# Patient Record
Sex: Female | Born: 1935
Health system: Southern US, Community
[De-identification: ages and names within clinical notes are randomized; demographics above are authoritative.]

## PROBLEM LIST (undated history)

## (undated) DIAGNOSIS — G9341 Metabolic encephalopathy: Secondary | ICD-10-CM

## (undated) DIAGNOSIS — G4700 Insomnia, unspecified: Secondary | ICD-10-CM

## (undated) DIAGNOSIS — E785 Hyperlipidemia, unspecified: Secondary | ICD-10-CM

## (undated) DIAGNOSIS — G309 Alzheimer's disease, unspecified: Secondary | ICD-10-CM

## (undated) DIAGNOSIS — N938 Other specified abnormal uterine and vaginal bleeding: Secondary | ICD-10-CM

## (undated) DIAGNOSIS — R531 Weakness: Secondary | ICD-10-CM

## (undated) DIAGNOSIS — N179 Acute kidney failure, unspecified: Secondary | ICD-10-CM

## (undated) DIAGNOSIS — N200 Calculus of kidney: Secondary | ICD-10-CM

## (undated) DIAGNOSIS — I10 Essential (primary) hypertension: Secondary | ICD-10-CM

## (undated) DIAGNOSIS — F0390 Unspecified dementia without behavioral disturbance: Secondary | ICD-10-CM

## (undated) DIAGNOSIS — D126 Benign neoplasm of colon, unspecified: Secondary | ICD-10-CM

## (undated) DIAGNOSIS — F0281 Dementia in other diseases classified elsewhere with behavioral disturbance: Secondary | ICD-10-CM

## (undated) DIAGNOSIS — E559 Vitamin D deficiency, unspecified: Secondary | ICD-10-CM

## (undated) DIAGNOSIS — D509 Iron deficiency anemia, unspecified: Secondary | ICD-10-CM

## (undated) DIAGNOSIS — F329 Major depressive disorder, single episode, unspecified: Secondary | ICD-10-CM

## (undated) DIAGNOSIS — E7800 Pure hypercholesterolemia, unspecified: Secondary | ICD-10-CM

## (undated) DIAGNOSIS — E079 Disorder of thyroid, unspecified: Secondary | ICD-10-CM

## (undated) DIAGNOSIS — G4730 Sleep apnea, unspecified: Secondary | ICD-10-CM

## (undated) DIAGNOSIS — F418 Other specified anxiety disorders: Secondary | ICD-10-CM

## (undated) DIAGNOSIS — G9340 Encephalopathy, unspecified: Secondary | ICD-10-CM

## (undated) HISTORY — DX: Major depressive disorder, single episode, unspecified: F32.9

## (undated) HISTORY — DX: Essential (primary) hypertension: I10

## (undated) HISTORY — DX: Benign neoplasm of colon, unspecified: D12.6

## (undated) HISTORY — DX: Unspecified dementia, unspecified severity, without behavioral disturbance, psychotic disturbance, mood disturbance, and anxiety: F03.90

## (undated) HISTORY — PX: APPENDECTOMY: SHX54

## (undated) HISTORY — DX: Pure hypercholesterolemia, unspecified: E78.00

## (undated) HISTORY — DX: Metabolic encephalopathy: G93.41

## (undated) HISTORY — DX: Vitamin D deficiency, unspecified: E55.9

## (undated) HISTORY — PX: CHOLECYSTECTOMY: SHX55

## (undated) HISTORY — PX: ABDOMINAL HYSTERECTOMY: SHX81

## (undated) HISTORY — DX: Encephalopathy, unspecified: G93.40

## (undated) HISTORY — DX: Other specified abnormal uterine and vaginal bleeding: N93.8

## (undated) HISTORY — DX: Dementia in other diseases classified elsewhere with behavioral disturbance: F02.81

## (undated) HISTORY — DX: Sleep apnea, unspecified: G47.30

## (undated) HISTORY — DX: Weakness: R53.1

## (undated) HISTORY — DX: Insomnia, unspecified: G47.00

## (undated) HISTORY — DX: Other specified anxiety disorders: F41.8

## (undated) HISTORY — DX: Disorder of thyroid, unspecified: E07.9

## (undated) HISTORY — DX: Hyperlipidemia, unspecified: E78.5

## (undated) HISTORY — PX: OTHER SURGICAL HISTORY: SHX169

## (undated) HISTORY — DX: Iron deficiency anemia, unspecified: D50.9

## (undated) HISTORY — DX: Acute kidney failure, unspecified: N17.9

## (undated) HISTORY — DX: Alzheimer's disease, unspecified: G30.9

## (undated) HISTORY — DX: Calculus of kidney: N20.0

---

## 1997-08-19 ENCOUNTER — Other Ambulatory Visit: Admission: RE | Admit: 1997-08-19 | Discharge: 1997-08-19 | Payer: Self-pay | Admitting: Obstetrics and Gynecology

## 1997-11-11 ENCOUNTER — Other Ambulatory Visit: Admission: RE | Admit: 1997-11-11 | Discharge: 1997-11-11 | Payer: Self-pay | Admitting: Obstetrics and Gynecology

## 1998-11-15 ENCOUNTER — Other Ambulatory Visit: Admission: RE | Admit: 1998-11-15 | Discharge: 1998-11-15 | Payer: Self-pay | Admitting: Obstetrics and Gynecology

## 1999-07-25 ENCOUNTER — Ambulatory Visit (HOSPITAL_COMMUNITY): Admission: RE | Admit: 1999-07-25 | Discharge: 1999-07-25 | Payer: Self-pay | Admitting: Gastroenterology

## 1999-10-23 ENCOUNTER — Encounter: Payer: Self-pay | Admitting: Obstetrics and Gynecology

## 1999-10-23 ENCOUNTER — Encounter: Admission: RE | Admit: 1999-10-23 | Discharge: 1999-10-23 | Payer: Self-pay | Admitting: Obstetrics and Gynecology

## 1999-11-15 ENCOUNTER — Other Ambulatory Visit: Admission: RE | Admit: 1999-11-15 | Discharge: 1999-11-15 | Payer: Self-pay | Admitting: Obstetrics and Gynecology

## 2000-10-06 ENCOUNTER — Emergency Department (HOSPITAL_COMMUNITY): Admission: EM | Admit: 2000-10-06 | Discharge: 2000-10-07 | Payer: Self-pay | Admitting: Emergency Medicine

## 2000-10-07 ENCOUNTER — Emergency Department (HOSPITAL_COMMUNITY): Admission: EM | Admit: 2000-10-07 | Discharge: 2000-10-07 | Payer: Self-pay | Admitting: Emergency Medicine

## 2000-10-07 ENCOUNTER — Encounter: Payer: Self-pay | Admitting: Emergency Medicine

## 2000-10-07 ENCOUNTER — Encounter: Payer: Self-pay | Admitting: Internal Medicine

## 2000-10-24 ENCOUNTER — Encounter: Admission: RE | Admit: 2000-10-24 | Discharge: 2000-10-24 | Payer: Self-pay | Admitting: Obstetrics and Gynecology

## 2000-10-24 ENCOUNTER — Encounter: Payer: Self-pay | Admitting: Obstetrics and Gynecology

## 2000-11-18 ENCOUNTER — Other Ambulatory Visit: Admission: RE | Admit: 2000-11-18 | Discharge: 2000-11-18 | Payer: Self-pay | Admitting: Obstetrics and Gynecology

## 2001-10-06 ENCOUNTER — Encounter: Payer: Self-pay | Admitting: Obstetrics and Gynecology

## 2001-10-06 ENCOUNTER — Encounter: Admission: RE | Admit: 2001-10-06 | Discharge: 2001-10-06 | Payer: Self-pay | Admitting: Obstetrics and Gynecology

## 2001-11-18 ENCOUNTER — Other Ambulatory Visit: Admission: RE | Admit: 2001-11-18 | Discharge: 2001-11-18 | Payer: Self-pay | Admitting: Obstetrics and Gynecology

## 2002-06-28 ENCOUNTER — Encounter: Payer: Self-pay | Admitting: Emergency Medicine

## 2002-06-28 ENCOUNTER — Emergency Department (HOSPITAL_COMMUNITY): Admission: EM | Admit: 2002-06-28 | Discharge: 2002-06-28 | Payer: Self-pay | Admitting: Emergency Medicine

## 2002-10-11 ENCOUNTER — Encounter: Admission: RE | Admit: 2002-10-11 | Discharge: 2002-10-11 | Payer: Self-pay | Admitting: Obstetrics and Gynecology

## 2002-10-11 ENCOUNTER — Encounter: Payer: Self-pay | Admitting: Obstetrics and Gynecology

## 2003-01-24 ENCOUNTER — Inpatient Hospital Stay (HOSPITAL_COMMUNITY): Admission: EM | Admit: 2003-01-24 | Discharge: 2003-01-26 | Payer: Self-pay | Admitting: Emergency Medicine

## 2003-04-22 ENCOUNTER — Emergency Department (HOSPITAL_COMMUNITY): Admission: EM | Admit: 2003-04-22 | Discharge: 2003-04-22 | Payer: Self-pay | Admitting: Emergency Medicine

## 2003-08-31 ENCOUNTER — Ambulatory Visit (HOSPITAL_COMMUNITY): Admission: RE | Admit: 2003-08-31 | Discharge: 2003-08-31 | Payer: Self-pay | Admitting: Gastroenterology

## 2003-10-13 ENCOUNTER — Ambulatory Visit (HOSPITAL_COMMUNITY): Admission: RE | Admit: 2003-10-13 | Discharge: 2003-10-13 | Payer: Self-pay | Admitting: Obstetrics and Gynecology

## 2003-11-23 ENCOUNTER — Other Ambulatory Visit: Admission: RE | Admit: 2003-11-23 | Discharge: 2003-11-23 | Payer: Self-pay | Admitting: Obstetrics and Gynecology

## 2004-06-28 ENCOUNTER — Encounter: Admission: RE | Admit: 2004-06-28 | Discharge: 2004-06-28 | Payer: Self-pay | Admitting: Gastroenterology

## 2004-07-04 ENCOUNTER — Encounter: Admission: RE | Admit: 2004-07-04 | Discharge: 2004-07-04 | Payer: Self-pay | Admitting: Gastroenterology

## 2004-07-13 ENCOUNTER — Encounter (INDEPENDENT_AMBULATORY_CARE_PROVIDER_SITE_OTHER): Payer: Self-pay | Admitting: Specialist

## 2004-07-13 ENCOUNTER — Ambulatory Visit (HOSPITAL_COMMUNITY): Admission: RE | Admit: 2004-07-13 | Discharge: 2004-07-13 | Payer: Self-pay | Admitting: Gastroenterology

## 2004-07-16 ENCOUNTER — Ambulatory Visit (HOSPITAL_COMMUNITY): Admission: RE | Admit: 2004-07-16 | Discharge: 2004-07-16 | Payer: Self-pay | Admitting: Gastroenterology

## 2004-07-31 ENCOUNTER — Inpatient Hospital Stay (HOSPITAL_COMMUNITY): Admission: EM | Admit: 2004-07-31 | Discharge: 2004-08-02 | Payer: Self-pay | Admitting: Emergency Medicine

## 2004-10-30 ENCOUNTER — Encounter: Admission: RE | Admit: 2004-10-30 | Discharge: 2004-10-30 | Payer: Self-pay | Admitting: Obstetrics and Gynecology

## 2004-12-27 ENCOUNTER — Inpatient Hospital Stay (HOSPITAL_COMMUNITY): Admission: AD | Admit: 2004-12-27 | Discharge: 2004-12-29 | Payer: Self-pay | Admitting: Gastroenterology

## 2005-01-23 ENCOUNTER — Inpatient Hospital Stay (HOSPITAL_COMMUNITY): Admission: EM | Admit: 2005-01-23 | Discharge: 2005-01-25 | Payer: Self-pay | Admitting: Emergency Medicine

## 2005-06-07 ENCOUNTER — Inpatient Hospital Stay (HOSPITAL_COMMUNITY): Admission: AD | Admit: 2005-06-07 | Discharge: 2005-06-16 | Payer: Self-pay | Admitting: Endocrinology

## 2005-06-16 ENCOUNTER — Ambulatory Visit: Payer: Self-pay | Admitting: Infectious Diseases

## 2005-11-01 ENCOUNTER — Encounter: Admission: RE | Admit: 2005-11-01 | Discharge: 2005-11-01 | Payer: Self-pay | Admitting: Obstetrics and Gynecology

## 2005-11-27 ENCOUNTER — Other Ambulatory Visit: Admission: RE | Admit: 2005-11-27 | Discharge: 2005-11-27 | Payer: Self-pay | Admitting: Obstetrics and Gynecology

## 2006-04-03 ENCOUNTER — Ambulatory Visit: Payer: Self-pay | Admitting: Internal Medicine

## 2006-10-10 ENCOUNTER — Ambulatory Visit (HOSPITAL_COMMUNITY): Admission: RE | Admit: 2006-10-10 | Discharge: 2006-10-10 | Payer: Self-pay | Admitting: Gastroenterology

## 2006-11-04 ENCOUNTER — Encounter: Admission: RE | Admit: 2006-11-04 | Discharge: 2006-11-04 | Payer: Self-pay | Admitting: Endocrinology

## 2006-11-07 ENCOUNTER — Encounter: Admission: RE | Admit: 2006-11-07 | Discharge: 2006-11-07 | Payer: Self-pay | Admitting: Endocrinology

## 2007-12-01 ENCOUNTER — Ambulatory Visit: Payer: Self-pay | Admitting: Obstetrics and Gynecology

## 2008-05-04 ENCOUNTER — Encounter: Payer: Self-pay | Admitting: Obstetrics and Gynecology

## 2008-05-04 ENCOUNTER — Other Ambulatory Visit: Admission: RE | Admit: 2008-05-04 | Discharge: 2008-05-04 | Payer: Self-pay | Admitting: Obstetrics and Gynecology

## 2008-05-04 ENCOUNTER — Ambulatory Visit: Payer: Self-pay | Admitting: Obstetrics and Gynecology

## 2008-11-23 ENCOUNTER — Ambulatory Visit: Payer: Self-pay | Admitting: Obstetrics and Gynecology

## 2009-05-08 ENCOUNTER — Ambulatory Visit: Payer: Self-pay | Admitting: Obstetrics and Gynecology

## 2010-02-18 ENCOUNTER — Encounter: Payer: Self-pay | Admitting: Obstetrics and Gynecology

## 2010-05-10 ENCOUNTER — Other Ambulatory Visit: Payer: Self-pay | Admitting: Obstetrics and Gynecology

## 2010-05-10 ENCOUNTER — Other Ambulatory Visit (HOSPITAL_COMMUNITY)
Admission: RE | Admit: 2010-05-10 | Discharge: 2010-05-10 | Disposition: A | Discharge status: Home / Self Care | Payer: Medicare Other | Source: Ambulatory Visit | Attending: Obstetrics and Gynecology | Admitting: Obstetrics and Gynecology

## 2010-05-10 ENCOUNTER — Encounter (INDEPENDENT_AMBULATORY_CARE_PROVIDER_SITE_OTHER): Payer: Medicare Other | Admitting: Obstetrics and Gynecology

## 2010-05-10 ENCOUNTER — Encounter: Payer: Self-pay | Admitting: Obstetrics and Gynecology

## 2010-05-10 DIAGNOSIS — Z124 Encounter for screening for malignant neoplasm of cervix: Secondary | ICD-10-CM

## 2010-05-10 DIAGNOSIS — R82998 Other abnormal findings in urine: Secondary | ICD-10-CM

## 2010-05-10 DIAGNOSIS — N952 Postmenopausal atrophic vaginitis: Secondary | ICD-10-CM

## 2010-05-10 DIAGNOSIS — N951 Menopausal and female climacteric states: Secondary | ICD-10-CM

## 2010-05-10 DIAGNOSIS — N6009 Solitary cyst of unspecified breast: Secondary | ICD-10-CM

## 2010-05-11 ENCOUNTER — Other Ambulatory Visit: Payer: Self-pay | Admitting: Obstetrics and Gynecology

## 2010-05-11 DIAGNOSIS — Z1231 Encounter for screening mammogram for malignant neoplasm of breast: Secondary | ICD-10-CM

## 2010-05-16 ENCOUNTER — Ambulatory Visit
Admission: RE | Admit: 2010-05-16 | Discharge: 2010-05-16 | Disposition: A | Discharge status: Home / Self Care | Payer: Medicare Other | Source: Ambulatory Visit | Attending: Obstetrics and Gynecology | Admitting: Obstetrics and Gynecology

## 2010-05-16 DIAGNOSIS — Z1231 Encounter for screening mammogram for malignant neoplasm of breast: Secondary | ICD-10-CM

## 2010-05-17 ENCOUNTER — Other Ambulatory Visit: Payer: Self-pay | Admitting: Obstetrics and Gynecology

## 2010-05-17 DIAGNOSIS — R928 Other abnormal and inconclusive findings on diagnostic imaging of breast: Secondary | ICD-10-CM

## 2010-05-22 ENCOUNTER — Ambulatory Visit
Admission: RE | Admit: 2010-05-22 | Discharge: 2010-05-22 | Disposition: A | Discharge status: Home / Self Care | Payer: Medicare Other | Source: Ambulatory Visit | Attending: Obstetrics and Gynecology | Admitting: Obstetrics and Gynecology

## 2010-05-22 DIAGNOSIS — R928 Other abnormal and inconclusive findings on diagnostic imaging of breast: Secondary | ICD-10-CM

## 2010-06-12 NOTE — Op Note (Signed)
[name], Brianna [name]               ACCOUNT NO.:  [id number]   MEDICAL RECORD NO.:  [id number]          PATIENT TYPE:  AMB   LOCATION:  ENDO                         FACILITY:  [facility]   PHYSICIAN:  Anselmo Rod, M.D.  DATE OF BIRTH:  Aug 15, 1932   DATE OF PROCEDURE:  10/10/2006  DATE OF DISCHARGE:                               OPERATIVE REPORT   PROCEDURE PERFORMED:  Screening colonoscopy.   ENDOSCOPIST:  Anselmo Rod, M.D.   INSTRUMENT USED:  Pentax video colonoscope.   INDICATIONS FOR PROCEDURE:  77 year old white female with a history of  adenomatous polyps removed in the past undergoing repeat colonoscopy to  rule out recurrent polyps.   PREPROCEDURE PREPARATION:  Informed consent was procured from the  patient.  The patient fasted for 8 hours prior to the procedure and  prepped with a bottle of magnesium citrate and a gallon of NuLytely the  night prior to the procedure.  The risks and benefits of the procedure  including a 10% miss rate of cancer and polyp were discussed with the  patient's family, as well.   PREPROCEDURE PHYSICAL:  The patient had stable vital signs.  Neck  supple.  Chest clear to auscultation.  S1 and S2 regular.  Abdomen soft  with normal bowel sounds.   DESCRIPTION OF PROCEDURE:  The patient was placed in the left lateral  decubitus position and sedated with 75 mcg of Fentanyl and 6 mg of  Versed given intravenously in slow incremental doses.  Once the patient  was adequately sedated and maintained on low flow oxygen and continuous  cardiac monitoring, the Pentax video colonoscope was advanced from the  rectum to the anastomosis where the patient had a right hemicolectomy in  the past. Sigmoid diverticulosis was noted.  There was some residual  stool in the colon.  Multiple washes were done. Internal hemorrhoids  were seen on retroflexion. The distal small bowel appeared normal.  No  masses or polyps were identified.  The patient tolerated the  procedure  well without complication.   IMPRESSION:  1. Small internal hemorrhoids.  2. Sigmoid diverticulosis.  3. Patient status post right hemicolectomy.  4. Significant amount of residual stool in the colon.  Small lesions      could have been missed.   RECOMMENDATIONS.:  1. Continue liberal fluid intake with meals.  2. Repeat colonoscopy is recommend in the next five years.  Further      recommendation to be made in follow-up.      Anselmo Rod, M.D.  Electronically Signed     JNM/MEDQ  D:  10/10/2006  T:  10/11/2006  Job:  [id number]   cc:   Jeannett Senior A. Evlyn Kanner, M.D.  Fax: [phone]

## 2010-06-15 NOTE — H&P (Signed)
NAME:  Brianna [name], Brianna [name]                   ACCOUNT NO.:  mk   MEDICAL RECORD NO.:  [id number]          PATIENT TYPE:  EMS   LOCATION:  ED                           FACILITY:  [facility]   PHYSICIAN:  Barry Dienes. Eloise Harman, M.D.DATE OF BIRTH:  1932-06-30   DATE OF ADMISSION:  07/31/2004  DATE OF DISCHARGE:                                HISTORY & PHYSICAL   CHIEF COMPLAINT:  Severe diarrhea.   HISTORY OF PRESENT ILLNESS:  The patient is a 77 year old white female with  a long history of GI problems.  In June 2006, she had a work-up for nausea  which showed markedly delayed gastric emptying and Helicobacter pylori  serology positivity.  She started antibiotic treatment last week for the  Helicobacter pylori.  Approximately five days ago she awoke with some  confusion.  She has also complained of bilateral ankle edema without  shortness of breath.  After recently being started on Sular on July 3, she  was started on a diuretic.  Later on July 3, she began to have multiple  episodes of vomiting and diarrhea without fever.  Today she has had profuse  watery diarrhea every few minutes.  She has been treated with IV fluids and  Zofran 4 mg IV by emergency room staff.  Currently she complains of a dry  cough and no longer has any nausea.  She continues to have loose bowel  movements every few minutes.   PAST MEDICAL HISTORY:  1.  In 2006, upper GI studies showed esophageal dysmotility.  2.  She also had a July 13, 2004 EGD that showed antral gastritis.  3.  A June 19 gastric emptying study showed markedly delayed gastric      emptying with 85% of activity present at two hours.  4.  An August 2005 colonoscopy showed minimal diverticulosis with internal      hemorrhoids.  5.  She is status post approximately 1998 right hemicolectomy for a large      villous adenoma.  6.  In December 2004, she had a partial small bowel obstruction and      apparently has had multiple minor bouts of partial small  bowel      obstruction.  7.  She also has hypothyroidism, hyperlipidemia and hypertension.   MEDICATIONS PRIOR TO ADMISSION:  1.  Effexor 75 mg p.o. daily.  2.  Synthroid 175 mcg p.o. daily.  3.  Diovan 320 mg p.o. daily.  4.  Nexium 40 mg p.o. daily.  5.  Hydrochlorothiazide 12.5 mg p.o. q.o.d.  6.  Phenergan 25 mg p.o. daily p.r.n. nausea.  7.  Sular 10 mg p.o. daily.  8.  Vytorin 10/80 one tablet p.o. b.i.d.  9.  Reglan p.r.n. nausea.   PAST SURGICAL HISTORY:  1.  Remote laparoscopic cholecystectomy.  2.  Total abdominal hysterectomy and bilateral salpingo-oophorectomy.  3.  Right renal calculus, open removal.  4.  In 1998, right hemicolectomy.   ALLERGIES:  1.  PENICILLIN has been associated with edema.  2.  ACE INHIBITORS have been associated with dry cough.  FAMILY HISTORY:  Father died at age 11 of complications of COPD.  Mother  died at age 58 of a stroke.  She also had diabetes mellitus, type 2.  She  had six brothers and sisters who have passed away.  Two had lung cancer and  one had diabetes mellitus.  There is no family history of close relatives  with colon cancer or breast cancer.  There is a family history of premature  cardiovascular disease.   SOCIAL HISTORY:  She is married.  She has three daughters and one adopted  grandson.  Her daughter lives next door.  She has no history of tobacco or  alcohol abuse.   REVIEW OF SYSTEMS:  She has a dry cough today.  She has had some nausea and  vomiting and severe diarrhea.  She denies recent fever or chills, dysuria,  frequency.  She has osteoarthritis that affects her hands, knees and ankles.  Currently she is without symptoms of depression or anxiety.   PHYSICAL EXAMINATION:   REVIEW OF SYSTEMS:  She is chronically bed bound and has a significant fear  of falling although she has had no recent falls with injury.  She has  bilateral knee osteoarthritis pain.  She has chronic intermittent oozing  from her left  thigh ulcerated varicose veins.  She is an oxygen requiring  person with COPD.  She has no recent substernal chest pain, nausea,  vomiting, constipation, or fever.  She is currently without significant  depression or anxiety.   PHYSICAL EXAMINATION:  VITAL SIGNS:  Blood pressure 132/63, pulse 116,  respirations 20, temperature 99.5, pulse oximetry 95% on room air.  GENERAL:  She is an overweight white female who had a dry cough while lying  at 10-degree elevation of the head of the bed.  HEENT:  Significant for bilateral cerumen impaction.  NECK:  Supple without JVD or carotid bruits.  CHEST:  Clear to auscultation.  HEART:  Regular rate and rhythm without murmur or gallop.  ABDOMEN:  Normal bowel sounds with no hepatosplenomegaly or tenderness.  There are well-healed surgical scars in the right flank and along the entire  midline.  EXTREMITIES:  Without cyanosis, clubbing, or edema.  NEUROLOGIC:  Nonfocal.   LABORATORY DATA:  CT scan of the abdomen and pelvis with oral and IV  contrast showed the following:  1.  Fatty liver.  2.  Unchanged small cyst of the right kidney.  3.  Minimally prominent loops of jejunum in the left upper quadrant with      normal dimension distal small bowel loops.  4.  Contrast does pass into the distal ileum.  There is no contrast seen in      the colon.   White blood cell count was 17, hemoglobin 12, hematocrit 37.5, 13.6  neutrophils, platelets 340.  Serum sodium 134, potassium 4.1, chloride 97,  CO2 23, BUN 12, creatinine 1.1, glucose 133.  Stool hemoccult test was  negative.   IMPRESSION AND PLAN:  1.  Diarrhea:  This is likely secondary to Clostridium difficile colitis      given her recent antibiotic treatment and elevated white blood cell      count and low grade fever.  A viral syndrome is somewhat less likely.  I     plan to continue IV fluids at a moderate rate and empiric treatment with      Flagyl 250 mg four times daily for 14 days.   Clostridium difficile stool  test has already been sent from the emergency room.  2.  Hypertension:  Well controlled despite no medications for the past two      days due to her nausea and diarrhea.  We will restart her Diovan and      hold her Sular for now due to her relatively normal blood pressure.  3.  Gastroparesis:  Moderate by recent gastric emptying study.  I plan to      have Reglan restarted when her diarrhea subsides.       DGP/MEDQ  D:  07/31/2004  T:  07/31/2004  Job:  [id number]   cc:   Jeannett Senior A. Evlyn Kanner, M.D.  [address]  [place]  [place] [address]  Fax: [phone]   Anselmo Rod, M.D.  [address]  [address]  [place]  [place] [address]  Fax: [phone]   Rande Brunt. Eda Paschal, M.D.  [address], [address]  [place]  [place] [address]  Fax: [phone]

## 2010-06-15 NOTE — Discharge Summary (Signed)
Brianna [name], Brianna [name]               ACCOUNT NO.:  [id number]   MEDICAL RECORD NO.:  [id number]          PATIENT TYPE:  INP   LOCATION:  1507                         FACILITY:  [facility]   PHYSICIAN:  Tera Mater. Evlyn Kanner, M.D. DATE OF BIRTH:  April 12, 1932   DATE OF ADMISSION:  07/31/2004  DATE OF DISCHARGE:  08/02/2004                                 DISCHARGE SUMMARY   DISCHARGE DIAGNOSES:  1.  Profuse diarrhea with volume depletion, clinically improved with      negative Clostridium difficile.  2.  Recent finding of gastritis still with some symptoms.  3.  Gastric emptying delay.  4.  History of diverticular disease.  5.  Prior small bowel obstruction.  6.  Hypothyroidism.  7.  Hypertension.  8.  Hyperlipidemia.  9.  Depression.   CONSULTATIONS:  Dr. Anselmo Rod for GI.   PROCEDURES:  None.   HISTORY OF PRESENT ILLNESS:  Brianna [name] is a 77 year old white female with  a long history of multiple small bowel obstruction and difficulties with her  bowels, who presented initially with nausea and vomiting and then profuse  diarrhea.  She had recently been treated for H. pylori and there was a worry  that we might have had an upset in the bowel with colonic bacteria that  caused Clostridium difficile to predominant.  Fortunately this has not been  show to be true.  Initially she did have some fever and leukocytosis,  therefore requiring admission.  The patient has actually done quite well  under observation.  With hydration and limitation of diet plus Citrucel for  bulking agent, she has done quite well.  She has had her Reglan held, which  has also helped, and was given some Simethicone.  At the present time her  bowels have become more formed.  Her last diarrheal stool was yesterday  morning and she has had no fever overnight.  She is now discharged home in  improved condition.   DIET:  As before with no significant limitations.   MEDICATIONS AT DISCHARGE:  Will include:  1.   Over-the-counter Simethicone on a p.r.n. basis.  2.  Citrucel at least one packet a day.  She is to resume her other medications which include:  1.  Effexor XR 75 daily.  2.  Synthroid 175 mcg daily.  3.  Diovan 320 daily.  4.  Nexium 40 daily.  5.  HCTZ 12.5 every other day.  6.  Phenergan p.r.n.  7.  Sular 10 daily.  8.  Vytorin 10/80 one daily.   FOLLOW UP:  With me in two-three weeks.  She is to see Dr. Loreta Ave as discussed  with her.  She is to call if she gets other diarrheal problems.   LABORATORY REVIEW:  This morning white count is 8200, hemoglobin 10.3,  platelets 270,000, MCV 87.6.  Chemistries today sodium 138, potassium 4.2,  chloride 106, CO2 of 25, BUN 4, creatinine 0.9, glucose 110.  Total  bilirubin is 0.5, alkaline phosphatase is 48, SGOT 40, SGPT 31, total  protein 6.0, albumin 2.8, calcium 8.8.  Culture of the  blood yesterday was  negative.  At presentation sodium 134, potassium 4.1, chloride 97, CO2 of  23, BUN 12, creatinine 1.1, glucose 133.  Initial white count was 17,300,  hemoglobin 12.7, platelets 346,000.  A C. difficile on July 31, 2004 was  negative.  Stool culture is pending.  Radiology testing on August 01, 2004 a  chest x-ray showed some hyper aeration of the lung bases and an acute  abdomen with no evidence of obstruction or perforation.  A CT of the abdomen  done in the ER on July 31, 2004 showed dilated small bowel loops in the left  upper quadrant, which could represent a focal ileus or partial small bowel  obstruction, though contrast does pass beyond the normal caliber of the  distal small loops.  Mild fatty infiltration of the liver.  A CT of the  pelvis with no acute pelvic abnormalities were noted.   SUMMARY:  We have a 77 year old white female with a complicated GI history  presenting with initially nausea and vomiting and then onto a diarrheal  illness.  She had profuse watery diarrhea that has now resolved.  This is  probably viral in nature and  not related to bacterial overgrowth or other  difficulties.  Treatment is noted as above.       SAS/MEDQ  D:  08/02/2004  T:  08/02/2004  Job:  [id number]

## 2010-06-15 NOTE — Discharge Summary (Signed)
Brianna [name], Brianna [name]               ACCOUNT NO.:  [id number]   MEDICAL RECORD NO.:  [id number]          PATIENT TYPE:  INP   LOCATION:  5740                         FACILITY:  [facility]   PHYSICIAN:  Tera Mater. Evlyn Kanner, M.D. DATE OF BIRTH:  04/25/1932   DATE OF ADMISSION:  12/27/2004  DATE OF DISCHARGE:  12/29/2004                                 DISCHARGE SUMMARY   DISCHARGE DIAGNOSES:  1.  Nausea, vomiting, gastroparesis and dehydration with hypokalemia,      hyponatremia.  2.  Hypertension.  3.  Hyperlipidemia.  4.  Hypothyroidism.   CONSULTATIONS:  None.   PROCEDURES:  There were no procedures.   [name] is a 77 year old white female, a patient of my practice for many  years.  She has had recurrent problems with GI issues, primarily  gastroparesis.  She had recently undergone evaluation by Dr. Loreta Ave.  She  presented to see Dr. Loreta Ave as an outpatient and had electrolyte disturbances  as well as continued poor p.o. intake and was brought to the hospital for  stabilization.  She just had a CT of the abdomen.  This was not repeated.  She has had an upper GI, and this was not repeated.  The patient was  initially given gentle hydration.  Electrolytes were rechecked and the  patient came back into balance relatively easily.  Diet was slowly advanced  and Reglan was given on a regular basis.  The patient has had trouble  tolerating this on an outpatient basis.  Proton pump inhibitors also were  continued.  The patient did improve with the treatment of gastroparesis.  It  was emphasized that this was a cornerstone of her therapy and we recommended  continuing this therapy on a regular basis.  The patient exhibited good  understanding and was back to a relatively stable baseline and was  discharged by my partner, Dr. Clelia Croft.   LABORATORY DATA:  White count initially was 13,000, hemoglobin 12.2, MCV  85.6, platelets 334,000.  White count on the 1st was 9.3, hemoglobin 11.8,  platelets  232,000.  Sodium was initially 124, potassium 3, chloride 83, CO2  29, BUN 6, creatinine 0.8, glucose 118, calcium 9.5.  A repeat on the 2nd,  sodium 131, potassium 3.2, chloride 94, CO2 29, BUN 5, creatinine 0.8,  glucose 89.  Liver function tests were normal with an AST of 23, ALT of 19,  alkaline phosphatase 78, total bilirubin 0.7.  Radiology reports do not  appear to have caught up with the chart yet.   In summary, we have a 77 year old white female with known gastroparesis in  the setting of exacerbation with electrolyte disturbances.  Fortunately, she  got better with fluids and conservative therapy.   DISCHARGE MEDICATIONS:  1.  Reglan 10 mg before each meal and at bedtime.  2.  Metamucil daily.  3.  Synthroid 175 mcg daily.  4.  Sular 10 mg daily.  5.  Nexium 40 mg daily.  6.  Diovan/HCT 320/12.5 mg.  7.  Vytorin now has been stopped.  8.  Effexor XR 75 mg daily.  9.  Phenergan as needed.  10. Potassium 20 mEq twice a day.   She was to see me in a week to 10 days.  She had an appointment with Dr.  Loreta Ave on Tuesday, December 5.  She is to call if she has recurrent trouble.           ______________________________  Tera Mater. Evlyn Kanner, M.D.     SAS/MEDQ  D:  02/12/2005  T:  02/12/2005  Job:  [id number]

## 2010-06-15 NOTE — Discharge Summary (Signed)
NAME:  Brianna [name], Brianna [name]                         ACCOUNT NO.:  [id number]   MEDICAL RECORD NO.:  [id number]                   PATIENT TYPE:  INP   LOCATION:  5734                                 FACILITY:  [facility]   PHYSICIAN:  Tera Mater. Evlyn Kanner, M.D.              DATE OF BIRTH:  1932-04-18   DATE OF ADMISSION:  01/24/2003  DATE OF DISCHARGE:  01/26/2003                                 DISCHARGE SUMMARY   DISCHARGE DIAGNOSES:  1. Partial small-bowel obstruction, without mechanical obstruction,     clinically improved.  2. Primary hypothyroidism.  3. Hyperlipidemia.  4. Hypertension.  5. Prior multiple abdominal surgeries.   PROCEDURE:  CT of the abdomen and pelvis and an upper GI series with small-  bowel follow through.   HISTORY:  Ms. Shibuya is a 77 year old white female with prior partial colon  resection for benign reasons, prior hysterectomy, cholecystectomy, and  surgical repair of renal calculi. She presented with nausea, vomiting, and a  clinical picture consistent with small-bowel obstruction. Fortunately, this  has resolved with fairly noninterventional methods with bowel rest, slow  progression back with diet, and she is now clinically much improved. She has  not had any evidence of infectious cause of this. There is no evidence of  perforation. The small-bowel follow through does not show a transition area  suggesting an area that needs anything surgical. The patient has settled  back to her clinical baseline. She is tolerating her diet. Bowels have  worked. She has had no fever. She did initially have some mild leukocytosis  that has completely normalized. Electrolytes have stayed in good control.  She has had no decompensation of pulmonary, cardiac, or other systems.  Overall, she is markedly improved and ready for discharge home.   LABORATORY DATA:  White count started at 21,700 with a hemoglobin of 15.1,  MCV of 87.0, platelet count 360,000; white count normalized to  7,800 with  hemoglobin of 11.3, MCV of 88.2, platelet count 259,000. Initial chemistries  showed sodium 133, potassium 4.7, chloride 100, BUN 20, creatinine 1.3,  glucose 166. This normalized to sodium 137, potassium 3.5, chloride 108, CO2  26, BUN 12, creatinine 0.9, glucose 94. Liver function testing was normal  with a total bilirubin of 0.7, direct bilirubin of 0.2, indirect bilirubin  of 0.5, alkaline phosphatase 65, SGOT 32, SGPT 26, total protein 8.4,  albumin 4.2, calcium 8.0. Urinalysis shows specific gravity of 1.034 with a  pH of 6, otherwise negative. Radiology testing included an upper GI with  small-bowel follow through with the double contrasting image of the stomach  was unremarkable; duodenal bulb was normal position without stricture,  ulceration, or full thickening; C loop of the duodenum had normal  configuration, nonspecific esophageal motility disorder was noted; otherwise  unremarkable upper GI. The distal proximal small bowel loops and the jejunum  with decompressed distal loops were seen. There was dilated proximal small  loops in the jejunum with decompressed distal loops seen. There was evidence  of adhesions in the left upper quadrant although the discrete transition  zone cannot be identified. The imaging features are compatible with partial  small-bowel obstruction, and the contrast passed readily to the colon in a  patient status post right hemicolectomy. CT of the abdomen and the pelvis on  December 27 showed mild partial small-bowel obstruction; cause is not seen.  There was a 9-mm simple right renal cyst, and she was status post  cholecystectomy. The pelvis showed sigmoid diverticulosis without  diverticulitis.   SUMMARY:  In summary, we have a 77 year old white female with multiple prior  GI surgeries presenting now with a partial small-bowel obstruction. She has  resolved nicely with basic bowel rest and is ready for discharge home. She  does have a risk  of recurrence due to the presence of adhesions, but she is  presently doing well. Medications will be to resume her Zocor plus Zetia-40  and 10 mg respectively, her Levothroid at 175 mcg daily, Diovan 320 daily,  and her hormone shots from Dr. Eda Paschal. She is to follow up with [place] in two  to three weeks. She is to call if she has recurrent nausea, vomiting, or  fever. She will not be on antibiotics. No dietary restrictions are noted. No  pain control is required.                                                Tera Mater. Evlyn Kanner, M.D.    SAS/MEDQ  D:  01/26/2003  T:  01/26/2003  Job:  [id number]

## 2010-06-15 NOTE — Assessment & Plan Note (Signed)
Brianna [name]                         GASTROENTEROLOGY OFFICE NOTE   Brianna [name], Brianna [name]                      MRN:          [id number]  DATE:04/03/2006                            DOB:          05-Aug-1932    REASON FOR CONSULTATION:  Nausea.   This is a pleasant 77 year old white female with a history of  hypertension, hyperlipidemia, hypothyroidism, depression,  osteoarthritis, sleep apnea, kidney stones, obesity, and multiple prior  surgeries, who presents today regarding chronic nausea.  The patient has  had this problem for years, and has had extensive GI evaluation with Dr.  Charna Elizabeth.  Dr. Loreta Ave has performed colonoscopy in 2001, and again in  2005.  Upper endoscopy was performed in 2006, and found to be  unrevealing.  She has an abnormal gastric emptying scan consistent with  gastroparesis.  She failed standard therapies, and thus, was referred to  Dr. Alycia Rossetti regarding possible other treatments including gastric pacing  and / or the use of commercially unavailable promotility agents.  Patient was seen last year, though due to some personal issues, found  herself lost the followup.  There are really no new features to her  problems.  Her symptoms persist at this time despite b.i.d. Nexium and  t.i.d. Phenergan.   PAST MEDICAL HISTORY:  As above.   PAST SURGICAL HISTORY:  1. Cholecystectomy.  2. Hysterectomy.  3. Appendectomy.   CURRENT MEDICATIONS:  Diovan, Sular, Levothroid, Nexium, venlafaxine,  Vytorin, Phenergan, hydrocodone/APAP, and Enjuvia.   FAMILY HISTORY:  Siblings and mother with diabetes.   SOCIAL HISTORY:  Patient is married with 4 children.  She is accompanied  by 1 daughter.  She lives with her husband.  She graduated from the 8th  grade.  She does not smoke or use alcohol.   REVIEW OF SYSTEMS:  Per diagnostic evaluation form.   PHYSICAL EXAMINATION:  Well-appearing female, in no acute distress.  Blood pressure is  124/78.  Heart rate is 88.  Weight is 196.2 pounds.  She is 5 feet 1 inch in height.  HEENT:  Sclerae anicteric.  Conjunctivae are pink.  Oral mucosa is  intact.  There is no thrush.  LUNGS:  Clear.  HEART:  Regular.  ABDOMEN:  Obese and soft without tenderness, mass, or hernia.  No  obvious succussion splash.  EXTREMITIES:  Without edema.   IMPRESSION:  This is a 77 year old female with multiple medical problems  who has chronic nausea secondary to gastroparesis.  She has had previous  extensive GI evaluations in [place] and at the expert center at  [facility].  At this point, I have encouraged her and her daughter to  return to Dr. Alycia Rossetti at [facility] to complete her evaluation.  Furthermore, they have medical and nonmedical treatment options not  currently available in [place].  They understand, and appreciate this  recommendation.     Wilhemina Bonito. Marina Goodell, MD  Electronically Signed    JNP/MedQ  DD: 04/17/2006  DT: 04/17/2006  Job #: [id number]   cc:   Jeannett Senior A. Evlyn Kanner, M.D.  Beverly Gust, MD

## 2010-06-15 NOTE — H&P (Signed)
[name], Brianna [name]               ACCOUNT NO.:  [id number]   MEDICAL RECORD NO.:  [id number]          PATIENT TYPE:  INP   LOCATION:  5740                         FACILITY:  [facility]   PHYSICIAN:  Brianna Mater. Saint [name], M.D. DATE OF BIRTH:  01-01-1933   DATE OF ADMISSION:  12/27/2004  DATE OF DISCHARGE:                                HISTORY & PHYSICAL   HISTORY OF PRESENT ILLNESS:  Brianna Brianna [name] is a 77 year old white female, long-  standing patient of my practice, with a known history of hypertension,  hyperlipidemia, hypothyroidism, and significant bowel complaints with known  reflux and significant gastroparesis.  She began feeling bad a bit before  Thanksgiving and really has not had much solids to eat in the last six days.  Even liquids have been vomited back up.  She has had no abdominal pain.  She  has had some gastrointestinal burning at times.  Her bowels have been  working normally with no diarrhea.  She has had no fevers, chills, or  sweats.  She has had some anorexia and bloating.  She has had recent testing  showing 100% retention at two hours on a gastric emptying study, and has had  prior surgery with prior partial small bowel obstruction and colon resection  revealing benign pathology.  The patient has not been taking her Reglan on a  regular basis as recommended by the gastroenterologist.  She was seen as an  outpatient today for evaluation, and due to electrolyte disorders and  general failure to thrive, is brought into the hospital today for further  treatment.   REVIEW OF SYSTEMS:  She denies any breathing trouble, no chest pains, no  fevers, chills, or sweats.  No arthritis complaints of note.  No neurologic  complaints.   PAST MEDICAL HISTORY:  1.  Hypertension.  2.  Hyperlipidemia.  3.  Hypothyroidism.  4.  Nephrolithiasis.  5.  Partial small bowel obstruction.  Colonoscopy was normal in 2005.  6.  Sleep apnea diagnosed in 2005.  7.  Benign colon resection in the  past.  8.  Total abdominal hysterectomy.  9.  Cholecystectomy.  10. Kidney stone extraction.   MEDICATIONS:  1.  Levothroid 175 mcg daily.  2.  Diovan 320 mg daily.  3.  HCTZ 12.5 mg daily.  4.  Vytorin 10/40 mg daily.  5.  Vitamins daily.  6.  Effexor XR 75 mg daily.  7.  Promethazine p.r.n.  8.  Nexium 40 mg b.i.d.  9.  Sular 10 mg daily.   ALLERGIES:  1.  PENICILLIN.  2.  ACE INHIBITORS.   SOCIAL HISTORY:  She is married.  She has several children and  grandchildren.   PHYSICAL EXAMINATION:  VITAL SIGNS:  Blood pressure 135/66, pulse 83,  respirations 18, temperature 98.2, O2 saturation is 95% on room air.  GENERAL:  We have a fairly healthy-appearing white female.  It looks like  she feels moderately unwell.  HEENT:  Sclerae anicteric.  Face is symmetrical.  Mucous membranes are  moist.  No oral lesions are present.  No evidence of swelling or obstruction  is noted.  NECK:  Supple with no JVD, bruits, or thyromegaly.  LUNGS:  Clear without wheezes, rhonchi, or rales.  She is moving air well  with no accessory muscles use.  HEART:  Regular and somewhat distant with no murmurs appreciated.  ABDOMEN:  Mildly distended, soft, with no discrete tenderness.  It is  somewhat tympanitic with hypoactive bowel sounds.  No rushes or tinkles, or  other evidence of obstruction are noted.  EXTREMITIES:  Strong distal pulses and no peripheral edema.  ___________perfusion is good.  NEUROLOGIC:  The patient is awake, alert, her mentation is good.  Speech is  clear.  No resting tremors present.  No evidence of hallucinations or  delusions are present.   LABORATORY DATA:  Sodium is 124, potassium is 3, chloride 83, CO2 of 29, BUN  6, creatinine 0.8, calcium 9.5, glucose 118.  Additional studies are  pending.  A CT scan of the abdomen and pelvis was done on July 4, showing  diffuse fatty infiltration of the liver, dilated small bowel loops in the  left upper quadrant, thought to be a  possible focal ileus, the colon was not  opacified, no acute pelvic abnormalities were noted.  Gastric emptying study  on July 19, was noted to show delayed gastric emptying as noted above.  An  upper GI on July 7, showed esophageal dysmotility which was an old finding.   In summary, we have a 77 year old white female with known gastroparesis and  gastrointestinal surgeries presenting now with nausea, vomiting, and  electrolyte disorders.  Fortunately, she has not had any neurologic  compromise due to this.  She is being hydrated and electrolytes are being  replaced.  We are going to try some small dose intravenous Reglan and  continue proton pump inhibitor therapy and recheck her electrolytes at a  later point.  There is no evidence for hormonal cause for this.  A recent  TSH in my office was around 3, so this is not a hypothyroid problem either.  There is no evidence of any infectious source.  Labs and x-rays are pending  otherwise.           ______________________________  Brianna Mater. Brianna Brianna [name], M.D.     SAS/MEDQ  D:  12/27/2004  T:  12/27/2004  Job:  [id number]

## 2010-06-15 NOTE — H&P (Signed)
NAMELATOY, LABRIOLA               ACCOUNT NO.:  [id number]   MEDICAL RECORD NO.:  [id number]          PATIENT TYPE:  INP   LOCATION:  1612                         FACILITY:  [facility]   PHYSICIAN:  Tera Mater. Evlyn Kanner, M.D. DATE OF BIRTH:  [birth year]   DATE OF ADMISSION:  06/07/2005  DATE OF DISCHARGE:                                HISTORY & PHYSICAL   Ms. Zachow is a 77 year old white female well-known to me from multiple  visits in the office with a history of hypothyroidism, chronic GI  complaints, hyperlipidemia.  She has been in a failure to thrive mode with  basically progressively worse, staying in bed, continues disoriented and  perhaps excessively using Zofran and Tylenol PM.  She has lost 11 pounds in  just a couple of weeks and basically has not gotten dressed or gotten out of  bed hardly at all in two weeks.  She has near constant nausea.  Complained  of headaches and some loose stools.  She has had multiple evaluations both  GI here, [facility] for GI and by neurology with a feeling that  she might have had some depression.  Clearly, she is worse and I suspect  electrolyte problems and other issues predominating.  She may have a  progressive form of dementia or just a vegetated depression.   PAST MEDICAL HISTORY:  1.  GI work-up with normal colonoscopy in 2005, possible small bowel      obstruction, abnormal gastric emptying with so-called tachygastria.  2.  History of sleep apnea.  3.  Nephrolithiasis.  4.  Hypothyroidism.  5.  Hyperlipidemia.  6.  Hypertension.  7.  Non-alcoholic fatty liver disease.  8.  H. pylori previously treated.  9.  She has had a laparoscopic cholecystectomy, total abdominal      hysterectomy, partial resection of her colon, and kidney stone      extraction.   FAMILY HISTORY:  Her father died at 52 of heart failure and asthma.  Mother  died at 46 of diabetes and heart failure.  Brother died of COPD.  She is  married.  Three  children and three grandchildren.   ALLERGIES:  PENICILLIN and ACE INHIBITORS which cause a cough.   MEDICATIONS:  1.  Levothroid 175.  2.  Diovan 320.  3.  HCTZ 25.  4.  Nexium 40 twice daily.  5.  Sular 10.  6.  Effexor XR 150 up from 75 recently.  7.  Zofran 4 mg p.r.n.   PHYSICAL EXAMINATION:  VITAL SIGNS:  Weight 167 reflecting a documented 11  pounds weight loss, blood pressure 122/68, pulse 76.  GENERAL:  Frail, disheveled white female making minimal eye contact sitting  in a wheelchair.  HEENT:  Sclerae appear anicteric.  There is no lid lag or exophthalmus.  There is no nystagmus present.  Oral mucous membranes still appear to be  moist.  No oral lesions are present.  NECK:  Supple.  No JVD is present.  No bruits are present.  No thyroid  enlargement is present.  LUNGS:  Clear without wheezes, rales, or rhonchi.  No excessive muscle use.  HEART:  Regular.  No murmurs appreciated.  ABDOMEN:  Soft, nontender with no masses or hepatosplenomegaly.  Bowel  sounds normoactive.  EXTREMITIES:  Strong distal pulses are present with no peripheral edema.  Nail bed perfusion is good.  NEUROLOGIC:  She has psychomotor retardation, poor eye contact, mildly  confused.  She does have a tremor of the outstretched hands.  No evidence of  acute hallucinations are present.  She is unable to tell me the situation of  what is going on.  She interacts little and her family answers for her  primarily.  SKIN:  Warm and dry with no rashes.  Some senile keratoses are present.   SUMMARY:  We have a 77 year old white female clearly in decline with failure  to thrive, confusion, GI complaints.  I suspect she may have worsening of  her pre-existing hyponatremia perhaps with some other electrolyte issues.  This may just be major depression, perhaps just some psychosis or so-called  agitated depression.  Clearly, there is some element of medication over use.  The rapid weight loss if worrisome for  possible malignant causes.  There is  no evidence of infectious cause of this at the present time.  We are going  to admit her to the hospital, laboratories, IV fluids, bed rest, psychiatry  consult and see where all of this takes [place] on a clinical course.           ______________________________  Tera Mater. Evlyn Kanner, M.D.     SAS/MEDQ  D:  06/14/2005  T:  06/14/2005  Job:  [id number]

## 2010-06-15 NOTE — Discharge Summary (Signed)
Brianna [name], Brianna [name] NO.:  [id number]   MEDICAL RECORD NO.:  [id number]          PATIENT TYPE:  INP   LOCATION:  6712                         FACILITY:  [facility]   PHYSICIAN:  Tera Mater. Evlyn Kanner, M.D. DATE OF BIRTH:  29-Mar-1932   DATE OF ADMISSION:  01/23/2005  DATE OF DISCHARGE:  01/25/2005                                 DISCHARGE SUMMARY   DISCHARGE DIAGNOSES:  1.  Intractable nausea and vomiting secondary to gastroparesis, clinically      much improved.  2.  Volume depletion, improved after hydration.  3.  Mild hyponatremia, improved.  4.  Mild leukocytosis, resolved.  5.  Diarrhea.  No recurrence.  6.  Hypertension, stable.  7.  Hypothyroidism, clinically euthyroid.  8.  Mild hyperglycemia, with probable impaired glucose tolerance by      laboratories.  9.  Obstructive sleep apnea.  10. History of kidney stones.  11. Hyperlipidemia.   CONSULTATIONS:  Jordan Hawks. Elnoria Howard, M.D. of gastroenterology.   [name] is a 77 year old white female with known history of  gastroparesis, recurrent nausea and vomiting, who returned with a repeat  bout.  Obstruction was ruled out, and the patient was treated with IV Reglan  and hydration.  She was then transitioned over to oral medications, and this  morning her diet was advanced, and she did well.  She has no abdominal pain.  X-rays have resolved.  She did have mild hyperglycemia at presentation, but  has a perfectly normal A1c.  Her mild hyponatremia has also improved and  nearly resolved without clinical sequelae at the present time.  She feels  well and is ready to go home.  She did have some excess bowel movements on  presentation, and those have resolved.   LABORATORY DATA:  Today, white count is 8,400, hemoglobin 11.3, MCV 87.2,  platelets 297,000.  Chemistries:  Sodium 133, potassium 3.8, chloride 103,  CO2 25, BUN 3, creatinine 0.8, glucose 85, calcium 8.6.  Hemoglobin A1c on  January 23, 2005 was 5.4%.  TSH  was 1.486.  Urinalysis was yellow, hazy,  with 3-6 WBCs and 30 mg of protein.  No sugar was seen.  No cultures were  done.   RADIOLOGY TESTING:  An abdominal x-ray yesterday was no acute disease.  On  January 23, 2005, chest x-ray:  No acute cardiopulmonary disease, and  negative abdominal x-rays.   TODAY'S VITALS:  Blood pressure 122/68; pulse 84; respirations 20;  temperature 98.6; room air saturation is 93%.   MEDICATIONS AT DISCHARGE:  1.  Reglan 10 mg 3 times a day with meals.  2.  She will get Protonix 40 mg or Nexium 40 mg once daily.  3.  She will get Diovan 320 mg once daily.  4.  Effexor XR 75 mg twice daily.  5.  Synthroid 175 mg daily.  6.  Potassium 20 mEq once daily.  7.  Sular 10 mg once daily.  8.  HCTZ will be discontinued.  9.  She will have p.r.n. promethazine.   She will have followup with Dr. Elnoria Howard in 2 weeks.  She  will see me in 2-3  weeks.  She will call if there are further problems.   In summary, we have a 77 year old white female with known history of  gastroparesis, presenting with flare of gastroparesis problems.  She has  done well with hydration and bowel rest, but now has advanced diet with good  tolerability.           ______________________________  Tera Mater. Evlyn Kanner, M.D.     SAS/MEDQ  D:  01/25/2005  T:  01/26/2005  Job:  [id number]   cc:   Jordan Hawks. Elnoria Howard, MD  Fax: [phone]

## 2010-06-15 NOTE — Discharge Summary (Signed)
Brianna [name], Brianna [name] NO.:  [id number]   MEDICAL RECORD NO.:  [id number]          PATIENT TYPE:  INP   LOCATION:  1612                         FACILITY:  [facility]   PHYSICIAN:  Brianna [name]       DATE OF BIRTH:  05-22-1932   DATE OF ADMISSION:  06/07/2005  DATE OF DISCHARGE:  06/16/2005                                 DISCHARGE SUMMARY   PRIMARY CARE Brianna [name]:  Brianna [name], M.D.   DISCHARGE DIAGNOSES:  1.  Confusion and delirium, improved.  2.  Positive RPR, positive TPPA, consistent with congenital syphilis, is      status post prior treatment.  3.  Hypothyroidism.  4.  Hyponatremia.  5.  Hypertension.  6.  Urinary tract infection.  7.  Obstructive sleep apnea.  8.  History of nephrolithiasis.  9.  Hyperlipidemia.  10. Nonalcoholic fatty liver disease.  11. Previously-treated Helicobacter pylori.  12. History of laparoscopic cholecystectomy.  13. History of gastroparesis.  14. History of partial right hemicolectomy due to large villous adenoma.   DISCHARGE MEDICATIONS:  1.  Synthroid 150 mcg p.o. daily.  2.  Sular 10 mg daily.  3.  Nexium 40 mg p.o. b.i.d.  4.  Diovan 320 mg daily.  5.  Effexor XR 150 mg daily.  6.  Zyprexa 2.5 mg p.o. q.h.s., #14.  7.  Wear CPAP mask as directed.  8.  Discontinue hydrochlorothiazide.   DISCHARGE PROCEDURES:  1.  MRI of the brain done on May 18 showing atrophy with chronic      microvascular ischemic changes, no definitive acute stroke, and soft      tissue density surrounding the dens, most consistent with pannus      formation.  It is possible that this may be a manifestation of rheumatic      or psoriatic arthritis.  2.  CT scan of the chest, abdomen and pelvis on May 16, revealing mild      atelectasis, bilateral lobes, otherwise negative CT of the chest.      Negative abdominal scan.  Pelvis shows status post hysterectomy and      sigmoid diverticulosis without focal inflammatory changes to suggest    diverticulitis.  3.  Chest x-ray on May 13 shows no acute cardiopulmonary disease.  4.  Cranial CT on May 11 shows atrophy, no acute or focal abnormality.   CONSULTATIONS:  1.  Brianna [name], M.D., from psychiatry.  2.  Brianna [name]. Brianna [name], M.D., of infectious disease.   HISTORY OF PRESENT ILLNESS:  Briefly, Brianna [name] is a 77 year old white  female with known hypothyroidism, chronic GI complaints and hyperlipidemia.  She has had failure to thrive with progressive worsening of symptoms, which  brought her to medical care.  Symptoms included remaining in bed, becoming  disoriented, using Zofran and Tylenol P.M. excessively, an 11-pound weight  loss, constant nausea, headache, and loose stools.  She has had multiple  recent evaluations from her local gastroenterologist, [facility] gastroenterologist and neurology.  The overall impression was that  she is suffering from an underlying  depression.   HOSPITAL COURSE:  Brianna [name] was a 77 year old white female admitted on Jun 07, 2005, with failure to thrive and GI complaints worrisome for depression  or psychosis.  This was felt to be potentially exacerbated by medication  overuse and the potential issues of hyponatremia.  Initial plan was to admit  her to the hospital, get labs, start her on IV fluids, continue at bed rest,  get a psychiatric consult, and follow the clinical course.  The following  laboratory data came in, her sodium was 123.  Her TSH was 0.1.  White count  was 10.8 thousand.  The rest of her labs were fine.  Cranial CT was  unremarkable.  Her altered mental status was felt to be multifactorial due  to electrolyte disturbances, polypharmacy and underlying depression.  Dr.  Jeanie [name] from psychiatry was initially consulted.  He felt that some of her  shakiness may be secondary to Reglan as far as extrapyramidal symptoms.  He  felt that increasing in T4 and T3 can contribute some to the patient's   symptoms of depression and pseudodementia and discussed with the patient and  wanted to add Remeron and work with some of her other medications.  He felt  that she would be a great patient to follow up with Brianna [name] as an  outpatient.  She was followed over the weekend and had the workup as  described above.  Over the next couple of days her sodium level improved to  129 and then 133.  Her potassium was low at 2.8 and required replacement.  Brianna [name] came back and adjusted her medications even further.  Neurology was then consulted on Jun 11, 2005, and felt that she had subacute  delirium, multifactorial in nature, secondary to dehydration, hyponatremia,  gastroparesis, hyperthyroidism, and recommended to check a CT of the chest,  abdomen and pelvis to rule out a GI malignancy and recommended getting an  EEG.  This was all discussed with the family.  She remained clinically  confused on the 16th and 17th.  On the 18th her laboratory data was back to  normal.  She was diagnosed with a urinary tract infection and was placed on  Cipro.  I started seeing her on May 19.  She had been recently started on  Zyprexa and the patient was not sleeping well but was actually feeling and  doing some better as she was alert and oriented x3, she was eating okay, and  she was walking.  All of her vital signs were stable except she did have  slightly high blood pressure at 151/87.  The rest of her exam was  unremarkable.  The rest of her labs were unremarkable except for a positive  RPR at a 1:2 ratio.  The family thought she was getting closer to her  baseline mentally but I felt that we needed to watch her for at least 24  hours considering how much confusion issue she did have throughout the  hospital stay and some hallucinations.  I felt that the RPR finding was  presumed a false positive versus tertiary syphilis.  The FTA antibodies were ordered and further history-taking was then pursued.  She  reports that both  her parents were treated for syphilis many years ago and she said she took  shots when she was a little girl.  Later on that day the TPPA was  reactive.  The patient reports that she has not had any recent sexual  activity.  She has been in a monogamous relationship times several years  with her husband, and she reports being faithful.  She also again went over  the history that her parents were treated for syphilis when she was a child  and she was treated with about 2 weeks of shots at that time.  I went ahead  and consulted with infectious disease in anticipation that this might be a  possible tertiary syphilis.  Infectious disease felt that her serology was  consistent with her history of treated congenital syphilis.  In the setting  of her now-resolved symptoms, neurosyphilis seems very unlikely.  She was  given options of potentially doing a lumbar puncture to be definitive and  just treat this with penicillin over the course of a few weeks, and she  declined to want to pursue that and was not felt that it was needed.  Therefore, at this time no lumbar puncture or prolonged empiric treatment  was added.  It was felt that Dr. Evlyn [name] could monitor the patient as an  outpatient, and May 19 she denied continued hallucination, confusion,  paranoia or any psychosis.  She was watched until May 20 and she had no  complaints, and she was felt to be back at her baseline.  All her vital  signs were stable at that time.  All of her labs were appropriate.  Her  sodium was 139, her calcium was 9.5.  She was alert and oriented to person,  place, age and situation.  The rest of her exam was felt benign.  Therefore,  as far as her confusion and delirium, it was considered to be better.  Her  urinary tract infection was treated.  Her sodium was back to normal.  It was  felt that she was going to take Zyprexa for about 2 weeks and then come off  the medication.  At this point she will  be discharged home and will just be  followed up as an outpatient to see if any recurrence of symptoms.  She will  follow up with Dr. Evlyn [name] in about 2 weeks or less than 14 days, and she will  call with any questions or concerns.  She has no restrictions in her  activity.  She is to eat a heart-healthy diet and she is to be supervised by  her family.      Brianna [name]  Electronically Signed     JMR/MEDQ  D:  08/03/2005  T:  08/03/2005  Job:  [id number]

## 2010-06-15 NOTE — Consult Note (Signed)
Brianna [name], LINDO NO.:  [id number]   MEDICAL RECORD NO.:  [id number]          PATIENT TYPE:  INP   LOCATION:  6712                         FACILITY:  [facility]   PHYSICIAN:  Jordan Hawks. Elnoria Howard, MD    DATE OF BIRTH:  1932/07/18   DATE OF CONSULTATION:  01/24/2005  DATE OF DISCHARGE:                                   CONSULTATION   REASON FOR CONSULTATION:  Nausea and vomiting secondary to gastroparesis.   PRIMARY CARE Marten Iles:  Tera Mater. Evlyn Kanner, M.D.   HISTORY OF THE PRESENT ILLNESS:  This is a 77 year old white female with a  past medical history of gastroparesis with multiple exacerbations, partial  small bowel obstruction, hypertension, hyperlipidemia, and hypothyroidism.  The patient presented with acute nausea and vomiting.  The patient was in  her usual state of health and suddenly developed nausea and vomiting that  was intractable.  She subsequently became dehydrated and presented to the  emergency room for further evaluation.  Subsequently the patient was  admitted for continued management of her current situation.  She was  recently hospitalized approximately two weeks with a similar exacerbation  and with supportive care she improved.  The patient states that she has been  taking her medications as previously instructed by Dr. Loreta Ave and denies any  __________ .  The patient denies having any hematemesis, fevers, chills or  abdominal pain.   Over the course of her hospitalization the patient states that her symptoms  have improved and the nausea and vomiting has resolved at this time, and she  is able to tolerate clear liquids.   PAST MEDICAL HISTORY:  The past medical history is as stated above.   PAST SURGICAL HISTORY:  The past surgical history is as stated with the  addition of partial right colectomy for a large villous adenoma.   SOCIAL HISTORY:  The social history is negative for tobacco, alcohol or  illicit drug use.   REVIEW OF SYSTEMS:   The review of systems is negative for any dizziness,  fevers, chills, chest pain, shortness of breath, arthritis, arthralgias, and  myalgias.  It is positive for nausea and vomiting, and some mild abdominal  tenderness secondary to the retching.   FAMILY HISTORY:  The family history is noncontributory.   ALLERGIES:  Allergies to PENICILLIN and ACE INHIBITORS.   MEDICATIONS:  Diovan, Effexor, K-Dur, Lovenox, Protonix, Reglan, Synthroid,  Phenergan, and Tylenol.   PHYSICAL EXAMINATION:  VITAL SIGNS:  Blood pressure is 120/54, heart rate is  91, respirations 18, temperature 98.1, and pulse ox is 94% on room air.  GENERAL APPEARANCE:  Generally the patient is in no acute distress.  She is  alert and oriented.  HEENT:  Normocephalic and atraumatic.  Extraocular muscles are intact.  Pupils are equal and react to light.  NECK:  The neck is supple.  No lymphadenopathy.  LUNGS:  The lungs are clear to auscultation bilaterally.  HEART:  Cardiovascular shows regular rate and rhythm without murmurs,  gallops or rubs.  ABDOMEN:  The abdomen is obese and soft.  There is mild tenderness,  diffuse,  which is secondary to the retching.  EXTREMITIES:  No clubbing, cyanosis or edema.   LABORATORY DATA:  Laboratory values  on January 24, 2005; white blood cell  count 13.5, hemoglobin 12.0 and platelets 343,000; the white blood cell  count has decreased from 27.0.  Sodium 130, potassium 3.5, chloride 101, CO2  23, BUN 11, creatinine 0.9, and glucose 105.  Abdominal x-ray is negative  for any dilated loop of bowel or any evidence of obstruction.   IMPRESSION:  1.  Nausea and vomiting secondary to idiopathic gastroparesis.  2.  Elevated white blood cell count of unknown etiology.   After evaluation of the patient it is clear that she does have  gastroparesis.  This is a difficult problem to manage as she has had  multiple exacerbations.  She has responded to p.o. Reglan in the past as  well as  Phenergan suppositories.  Unfortunately when she increases her dose  of Reglan to 10 mg q.i.d. she subsequently develops diarrhea, which results  in her decreasing the dose to two tablets twice a day.  Unfortunately at  that dosage this does not help her gastroparesis.   Discussion was had with Dr. Loreta Ave with regards to starting the patient on  Zelnorm; however, a side effect of Zelnorm is also the development of  diarrhea.  The patient has responded to IV Reglan at this time; and, in  light of the current situation, no change in the medications will be  attempted at this time.   PLAN:  The plan is to:  1.  Continue with IV Reglan.  2.  Advance to a full liquid diet.  3.  Continue with IV hydration.  4.  Once the patient has demonstrated that she is able to tolerate p.o. she      can be followed up as an outpatient in two weeks.      Jordan Hawks Elnoria Howard, MD  Electronically Signed     PDH/MEDQ  D:  01/24/2005  T:  01/24/2005  Job:  [id number]   cc:   Jeannett Senior A. Evlyn Kanner, M.D.  Fax: [phone]

## 2010-06-15 NOTE — Consult Note (Signed)
Brianna [name], Brianna [name]               ACCOUNT NO.:  [id number]   MEDICAL RECORD NO.:  [id number]          PATIENT TYPE:  INP   LOCATION:  1612                         FACILITY:  [facility]   PHYSICIAN:  Pramod P. Pearlean Brownie, MD    DATE OF BIRTH:  11-07-1932   DATE OF CONSULTATION:  DATE OF DISCHARGE:                                   CONSULTATION   REQUESTING PHYSICIAN:  Tera Mater. Evlyn Kanner, M.D.   REASON FOR REFERRAL:  Confusion and altered mental status.   HISTORY OF PRESENT ILLNESS:  Ms. Sarria is a 77 year old Caucasian lady who  was admitted on Jun 07, 2005 with symptoms of altered mental status and low  sodium.  The patient is unable to provide any history, which is obtained  from her husband and daughter, who are present at the bedside.  The patient  apparently has had increasing confusion and disorientation for the last one  week.  She was actually seen in the office by Dr. Porfirio Mylar Dohmeier about a  month ago for intermittent confusion.  She ordered an MRI scan of the brain,  which did not show any acute abnormality, it showed only changes of white  matter__________  disease.  The patient's confusion around that time was  intermittent, and she would be disoriented for short periods of time and  would be back to her baseline; however, in the last one week, she has been  quite confused and disoriented at times, not recognizing the family.  These  episodes were initially intermittent, but for the last several days, she was  confused throughout.  She was admitted with a low sodium of 120.  She has  also had significant nausea and kept on complaining of her stomach being  upset and was not eating for several days and not sleeping well.  Sodium was  up to 133 this morning with potassium low at 2.8.  The patient's mental  status has improved slightly today, and she is more conversant and able to  recognize family members today.  The patient was also thought to have  hyperthyroidism on admission  with TSH of 0.18.  The patient apparently has  had no previous history of significant neurological problems.  She has seen  Dr. Vickey Huger in the past for obstructive sleep apnea.  She was prescribed a  CPAP machine, which she did not tolerate; hence, was advised to take oxygen  via nasal cannula last night.  The patient and family members denied  previous history of similar problems; however, review of the patient's chart  revealed a similar admission in November, 2006 for nausea, vomiting,  dehydration, gastroparesis.  At that time, she was admitted with a sodium of  123, without significant mental status changes.  She improved with gradual  hydration.  The patient had a GI workup at that time, including a CT scan of  the abdomen and pelvis, which were unremarkable, and she had seen Dr. Charna Elizabeth, gastroenterologist, as well.  She has a history of a benign colon  polyp removed about five years ago.  Patient has recently lost about  11  pounds of weight in the last one month.  There is no history of significant  headaches, focal extremity weakness.  The patient does admit to being  slightly off-balance and dizzy at times, but she has not had any major  falls.  She denies any vertigo or syncopal symptoms.   PAST MEDICAL HISTORY:  1.  Hypothyroidism.  2.  Hypertension.  3.  Hyperlipidemia.  4.  Obstructive sleep apnea.  5.  Gastroparesis.   PAST SURGICAL HISTORY:  1.  Abdominal hysterectomy.  2.  Partial right hemicolectomy due to large villous adenoma.  3.  Cholecystectomy.  4.  Nephrolithiasis.   CURRENT MEDICATIONS:  1.  Lovenox 40 mg daily.  2.  Synthroid 150 mcg daily.  3.  Magnesium oxide 400 mg twice daily.  4.  Remeron 15 mg at bedtime.  5.  Sular 10 mg daily.  6.  Protonix 80 mg twice daily.  7.  Potassium 20 mEq twice daily.  8.  Diovan 320 mg daily.  9.  Effexor 150 mg daily.  10. Tylenol p.r.n.  11. Zofran p.r.n.   MEDICATION ALLERGIES:  PENICILLIN,  CAPTOPRIL.   REVIEW OF SYSTEMS:  As documented above.   SOCIAL HISTORY:  The patient is married and lives with her husband.  She  does not smoke or drink.  She was previously independent in activities of  daily living.   PHYSICAL EXAMINATION:  VITAL SIGNS:  Temperature 97.9, pulse rate 88 per  minute, regular respiratory rate 20 per minute, blood pressure 146/69.  Saturations are 96% on room air.  GENERAL:  A pleasant, obese, elderly Caucasian lady.  HEENT:  Nontraumatic.  ENT exam unremarkable.  NECK:  Supple without bruit.  LUNGS:  Clear to auscultation.  HEART:  No murmur or gallop.  ABDOMEN:  Soft, nontender.  NEUROLOGIC:  Patient is pleasant, awake, alert, cooperative.  There is no  aphasia, apraxia, or dysarthria.  She has diminished attention.  Recall is  1/3.  She is able to name only three objects beginning with the letter A.  Animal naming test is only 6.  She is able to identify objects in the  photograph and describe features.  She has slight distractibility.  There is  no active hallucinations.  She can follow two-step commands.  Pupils are  small and irregular but reactive.  Movements are full range without  nystagmus.  She blinks __________  bilaterally.  Face is symmetric.  Palatal  movements are normal.  Tongue is midline.  Motor system exam:  There is no  upper extremity drift.  Symmetric strength, tone, reflexes.  Finger-to-nose  and knee-to-heel coordination accurate.  She can get up without assistance.  Walks with a slightly broad-based gait.  She is slightly ataxic when she  makes a turn and needs to catch on to the bed.  She can stand on a narrow  base with eyes opened and closed.  She is unable to walk tandem.   DATA REVIEW:  CT scan of the head, noncontrast study dated Jun 07, 2005,  reveals a mild degree of generalized cerebral atrophy.  No acute  abnormalities noted.  MRI scan of the brain done in April, 2007.  It also shows similar findings  with some  white matter __________  changes.   Serum sodium on admission was 120, but it has gradually come up to 133  today.   IMPRESSION:  A 77 year old lady with subacute mental status changes, likely  secondary to delirium.  Etiology probably multifactorial due to combination  of dehydration, hyponatremia, as well as hyperthyroidism.  Since the  patient's symptoms have clearly been going on for a couple of months, and  she has lost significant weight, I would look for alternative conditions  like underlying gastrointestinal malignancy, which could explain her  hypernatremia as well as nausea and weight loss as well.   PLAN:  Recommend CT scan of the chest, abdomen, and pelvis with and without  contrast.  Continue gradual hydration.  Continue present medication regimen,  as her confusion seems to be improving.  I had a long discussion with the  patient's husband, daughters, and family members and answered questions.  I  would be happy to follow up with the patient in consult.  Kindly call for  questions.  Also check an EEG to rule out any seizure activity.           ______________________________  Sunny Schlein. Pearlean Brownie, MD     PPS/MEDQ  D:  06/11/2005  T:  06/12/2005  Job:  [id number]

## 2010-06-15 NOTE — Procedures (Signed)
[facility]  Patient:    Brianna [name], Brianna [name]                    MRN: [id number] Proc. Date: 07/25/99 Adm. Date:  [id number] Attending:  Charna Elizabeth CC:         Reather Littler, M.D.             Sandria Bales. Ezzard Standing, M.D.                           Procedure Report  DATE OF BIRTH:  1932/09/19  PROCEDURE:  Colonoscopy.  ENDOSCOPIST:  Anselmo Rod, M.D.  REFERRING PHYSICIAN:  Reather Littler, M.D.  INSTRUMENTS USED:  Olympus video colonoscope.  INDICATIONS:  A 76 year old white female with a history of a tubular villous adenoma removed at ______ by right colectomy.  Repeat colonoscopy is being done in 2 years to rule out recurrent polyps  INFORMED CONSENT:  Consent was procured from the patient.  PREPROCEDURE PREPARATION:  The patient was fasted for eight hours prior to the procedure and prepped with a bottle of magnesium citrate and a bottle of NuLytely the night prior to the procedure.  PREPROCEDURE PHYSICAL EXAMINATION:  VITAL SIGNS:  The patient had stable vital signs.  NECK:  Neck is supple.  CHEST:  Clear to auscultation.  S1, S2 regular.  ABDOMEN:  Soft with normal abdominal bowel sounds.  DESCRIPTION OF PROCEDURE:  The patient was placed in the left lateral decubitus position and sedated with 100 mg of Demerol and 10 mg of Versed intravenously.  Once the patient was adequately sedated and maintained on low flow oxygen, and continuous cardiac monitoring, the Olympus video colonoscope was advanced from the rectum to the cecum to 80 cm without difficulty.  A small isolated diverticulum was seen at 50 cm.  The anastomosis appeared healthy.  At 80 cm the small bowel mucosa was visualized and appeared healthy as well.  No masses, polyps, erosions or ulcerations or diverticula were present except for the one mentioned at 50 cm.  Small internal hemorrhoids and moderate external hemorrhoids were seen on retroflexion and anal inspection  respectively.  The patient tolerated the procedure well without complications.  IMPRESSION: 1. Essentially healthy appearing colon and distal small bowel. 2. Small isolated diverticulum at 50 cm. 3. No masses or polyps seen. 4. Small nonbleeding internal hemorrhoids and moderate sized external    hemorrhoid.  RECOMMENDATIONS: 1. The patient has been advised to increase the fluid and fiber in her diet. 2. Outpatient followup is advised on a p.r.n. basis. 3. A repeat colonoscopy is recommended in the next 5 years unless the patient    was to develop any symptoms in the interim. DD:  07/25/99 TD:  07/26/99 Job: [id number]

## 2010-06-15 NOTE — H&P (Signed)
Brianna [name], OPPENHEIMER NO.:  [id number]   MEDICAL RECORD NO.:  [id number]          PATIENT TYPE:  INP   LOCATION:  1845                         FACILITY:  [facility]   PHYSICIAN:  Kari Baars, M.D.  DATE OF BIRTH:  09-Nov-1932   DATE OF ADMISSION:  01/23/2005  DATE OF DISCHARGE:                                HISTORY & PHYSICAL   PRIMARY CARE PHYSICIAN:  Tera Mater. Evlyn Kanner, M.D.   PRIMARY GASTROENTEROLOGIST:  Anselmo Rod, M.D.   CHIEF COMPLAINT:  Nausea and vomiting.   HISTORY OF PRESENT ILLNESS:  Brianna [name] is a 77 year old white female  patient of Dr.  Evlyn Kanner and Dr. Loreta Ave with a history of severe gastroparesis  with recurrent episodes of nausea and vomiting resulting in dehydration  requiring hospitalization as well as history of partial small-bowel  obstruction in December 2004, who presented to the emergency department this  morning with intractable nausea and vomiting.  The patient has been  hospitalized 4 times in the past year including most recently from November  30 through December 2 for nausea, vomiting, and diarrhea.  She has undergone  workup for this and has been found to have gastroparesis with a gastric  emptying study in June 2006 revealing 100% retention at 60 minutes and 85%  retention at 2 hours.  She has also had an endoscopy in June 2006 revealing  antral gastritis and is on proton pump inhibitor therapy twice daily.  Her  episode most recently improved with supportive care during hospitalization  without the need for any addition evaluation or therapy.  She was stable  until 2 to 3 days ago when she developed nausea and vomiting each morning.  This improved with Phenergan until last evening when she developed severe  intractable nausea and vomiting with some bile-colored emesis.  She is  having crampy diffuse abdominal pain.  She has had normal bowel movements  about 2 times daily with no diarrhea during this episode.  She does feel  dehydrated.  She denies any fevers, chills, or sweats.  This is similar to  her prior episodes.  She continues to eat small snack-size portions of meals  throughout the day and has not changed her diet significantly recently  despite the holidays.   REVIEW OF SYSTEMS:  All systems reviewed with the patient and are negative  except as in the HPI and mild headaches.  She denies any chest pain,  shortness of breath, or urinary symptoms.   PAST MEDICAL HISTORY:  1.  Gastroparesis as above.  2.  Hypertension.  3.  Hyperlipidemia.  4.  Hypothyroidism.  5.  History of partial small-bowel obstruction (December 2004).  6.  Obstructive sleep apnea.  7.  Status post partial right hemicolectomy (1998) secondary to large      villous adenoma.  Negative colonoscopy in August 2005.  8.  Status post total abdominal hysterectomy.  9.  Status post cholecystectomy.  10. Nephrolithiasis, status post extraction.   MEDICATIONS:  1.  Reglan 10 mg before meals or food and nightly.  2.  Diovan 320 mg daily.  3.  Effexor XR 75 mg twice daily.  4.  Hydrochlorothiazide 25 mg daily.  5.  Synthroid 175 mcg daily.  6.  Nexium 40 mg twice daily.  7.  Phenergan 25 mg p.r.n.  8.  KCl 20 mEq twice daily.  9.  Sular 10 mg daily.   ALLERGIES:  PENICILLIN and ACE INHIBITORS.   SOCIAL HISTORY:  She is married with 3 children.  She does not drink alcohol  or smoke tobacco.   FAMILY HISTORY:  Her mother had diabetes.  Father had coronary artery  disease.   PHYSICAL EXAMINATION:  VITAL SIGNS: Temperature 98.6, blood pressure 146/68,  pulse 117 initially, 107 currently. Respirations 14, oxygen saturation 96%  on room air.  GENERAL:  Pleasant white female in no acute distress.  She is resting  comfortably.  HEENT:  She has no scleral icterus.  Pupils equal, round, and reactive to  light.  Extraocular movements intact.  Oropharynx is mildly dry.  NECK:  Supple without lymphadenopathy, JVD, or carotid bruits.   HEART:  Regular rate and rhythm without murmurs, rubs, or gallops.  LUNGS:  Clear to auscultation bilaterally.  ABDOMEN:  Soft, nondistended, nontender with hypoactive bowel sounds.  She  has minimal tenderness without rebound or guarding.  She has a midline scar  which is well healed.  EXTREMITIES:  No clubbing, cyanosis, or edema.  NEUROLOGIC: Alert and oriented x4.   LABORATORY DATA:  CBC significant for white blood count of 27, hemoglobin  14.2, platelets 443.  BMET significant for sodium 125, potassium 4.6,  chloride 90, bicarb 21, BUN 12, creatinine 1.3, glucose 179.  AST and ALT  are 39 and 27, respectively.  Albumin 3.8.  Urinalysis negative for ketones,  3 to 6 white blood cells.   STUDIES:  Acute abdominal series personally reviewed shows no acute findings  with no evidence of obstruction.   ASSESSMENT AND PLAN:  1.  Intractable nausea and vomiting secondary to gastroparesis versus less      likely recurrent small-bowel obstruction:  Will admit for IV hydration,      IV antiemetics, IV Reglan.  We will monitor serial abdominal exams and      check a KUB in the morning to rule out obstruction.  Continue proton      pump inhibitor.  Will start with a clear liquid diet and advance slowly      as tolerated. We will ask Dr. Loreta Ave to see her as she did not see her      during her prior hospitalization.  2.  Hyponatremia secondary to #1: Normal saline at 125 mL hourly and      monitor.  We will discontinue hydrochlorothiazide, although I am not      sure she was actually taking this.  3.  Leukocytosis secondary to #1: Will monitor this.  There is no evidence      of significant infection or diarrhea to otherwise explain.  4.  Hypertension: Continue Diovan and Sular.  5.  Hypothyroidism: Continue Synthroid.  Check a TSH.  6.  Hyperglycemia: This is likely stress induced in the setting of acute      illness.  We will check a hemoglobin A1c, although I presume this has     been  normal in the past.  7.  Deep vein thrombosis prophylaxis with Lovenox.      Kari Baars, M.D.  Electronically Signed     WS/MEDQ  D:  01/23/2005  T:  01/23/2005  Job:  [id number]   cc:   Tera Mater. Evlyn Kanner, M.D.  Fax: [phone]   [id number] NUU VOZD, M.D.  Fax: [phone]

## 2010-06-15 NOTE — Procedures (Signed)
EEG NUMBER:  [id number].   REQUESTING PHYSICIAN:  Pramod P. Pearlean Brownie, MD   CLINICAL HISTORY:  This is a portable EEG done without photic stimulation or  hyperventilation.  A 77 year old woman with confusion.  EEG is performed for  evaluation of encephalopathy.   DESCRIPTION:  The dominant rhythm for this tracing is a low amplitude alpha  rhythm of 9-10 Hz, which predominates posteriorly, appears without abnormal  asymmetry and attenuates with eye opening and closing.  Abundant low  amplitude fast activity is seen throughout.  No focal slowing is noted.  No  epileptiform discharges were seen.  Drowsiness occurs naturally, as  evidenced by fragmentation in the background and generalized attenuation of  rhythms.  During the middle part of the study, more diffuse slowly appears  with appearance of K-complexes, suggestive of stage II sleep.  No  abnormality was seen in the drowsy or sleep states.  Photic stimulation and  hyperventilation were not performed.  Single channel devoted EKG revealed  sinus rhythm throughout with greater than approximately 96 beats per minute.   CONCLUSION:  Normal study in the awake, drowsy, and sleep states.      Michael L. Thad Ranger, M.D.  Electronically Signed     BJY:NWGN  D:  06/12/2005 15:45:00  T:  06/13/2005 10:39:34  Job #:  [id number]

## 2010-06-15 NOTE — Consult Note (Signed)
NAMESIBEL, Brianna [name]               ACCOUNT NO.:  [id number]   MEDICAL RECORD NO.:  [id number]          PATIENT TYPE:  INP   LOCATION:  1507                         FACILITY:  [facility]   PHYSICIAN:  Anselmo Rod, M.D.  DATE OF BIRTH:  August 05, 1932   DATE OF CONSULTATION:  08/01/2004  DATE OF DISCHARGE:                                   CONSULTATION   REASON FOR CONSULTATION:  Diarrhea with severe nausea and vomiting that  lasted about 24 hours.   ASSESSMENT:  1.  Question antibiotic-associated diarrhea versus a viral syndrome.      Probably worsened by Reglan.  2.  Gastritis, on Nexium.  3.  History of small bowel obstruction in the past versus recent small bowel      obstruction versus ileus on CT; however, the clinical picture is not      consistent with these findings.  4.  Abnormal gastric emptying, started on Reglan in the recent past.  5.  Status post right hemicolectomy for villous adenoma in 1998.  6.  History of early colonic diverticulosis.  7.  Hypothyroidism, on Levothroid.  8.  Hypertension, on Diovan and Sular.  9.  Hyperlipidemia, on Vytorin at home.  10. Status post laparoscopic cholecystectomy and total abdominal      hysterectomy in the past.  11. Fatty liver on CT, question NASH.  12. Obesity.   RECOMMENDATIONS:  1.  Agree with IV hydration and use of probiotics.  2.  Agree with Metamucil or Citrucel stool-bulking agents.  3.  Hold Reglan for now.  4.  Carafate in between meals and at bedtime.  5.  Outpatient followup in the next week.  A low fat diet has been      recommended for now.   DISCUSSION:  [name] is a very pleasant 77 year old white female  with the above-mentioned medical problems, who has been doing well until  July 30, 2004 when she developed severe nausea and vomiting.  She has had  problems with nausea in the past but has not vomited.  This was attributed  to a very abnormal gastric emptying study she had done on July 16, 2004  that  showed 100% activity remaining in the stomach at 60 minutes and 85% at 120  minutes.  She was started on Reglan, which seemed to have helped her  symptoms; however, she developed some nausea followed by vomiting on July 30, 2004 with severe diarrhea and developed several loose bowel movements.  Today, at about 11:00 in the morning, her daughter brought her to the  hospital at [facility] via EMS.  She had a CT scan that showed a question  of SBO; however, the clinical picture was not consistent with.  She had no  abdominal pain, fever, chills, or rigors.  She has had three soft bowel  movements today without any blood or mucus in the stool.  Appetite is  somewhat decreased.  She has been taking tetracycline and Flagyl until  Monday.   PAST MEDICAL HISTORY:  See list above.   ALLERGIES:  1.  PENICILLIN.  2.  ACE INHIBITORS.   FAMILY HISTORY:  Her father died at 55 of asthma and CHF.  Her mother died  at 64 of complications of diabetes and congestive heart failure.  She has a  brother who died of complications of asthma.  She has sisters who died of  coronary artery disease, emphysema, and diabetes.  There is no family  history of breast, ovarian, uterine, or colon cancer.   PERSONAL HISTORY:  The patient lives in [place] and is a homemaker.  She  has worked at [employer] for about 17 years before she quit working there.  She denies the use of alcohol, tobacco, or drugs.  She has a very supportive  family and a daughter who lives next door.   PHYSICAL EXAMINATION:  VITAL SIGNS:  Temperature 97.5, pulse 98 per minute,  blood pressure 158/76, respirations 16.  O2 sat was 94% on room air.  GENERAL:  A pleasant, cooperative elderly female in no acute distress with  stable vital signs.  NECK:  Supple.  LUNGS:  Clear to auscultation.  No wheezes.  HEART:  S1 and S2.  No murmur, rub or gallop.  ABDOMEN:  Soft, nontender, nondistended with normal bowel sounds.  No   hepatosplenomegaly or masses palpable.  RECTAL:  Deferred.  EXTREMITIES:  Without clubbing or cyanosis.  NEUROLOGIC:  Nonfocal.   LABORATORY EVALUATION:  Admission revealed a white count of 7.3, which is  down to 9.5 today with a hemoglobin of 10.6 with hematocrit 31.3 and  platelets of 259,000.  Sodium 138, potassium 4.2, chloride 106, CO2 25, BUN  8, creatinine 1, glucose 105.   PLAN:  As above.  I discussed her condition with Dr. Adrian Prince and will  follow on an outpatient basis.  She has been advised to hold off on the  Reglan for now.       JNM/MEDQ  D:  08/01/2004  T:  08/01/2004  Job:  [id number]   cc:   Tera Mater. Evlyn Kanner, M.D.  [address]  [place]  [place] [address]  Fax: [phone]

## 2010-06-15 NOTE — H&P (Signed)
NAME:  BETUL, BRISKY                         ACCOUNT NO.:  [id number]   MEDICAL RECORD NO.:  [id number]                   PATIENT TYPE:  EMS   LOCATION:  MAJO                                 FACILITY:  [facility]   PHYSICIAN:  Geoffry Paradise, M.D.               DATE OF BIRTH:  Nov 27, 1932   DATE OF ADMISSION:  01/24/2003  DATE OF DISCHARGE:                                HISTORY & PHYSICAL   CHIEF COMPLAINT:  Nausea and vomiting.   HISTORY OF PRESENT ILLNESS:  Ms. Mcmillen is a 77 year old white female with  hyperlipidemia, hypothyroidism, and essential hypertension presenting at  this time with abdominal pain, nausea and vomiting.  The patient evidently  has had partial bouts of small bowel obstruction treated through the  emergency room and as outpatient on at least two to three occasions.  She  has had prior abdominal surgery to include cholecystectomy, laparotomy with  colon resection, and hysterectomy.  She relates a vague lower abdominal  gas/pain for two days and development of abrupt nausea and vomiting last  evening persisting all night long.  She has had intolerance to p.o. fluids.  Last bowel movement was Sunday morning and consisted of three small loose  stools.  At this time, following antiemetics, nausea and vomiting have  settled but she is still intolerant of p.o. fluids.  She has had no fever,  chills, or sweats.  She has had no urinary symptoms.  She has had no bloody  stools or bloody emesis.  She has had no chest pain or shortness of breath.  She is admitted at this time for IV fluids.   PAST MEDICAL HISTORY:  Allergies:  PENICILLIN.   MEDICATIONS:  1. Zocor 40 mg p.o. daily.  2. Zetia 10 mg p.o. daily.  3. Levothroid 0.175 mg p.o. daily.  4. Diovan 320 mg p.o. daily.  5. A hormone shot from Daniel L. Eda Paschal, M.D.   Medical illnesses include hyperlipidemia, hypertension, hypothyroidism, and  multiple bouts of small bowel obstruction treated outpatient.   Surgical  illnesses include remote cholecystectomy, total abdominal hysterectomy,  renal calculi, and laparotomy for benign colon resection.   FAMILY HISTORY:  Hypertension and lipids.   SOCIAL HISTORY:  The patient is married, has several children.  Does not  smoke or drink.   PHYSICAL EXAMINATION:  VITAL SIGNS:  Temperature 98, blood pressure 140/70,  pulse 80, respiratory rate is 18.  GENERAL:  The patient is pleasant, calm, conversant, in no distress.  SKIN:  Warm, nondiaphoretic.  HEENT:  Anicteric, good facial symmetry.  Oropharynx:  Dry mucosa.  NECK:  No JVD or bruits.  CHEST:  Lungs are clear.  CARDIAC:  Regular rate and rhythm, no murmur, gallop, rub, or heave.  ABDOMEN:  Soft, few bowel sounds right upper quadrant, otherwise quiet.  No  significant tenderness, no distention, no tympany, no rebound or guarding.  RECTAL:  Rectal vault is empty.  EXTREMITIES:  No cyanosis, clubbing, or edema.  Distal pulses intact.  Joints normal.  NEUROLOGIC:  She is nonlateralizing.   ASSESSMENT:  1. Nausea and vomiting with volume depletion.  Considerations include     recurrent partial small bowel obstruction, gastroenteritis, and/or     pancreatitis, albeit CT scan shows no evidence of diverticulitis or     abscess and no evidence of pancreatitis at this time.  2. Hyperlipidemia.  3. Essential hypertension.  4. Hypothyroidism.   PLAN:  The patient will be treated conservatively with IV fluids.  We will  withhold an NG tube at this time, as she seems to have settled down.  Keep  her on scant amounts of clear liquids.  White blood cell count is elevated,  but this may indeed be more demargination as no evidence of intra-abdominal  process exists, chest is clear, and there are no other clinical signs of  infection.  Doubt pancreatitis with the relatively normal CT scan.  For  further, see orders.                                                Geoffry Paradise, M.D.    RA/MEDQ   D:  01/24/2003  T:  01/24/2003  Job:  [id number]

## 2010-06-15 NOTE — Op Note (Signed)
NAMEJOSSLIN, Brianna [name]               ACCOUNT NO.:  [id number]   MEDICAL RECORD NO.:  [id number]          PATIENT TYPE:  AMB   LOCATION:  ENDO                         FACILITY:  [facility]   PHYSICIAN:  Anselmo Rod, M.D.  DATE OF BIRTH:  01-03-1933   DATE OF PROCEDURE:  07/13/2004  DATE OF DISCHARGE:                                 OPERATIVE REPORT   PROCEDURE PERFORMED:  Esophagogastroduodenoscopy with antral biopsies.   ENDOSCOPIST:  Anselmo Rod, M.D.   INSTRUMENT USED:  Olympus video panendoscope.   INDICATION FOR PROCEDURE:  A 77 year old white female with a history of  nausea.  Rule out gastric outlet obstruction, peptic ulcer disease,  esophagitis, gastritis, etc.   PREPROCEDURE PREPARATION:  Informed consent was procured from the patient.  The patient was fasted for eight hours prior to the procedure.   PREPROCEDURE PHYSICAL:  VITAL SIGNS:  The patient had stable vital signs.  NECK:  Supple.  CHEST:  Clear to auscultation.  S1, S2 regular.  ABDOMEN:  Soft with normal bowel sounds.   DESCRIPTION OF PROCEDURE:  The patient was placed in the left lateral  decubitus position and sedated with 75 mg of Demerol and 5 mg of Versed in  slow incremental doses.  Once the patient was adequately sedate and  maintained on low-flow oxygen and continuous cardiac monitoring, the Olympus  video panendoscope was advanced through the mouthpiece over the tongue, into  the esophagus under direct vision.  The entire esophagus appeared normal  with no evidence of ring, stricture, mass, esophagitis or Barrett's mucosa.  The scope was then advanced into the stomach.  Antral gastritis with  erosions was noted.  Biopsies were done for H. pylori by pathology.  No  large ulcers or masses were seen.  There was no evidence of outlet  obstruction.  The proximal small bowel appeared normal.   IMPRESSION:  Antral gastritis with multiple erosions, biopsy done for  Helicobacter pylori.   RECOMMENDATIONS:  1.  Continue PPIs.  2.  Avoid nonsteroidals.  3.  Treat with antibiotics if H. pylori present on biopsies.  4.  Proceed with a gastric emptying study as planned.  5.  Outpatient follow-up after the above-mentioned test results have been      procured.       JNM/MEDQ  D:  07/13/2004  T:  07/14/2004  Job:  [id number]   cc:   Jeannett Senior A. Evlyn Kanner, M.D.  [address]  [place]  [place] [address]  Fax: [phone]

## 2010-06-15 NOTE — Discharge Summary (Signed)
NAMETABOR, Brianna [name] NO.:  [id number]   MEDICAL RECORD NO.:  [id number]          PATIENT TYPE:  INP   LOCATION:  5740                         FACILITY:  [facility]   PHYSICIAN:  Kari Baars, M.D.  DATE OF BIRTH:  04/25/1932   DATE OF ADMISSION:  12/27/2004  DATE OF DISCHARGE:  12/29/2004                                 DISCHARGE SUMMARY   DISCHARGE DIAGNOSES:  1.  Intractable nausea/vomiting/diarrhea.  2.  Dehydration secondary to #1.  3.  Hyponatremia secondary to #1.  4.  Hypokalemia secondary to #1.  5.  Gastroparesis.  6.  Hypertension.  7.  Hyperlipidemia.  8.  Hypothyroidism.  9.  Nephrolithiasis.  10. History of small bowel obstruction (normal colonoscopy 2005).  11. Sleep apnea.  12. Benign colon resection in the past.  13. Status post total abdominal hysterectomy.  14. Status post cholecystectomy.  15. Nephrolithiasis status post kidney stone extraction.   DISCHARGE MEDICATIONS:  1.  Reglan 10 milligrams q.a.c. and q.h.s.  2.  Metamucil daily.  3.  Synthroid 175 mcg daily.  4.  Sular 10 milligrams daily.  5.  Nexium 40 milligrams b.i.d.  6.  Diovan 320/HCTZ 12.5 daily.  7.  Effexor XR 75 milligrams daily.  8.  Phenergan 25 milligrams q.6h. p.r.n. nausea.  9.  KCl 20 mEq b.i.d.  10. She states that she has stopped her Vytorin.   HOSPITAL PROCEDURES:  Acute abdominal series (December 27, 2004):  No acute  intracranial abnormalities. No evidence of bowel obstruction or acute  abnormality.   HISTORY OF PRESENT ILLNESS:  For full details, please see dictated history  and physical by Dr. Evlyn Kanner. Briefly, Ms. Albano is a 77 year old white  female with a history of hypertension, hyperlipidemia, hypothyroidism and  severe gastroparesis who presented with intractable nausea/vomiting/diarrhea  for several days prior to admission. This started prior to Thanksgiving and  progressed to the point she was unable to tolerate p.o.'s. She has had a  recent  outpatient evaluation that showed severe gastroparesis with 100%  retention at twp hours gastric emptying study. She had does have a history  of small bowel obstruction and colon resection for benign reasons. When she  presented with these symptoms above, she was found have significant  electrolyte disturbance with sodium 124, potassium 3.0, chloride 83,  bicarbonate of 29, BUN 6, creatinine 0.8. Given these electrolyte  abnormalities and inability to tolerate p.o.'s, she was admitted for further  management.   HOSPITAL COURSE:  The patient was admitted to medical bed. She was hydrated  with IV fluids and electrolyte repletion. She was given IV Reglan and IV PPI  therapy. An acute abdominal series was performed that showed no evidence of  obstruction. With these supportive measures, the patient's symptoms resolved  rapidly. She was able to tolerate p.o.'s and was tolerating a full diet on  the day of discharge. She had improvement in her electrolyte disturbance  with rehydration. Given resolution of her nausea and vomiting and  improvement in her electrolyte disturbance, the patient is stable for  discharge home with outpatient follow-up.  DISCHARGE LABS:  BMET on the day of discharge revealed sodium 131, potassium  3.2, chloride 94, bicarbonate 27, BUN 5, creatinine 0.8.   HOSPITAL FOLLOW-UP:  She will follow with Dr. Evlyn Kanner in 1-2 weeks. A BMET  will be performed on Tuesday. She is to follow up with Dr. Loreta Ave as scheduled  for outpatient appointment.   DISCHARGE INSTRUCTIONS:  She was instructed to call if she has recurrent  nausea, vomiting, inability to tolerate p.o.'s. She should increase her  fluid intake and follow-up as instructed.   DISCHARGE DIET:  Soft mechanical as tolerated. May advance to regular diet  as symptoms allow. If GI symptoms worsen, she should back off to a liquid  diet. She understands this.   DISPOSITION:  To home.      Kari Baars, M.D.   Electronically Signed     WS/MEDQ  D:  12/29/2004  T:  12/29/2004  Job:  [id number]   cc:   Anselmo Rod, M.D.  Fax: [phone]   Tera Mater. Evlyn Kanner, M.D.  Fax: [phone]

## 2010-06-15 NOTE — Op Note (Signed)
NAME:  Brianna [name], Brianna [name]                         ACCOUNT NO.:  [id number]   MEDICAL RECORD NO.:  [id number]                   PATIENT TYPE:  AMB   LOCATION:  ENDO                                 FACILITY:  [facility]   PHYSICIAN:  Anselmo Rod, M.D.               DATE OF BIRTH:  01/24/1933   DATE OF PROCEDURE:  08/31/2003  DATE OF DISCHARGE:                                 OPERATIVE REPORT   PROCEDURE:  Screening colonoscopy.   ENDOSCOPIST:  Charna Elizabeth, M.D.   INSTRUMENT USED:  Olympus video colonoscope.   INDICATIONS FOR PROCEDURE:  77 year old white female status post right  hemicolectomy for a large tubular villous adenoma undergoing repeat  colonoscopy for surveillance purposes, rule out recurrent polyps.   PREPROCEDURE PREPARATION:  Informed consent was obtained from the patient.  The patient was fasted for eight hours prior to the procedure and prepped  with a bottle of magnesium citrate and a gallon of GoLYTELY the night prior  to the procedure.   PREPROCEDURE PHYSICAL:  Patient with stable vital signs.  Neck supple.  Chest clear to auscultation.  S1 and S2 regular.  Abdomen soft with normal  bowel sounds.   DESCRIPTION OF PROCEDURE:  The patient was placed in the left lateral  decubitus position, sedated with 75 mg of Demerol and 7.5 mg Versed in slow,  incremental doses.  Once the patient was adequately sedated, maintained on  low flow oxygen and continuous cardiac monitoring, the Olympus video  colonoscope was advanced into the rectum to 90 cm.  There was a large amount  of residual stool in the dependent areas of the colon.  Multiple washings  were done.  The patient has evidence of a right hemicolectomy.  The  anastomosis seemed healthy.  The distal small bowel appeared normal, as  well.  There were a few scattered diverticula throughout the colon.  Small  internal hemorrhoids were appreciated on retroflexion of the rectum.  No  masses or polyps were seen.  Small  lesions could have been missed.  The  patient tolerated the procedure well without immediate complications.   IMPRESSION:  1. Scattered diverticulosis.  2. Small internal hemorrhoids.  3. Status post right hemicolectomy with normal distal small bowel.  4. Some residual stool in the colon, small lesions could have been missed.   RECOMMENDATIONS:  1. Continue a high fiber diet with liberal fluid intake.  2. Repeat colonoscopy in the next three years unless the patient develops     any abnormal symptoms in the interim.  3. Outpatient follow up as need arises in the future.                                               Anselmo Rod, M.D.    JNM/MEDQ  D:  08/31/2003  T:  08/31/2003  Job:  [id number]   cc:   Jeannett Senior A. Evlyn Kanner, M.D.  [address]  [place]  [place] [address]  Fax: [phone]   Rande Brunt. Eda Paschal, M.D.  [address], [address]  [place]  [place] [address]  Fax: [phone]   Sandria Bales. Ezzard Standing, M.D.  [address]  [place]  [place] [address]  Fax: [phone]

## 2010-11-27 ENCOUNTER — Telehealth: Payer: Self-pay | Admitting: *Deleted

## 2010-11-27 MED ORDER — ESTROGENS CONJUGATED 1.25 MG PO TABS: 1.2500 mg | ORAL_TABLET | Freq: Every day | ORAL | Status: DC

## 2010-11-27 NOTE — Telephone Encounter (Signed)
Brianna [name] is on back til middle of Dec. Pt is on 1.25mg . PLease advise an alternative

## 2010-11-27 NOTE — Telephone Encounter (Signed)
Premarin 1.25 mg daily

## 2011-01-23 ENCOUNTER — Other Ambulatory Visit: Payer: Self-pay | Admitting: Obstetrics and Gynecology

## 2011-04-15 ENCOUNTER — Other Ambulatory Visit: Payer: Self-pay | Admitting: Obstetrics and Gynecology

## 2011-04-15 DIAGNOSIS — Z1231 Encounter for screening mammogram for malignant neoplasm of breast: Secondary | ICD-10-CM

## 2011-05-06 ENCOUNTER — Encounter: Payer: Self-pay | Admitting: Gynecology

## 2011-05-06 DIAGNOSIS — E7800 Pure hypercholesterolemia, unspecified: Secondary | ICD-10-CM | POA: Insufficient documentation

## 2011-05-06 DIAGNOSIS — I10 Essential (primary) hypertension: Secondary | ICD-10-CM | POA: Insufficient documentation

## 2011-05-06 DIAGNOSIS — N938 Other specified abnormal uterine and vaginal bleeding: Secondary | ICD-10-CM | POA: Insufficient documentation

## 2011-05-06 DIAGNOSIS — N200 Calculus of kidney: Secondary | ICD-10-CM | POA: Insufficient documentation

## 2011-05-06 DIAGNOSIS — F0390 Unspecified dementia without behavioral disturbance: Secondary | ICD-10-CM | POA: Insufficient documentation

## 2011-05-14 ENCOUNTER — Other Ambulatory Visit: Payer: Self-pay | Admitting: Obstetrics and Gynecology

## 2011-05-14 ENCOUNTER — Ambulatory Visit (INDEPENDENT_AMBULATORY_CARE_PROVIDER_SITE_OTHER): Payer: Medicare Other | Admitting: Obstetrics and Gynecology

## 2011-05-14 ENCOUNTER — Encounter: Payer: Self-pay | Admitting: Obstetrics and Gynecology

## 2011-05-14 VITALS — BP 130/74 | Ht 60.0 in | Wt 132.0 lb

## 2011-05-14 DIAGNOSIS — Z780 Asymptomatic menopausal state: Secondary | ICD-10-CM

## 2011-05-14 DIAGNOSIS — N952 Postmenopausal atrophic vaginitis: Secondary | ICD-10-CM

## 2011-05-14 DIAGNOSIS — N390 Urinary tract infection, site not specified: Secondary | ICD-10-CM

## 2011-05-14 DIAGNOSIS — F0390 Unspecified dementia without behavioral disturbance: Secondary | ICD-10-CM

## 2011-05-14 DIAGNOSIS — N951 Menopausal and female climacteric states: Secondary | ICD-10-CM

## 2011-05-14 MED ORDER — ESTROGENS CONJUGATED 1.25 MG PO TABS: 1.2500 mg | ORAL_TABLET | Freq: Every day | ORAL | Status: DC

## 2011-05-14 NOTE — Progress Notes (Signed)
Patient came to see me today for further follow up. She continues to require estrogen replacement. For a while we had her on Enjuvia. This became more expensive and she is now doing well on Premarin. This was also treated her atrophic vaginitis. She is up-to-date on mammograms. She's had several normal bone densities. She's had a lot of trouble with nausea. She is seeing a GI doctor in [place] who has done imaging studies on her and feels is due to her dementia. She is having no vaginal bleeding. She is having no pelvic pain. She denies frequency of urination, urgency, incontinence or dysuria. Her daughter was present during the visit as the patient is not a good historian with her dementia.  ROS: 12 system review done. Pertinent positives above. Other positives include hypertension, hyperlipidemia, and kidney stone.  HEENT: Within normal limits. Kennon Portela present. Neck: No masses. Supraclavicular lymph nodes: Not enlarged. Breasts: Examined in both sitting and lying position. Symmetrical without skin changes or masses. Abdomen: Soft no masses guarding or rebound. No hernias. Pelvic: External within normal limits. BUS within normal limits. Vaginal examination shows good estrogen effect, no cystocele enterocele or rectocele. Cervix and uterus absent. Adnexa within normal limits. Rectovaginal confirmatory. Extremities within normal limits.  Assessment: Menopausal symptoms. Dementia  Plan: Continue Premarin. Continue yearly mammograms.

## 2011-05-15 LAB — URINALYSIS W MICROSCOPIC + REFLEX CULTURE
Bilirubin Urine: NEGATIVE
Glucose, UA: NEGATIVE mg/dL
Hgb urine dipstick: NEGATIVE
Protein, ur: NEGATIVE mg/dL

## 2011-05-23 ENCOUNTER — Ambulatory Visit: Payer: Medicare Other

## 2011-06-04 ENCOUNTER — Ambulatory Visit: Payer: Medicare Other

## 2012-05-18 ENCOUNTER — Other Ambulatory Visit: Payer: Self-pay

## 2012-05-18 ENCOUNTER — Other Ambulatory Visit: Payer: Self-pay | Admitting: Obstetrics and Gynecology

## 2012-05-18 DIAGNOSIS — Z1231 Encounter for screening mammogram for malignant neoplasm of breast: Secondary | ICD-10-CM

## 2012-06-11 ENCOUNTER — Encounter: Payer: Self-pay | Admitting: Gynecology

## 2012-06-12 ENCOUNTER — Ambulatory Visit
Admission: RE | Admit: 2012-06-12 | Discharge: 2012-06-12 | Disposition: A | Discharge status: Home / Self Care | Payer: BLUE CROSS/BLUE SHIELD | Source: Ambulatory Visit

## 2012-06-12 DIAGNOSIS — Z1231 Encounter for screening mammogram for malignant neoplasm of breast: Secondary | ICD-10-CM

## 2012-06-15 ENCOUNTER — Other Ambulatory Visit: Payer: Self-pay | Admitting: *Deleted

## 2012-06-15 DIAGNOSIS — R928 Other abnormal and inconclusive findings on diagnostic imaging of breast: Secondary | ICD-10-CM

## 2012-06-15 DIAGNOSIS — N6310 Unspecified lump in the right breast, unspecified quadrant: Secondary | ICD-10-CM

## 2012-06-27 ENCOUNTER — Other Ambulatory Visit: Payer: Self-pay | Admitting: Gynecology

## 2012-06-30 ENCOUNTER — Other Ambulatory Visit (HOSPITAL_COMMUNITY): Payer: Self-pay | Admitting: *Deleted

## 2012-06-30 ENCOUNTER — Ambulatory Visit (HOSPITAL_COMMUNITY)
Admission: RE | Admit: 2012-06-30 | Discharge: 2012-06-30 | Disposition: A | Discharge status: Home / Self Care | Payer: Medicare Other | Source: Ambulatory Visit | Attending: Endocrinology | Admitting: Endocrinology

## 2012-06-30 ENCOUNTER — Ambulatory Visit
Admission: RE | Admit: 2012-06-30 | Discharge: 2012-06-30 | Disposition: A | Discharge status: Home / Self Care | Payer: Medicare Other | Source: Ambulatory Visit | Attending: Gynecology | Admitting: Gynecology

## 2012-06-30 ENCOUNTER — Ambulatory Visit: Payer: Medicare Other

## 2012-06-30 DIAGNOSIS — R928 Other abnormal and inconclusive findings on diagnostic imaging of breast: Secondary | ICD-10-CM

## 2012-06-30 DIAGNOSIS — D509 Iron deficiency anemia, unspecified: Secondary | ICD-10-CM | POA: Insufficient documentation

## 2012-06-30 DIAGNOSIS — N6310 Unspecified lump in the right breast, unspecified quadrant: Secondary | ICD-10-CM

## 2012-06-30 MED ORDER — SODIUM CHLORIDE 0.9 % IV SOLN
1020.0000 mg | Freq: Once | INTRAVENOUS | Status: AC
  Administered 2012-06-30: 1020 mg via INTRAVENOUS
  Filled 2012-06-30: qty 34

## 2012-07-06 ENCOUNTER — Ambulatory Visit (HOSPITAL_COMMUNITY): Payer: Medicare Other

## 2012-07-07 ENCOUNTER — Encounter: Payer: Self-pay | Admitting: Gynecology

## 2012-07-07 ENCOUNTER — Ambulatory Visit (INDEPENDENT_AMBULATORY_CARE_PROVIDER_SITE_OTHER): Payer: Medicare Other | Admitting: Gynecology

## 2012-07-07 VITALS — BP 140/74 | Ht 60.0 in | Wt 135.0 lb

## 2012-07-07 DIAGNOSIS — R21 Rash and other nonspecific skin eruption: Secondary | ICD-10-CM

## 2012-07-07 DIAGNOSIS — N816 Rectocele: Secondary | ICD-10-CM

## 2012-07-07 DIAGNOSIS — Z79890 Hormone replacement therapy (postmenopausal): Secondary | ICD-10-CM

## 2012-07-07 DIAGNOSIS — N952 Postmenopausal atrophic vaginitis: Secondary | ICD-10-CM

## 2012-07-07 DIAGNOSIS — N951 Menopausal and female climacteric states: Secondary | ICD-10-CM

## 2012-07-07 DIAGNOSIS — Z780 Asymptomatic menopausal state: Secondary | ICD-10-CM

## 2012-07-07 MED ORDER — NYSTATIN-TRIAMCINOLONE 100000-0.1 UNIT/GM-% EX OINT: TOPICAL_OINTMENT | Freq: Two times a day (BID) | CUTANEOUS | Status: DC

## 2012-07-07 NOTE — Progress Notes (Addendum)
Brianna [name] [birth year] [id number]        79 y.o.  G3P3003 for followup exam.  Former patient of Dr. Eda Paschal. Several issues noted below.  Past medical history,surgical history, medications, allergies, family history and social history were all reviewed and documented in the EPIC chart.  ROS:  Performed and pertinent positives and negatives are included in the history, assessment and plan .  Exam: Kim assistant Filed Vitals:   07/07/12 1436  BP: 140/74  Height: 5' (1.524 m)  Weight: 135 lb (61.236 kg)   General appearance  Normal Skin grossly normal Head/Neck normal with no cervical or supraclavicular adenopathy thyroid normal Lungs  clear Cardiac RR, without RMG Abdominal  soft, nontender, without masses, organomegaly or hernia Breasts  examined lying and sitting without masses, retractions, discharge or axillary adenopathy. Slight rash between breasts consistent with heat/fungal Pelvic  Ext/BUS/vagina  atrophic changes. First degree rectocele on digital exam   Adnexa  Without masses or tenderness    Anus and perineum  normal   Rectovaginal  normal sphincter tone without palpated masses or tenderness. Confirms first-degree rectocele   Assessment/Plan:  79 y.o. G52P3003 female for annual exam. Patient does have dementia and is accompanied by her daughter who is her daily care giver.  1. HRT. Patient is on Enjuvia 1.25 mg. Having hot flashes and sweats. Had tried stopping previously but had an exacerbation of her symptoms which were unacceptable to her and Dr. Eda Paschal continued her on the HRT. Status post TAH in the past.  I reviewed the whole issue of HRT with her to include the WHI study with increased risk of stroke, heart attack, DVT and breast cancer. The ACOG and NAMS statements for lowest dose for the shortest period of time reviewed. Transdermal versus oral first-pass effect benefit discussed. I discussed the use in a 79 year old woman. After lengthy discussion the  patient's daughter who accompanied her due to her dementia states that they want to go ahead and try to wean. She'll do this over the next several weeks. If they find the hot flashes/sweats unacceptable and understand and accept the above risks then the daughter will call for refill and she will continue. If she does well after stopping then she will stay off the medication. 2. Skin rash. Patient has a slight skin rash between her breasts. Was a lot worse before but the daughter has been applying cornstarch. Consistent with a classic heat/fungal rash. Mytrex prescribed to be used when necessary with one refill. 3. Rectocele. Patient has mild rectocele on exam. Asymptomatic and we'll continue to monitor. 4. Pap smear 2012.  No Pap smear done today. Reviewed current screening guidelines. She is over the age of 70 and status post hysterectomy I recommend to stop screening and they agree with this. 5. Mammography 2014. We'll continue with annual mammography. SBE monthly reviewed. 6. DEXA 2003. Recommend repeat now and they agreed to schedule. Increase calcium vitamin D reviewed. 7. Colonoscopy a number of years ago.  Will address with Dr. Evlyn Kanner as to scheduling. 8. Health maintenance. No lab work and this is all done through Dr. Rinaldo Cloud office. Followup one year, sooner as needed.    Dara Lords MD, 3:01 PM 07/07/2012

## 2012-07-07 NOTE — Patient Instructions (Addendum)
Followup for bone density as scheduled. 

## 2012-08-06 ENCOUNTER — Other Ambulatory Visit: Payer: Self-pay | Admitting: Gynecology

## 2012-08-06 NOTE — Telephone Encounter (Signed)
05/14/11 CE Dr. Reece Agar wrote "She continues to require estrogen replacement. For a while we had her on Enjuvia. This became more expensive and she is now doing well on Premarin. "   Dr. Velvet Bathe had noted at her 06/2012 CE she was on Enjuvia but patient actually taking Premarin.

## 2012-08-19 ENCOUNTER — Other Ambulatory Visit: Payer: Self-pay | Admitting: Neurology

## 2012-09-02 ENCOUNTER — Ambulatory Visit: Payer: Self-pay | Admitting: Nurse Practitioner

## 2012-09-26 ENCOUNTER — Other Ambulatory Visit: Payer: Self-pay | Admitting: Neurology

## 2012-11-10 ENCOUNTER — Ambulatory Visit: Payer: Medicare Other | Admitting: Nurse Practitioner

## 2012-11-17 ENCOUNTER — Encounter: Payer: Self-pay | Admitting: Nurse Practitioner

## 2012-11-17 ENCOUNTER — Ambulatory Visit (INDEPENDENT_AMBULATORY_CARE_PROVIDER_SITE_OTHER): Payer: Medicare Other | Admitting: Nurse Practitioner

## 2012-11-17 VITALS — BP 158/71 | HR 69 | Ht 61.0 in | Wt 134.0 lb

## 2012-11-17 DIAGNOSIS — F0390 Unspecified dementia without behavioral disturbance: Secondary | ICD-10-CM

## 2012-11-17 DIAGNOSIS — G4710 Hypersomnia, unspecified: Secondary | ICD-10-CM

## 2012-11-17 DIAGNOSIS — F068 Other specified mental disorders due to known physiological condition: Secondary | ICD-10-CM

## 2012-11-17 MED ORDER — GALANTAMINE HYDROBROMIDE ER 16 MG PO CP24: 16.0000 mg | ORAL_CAPSULE | Freq: Every day | ORAL | Status: DC

## 2012-11-17 NOTE — Progress Notes (Signed)
[facility]  PATIENT: Brianna [name] DOB: January 17, 1933   REASON FOR VISIT: Followup for memory   HISTORY OF PRESENT ILLNESS:79year old caucasian female is a long established patient in our practice and carries the diagnosis of dementia, had been seen after acute decompensation and delirium in intial visit from August 2006. Last visit to our office 02/20/12.   Dicussed then that it was Alzheimer's type dementia . She has been on Excelon and Ambien  and could not tolerate it, changed to Galantamine ER at  16 mg . EEG was remarkably normal . She has  facial automatisms , orofacial movements. Her daughter leads the conversation.  Mrs. Beyersdorf is independent with ADL's.   Her daughter reports a good responce to Pristique and feels her mother is less depressed.  GDS =3 today. She is still not easily motivated to participate in activities. She is very passive, does not exercise.  Had GI upset to Axona. Has not been sleeping well for many years.  lunesta, seroquel and ambien, benadryl,  all tried and failed. She denies sleepiness in day time , cyclic sleep and wakefulness changes.  Denies side effects to Galantamine. Memory stable per daughter. Appetite good.      REVIEW OF SYSTEMS: Full 14 system review of systems performed and notable only for:  Constitutional: N/A  Cardiovascular: N/A  Ear/Nose/Throat: N/A  Skin: N/A  Eyes: N/A  Respiratory: N/A  Gastroitestinal: N/A  Hematology/Lymphatic: N/A  Endocrine: N/A Musculoskeletal:N/A  Allergy/Immunology: N/A  Neurological: Memory loss Psychiatric: N/A   ALLERGIES: Allergies  Allergen Reactions  . Penicillins     HOME MEDICATIONS: Outpatient Prescriptions Prior to Visit  Medication Sig Dispense Refill  . desvenlafaxine (PRISTIQ) 50 MG 24 hr tablet Take 50 mg by mouth daily.      Marland Kitchen esomeprazole (NEXIUM) 40 MG capsule Take 40 mg by mouth 2 (two) times daily.      Marland Kitchen galantamine (RAZADYNE ER) 16 MG 24 hr capsule TAKE  ONE CAPSULE BY MOUTH EVERY DAY  30 capsule  3  . IRON PO Take 1 tablet by mouth 3 (three) times daily.       Marland Kitchen levothyroxine (SYNTHROID, LEVOTHROID) 137 MCG tablet Take 137 mcg by mouth daily.      . nisoldipine (SULAR) 17 MG 24 hr tablet Take 17 mg by mouth daily.      . valsartan (DIOVAN) 320 MG tablet Take 320 mg by mouth daily.      Marland Kitchen nystatin-triamcinolone ointment (MYCOLOG) Apply topically 2 (two) times daily.  30 g  1  . PREMARIN 1.25 MG tablet TAKE 1 TABLET BY MOUTH EVERY DAY  30 tablet  2   No facility-administered medications prior to visit.    PAST MEDICAL HISTORY: Past Medical History  Diagnosis Date  . DUB (dysfunctional uterine bleeding)   . Thyroid disease     Hypothyroid  . Hypertension   . Dementia   . Elevated cholesterol   . Kidney stone     PAST SURGICAL HISTORY: Past Surgical History  Procedure Laterality Date  . Cholecystectomy    . Kidney stones    . Colon tumor-benign    . Abdominal hysterectomy      FAMILY HISTORY: Family History  Problem Relation Age of Onset  . Hypertension Brother   . Heart disease Brother   . Diabetes type II Brother   . Diabetes type II Sister     SOCIAL HISTORY: History   Social History  . Marital Status: Married  Spouse Name: N/A    Number of Children: N/A  . Years of Education: N/A   Occupational History  . Not on file.   Social History Main Topics  . Smoking status: Never Smoker   . Smokeless tobacco: Never Used  . Alcohol Use: No  . Drug Use: No  . Sexual Activity: No   Other Topics Concern  . Not on file   Social History Narrative  . No narrative on file     PHYSICAL EXAM  Filed Vitals:   11/17/12 1450  BP: 158/71  Pulse: 69  Height: 5\' 1"  (1.549 m)  Weight: 134 lb (60.782 kg)   Body mass index is 25.33 kg/(m^2).  Generalized: Well developed, in no acute distress  Head: normocephalic and atraumatic,. Oropharynx benign  Neck: Supple, no carotid bruits  Cardiac: Regular rate rhythm,  no murmur    Neurological examination   Mentation: Alert  MMSE 20/30. GDS=3. Follows all commands speech is fragmented, yes no answers.   Cranial nerve II-XII: .Pupils were equal round reactive to light extraocular movements were full, visual field were full on confrontational test. Facial sensation and strength were normal. hearing was intact to finger rubbing bilaterally. Uvula tongue midline. head turning and shoulder shrug and were normal and symmetric.Tongue protrusion into cheek strength was normal. Motor: normal bulk and tone, full strength in the BUE, BLE, fine finger movements normal, no pronator drift. No focal weakness Coordination: finger-nose-finger, performed smoothly no dysmetria Reflexes: Brachioradialis 2/2, biceps 2/2, triceps 2/2, patellar 2/2, Achilles 2/2, plantar responses were flexor bilaterally. Gait and Station: Rising up from seated position without assistance, normal stance,  moderate stride, good arm swing, smooth turning, able to perform tiptoe, and heel walking without difficulty. Mildly unsteady with tandem   DIAGNOSTIC DATA (LABS, IMAGING, TESTING) -None to review   ASSESSMENT AND PLAN  79 y.o. year old female  has a past medical of  Dementia; here for followup. Currently on Razadyne without side effects.   Memory score is stable Continue Razadyne will refill Plenty of water for hydration, limit pepsi intake F/U in 6 months Nilda Riggs, [facility], [facility], APRN  [facility] [address], [address] [place], [place] [address] [phone]

## 2012-11-17 NOTE — Patient Instructions (Signed)
Memory score is stable Continue Razadyne will refill Plenty of water for hydration, limit pepsi intake F/U in 6 months

## 2013-01-28 DIAGNOSIS — D509 Iron deficiency anemia, unspecified: Secondary | ICD-10-CM | POA: Diagnosis not present

## 2013-01-28 DIAGNOSIS — E039 Hypothyroidism, unspecified: Secondary | ICD-10-CM | POA: Diagnosis not present

## 2013-01-28 DIAGNOSIS — E871 Hypo-osmolality and hyponatremia: Secondary | ICD-10-CM | POA: Diagnosis not present

## 2013-02-13 ENCOUNTER — Emergency Department (HOSPITAL_COMMUNITY): Payer: Medicare Other

## 2013-02-13 ENCOUNTER — Encounter (HOSPITAL_COMMUNITY): Payer: Self-pay | Admitting: Emergency Medicine

## 2013-02-13 ENCOUNTER — Inpatient Hospital Stay (HOSPITAL_COMMUNITY)
Admission: EM | Admit: 2013-02-13 | Discharge: 2013-02-14 | DRG: 641 | Disposition: A | Discharge status: Home / Self Care | Payer: Medicare Other | Attending: Endocrinology | Admitting: Endocrinology

## 2013-02-13 DIAGNOSIS — R531 Weakness: Secondary | ICD-10-CM

## 2013-02-13 DIAGNOSIS — Z880 Allergy status to penicillin: Secondary | ICD-10-CM

## 2013-02-13 DIAGNOSIS — Z8249 Family history of ischemic heart disease and other diseases of the circulatory system: Secondary | ICD-10-CM | POA: Diagnosis not present

## 2013-02-13 DIAGNOSIS — G309 Alzheimer's disease, unspecified: Secondary | ICD-10-CM | POA: Diagnosis present

## 2013-02-13 DIAGNOSIS — Z79899 Other long term (current) drug therapy: Secondary | ICD-10-CM | POA: Diagnosis not present

## 2013-02-13 DIAGNOSIS — I10 Essential (primary) hypertension: Secondary | ICD-10-CM | POA: Diagnosis present

## 2013-02-13 DIAGNOSIS — R5381 Other malaise: Secondary | ICD-10-CM | POA: Diagnosis not present

## 2013-02-13 DIAGNOSIS — F0280 Dementia in other diseases classified elsewhere without behavioral disturbance: Secondary | ICD-10-CM | POA: Diagnosis present

## 2013-02-13 DIAGNOSIS — M6281 Muscle weakness (generalized): Secondary | ICD-10-CM | POA: Diagnosis not present

## 2013-02-13 DIAGNOSIS — Z87442 Personal history of urinary calculi: Secondary | ICD-10-CM | POA: Diagnosis not present

## 2013-02-13 DIAGNOSIS — E871 Hypo-osmolality and hyponatremia: Secondary | ICD-10-CM | POA: Diagnosis not present

## 2013-02-13 DIAGNOSIS — E039 Hypothyroidism, unspecified: Secondary | ICD-10-CM | POA: Diagnosis not present

## 2013-02-13 DIAGNOSIS — Z833 Family history of diabetes mellitus: Secondary | ICD-10-CM | POA: Diagnosis not present

## 2013-02-13 DIAGNOSIS — F0390 Unspecified dementia without behavioral disturbance: Secondary | ICD-10-CM | POA: Diagnosis present

## 2013-02-13 DIAGNOSIS — R404 Transient alteration of awareness: Secondary | ICD-10-CM | POA: Diagnosis not present

## 2013-02-13 DIAGNOSIS — R55 Syncope and collapse: Secondary | ICD-10-CM | POA: Diagnosis not present

## 2013-02-13 HISTORY — DX: Weakness: R53.1

## 2013-02-13 LAB — CBC WITH DIFFERENTIAL/PLATELET
BASOS PCT: 0 % (ref 0–1)
Basophils Absolute: 0 10*3/uL (ref 0.0–0.1)
EOS PCT: 0 % (ref 0–5)
Eosinophils Absolute: 0 10*3/uL (ref 0.0–0.7)
HEMATOCRIT: 31.3 % — AB (ref 36.0–46.0)
HEMOGLOBIN: 10.6 g/dL — AB (ref 12.0–15.0)
LYMPHS PCT: 9 % — AB (ref 12–46)
Lymphs Abs: 0.7 10*3/uL (ref 0.7–4.0)
MCH: 26.9 pg (ref 26.0–34.0)
MCHC: 33.9 g/dL (ref 30.0–36.0)
MCV: 79.4 fL (ref 78.0–100.0)
MONO ABS: 1 10*3/uL (ref 0.1–1.0)
MONOS PCT: 12 % (ref 3–12)
NEUTROS ABS: 6.4 10*3/uL (ref 1.7–7.7)
Neutrophils Relative %: 79 % — ABNORMAL HIGH (ref 43–77)
Platelets: 313 10*3/uL (ref 150–400)
RBC: 3.94 MIL/uL (ref 3.87–5.11)
RDW: 12.3 % (ref 11.5–15.5)
WBC: 8.1 10*3/uL (ref 4.0–10.5)

## 2013-02-13 LAB — COMPREHENSIVE METABOLIC PANEL
ALT: 11 U/L (ref 0–35)
AST: 21 U/L (ref 0–37)
Albumin: 4 g/dL (ref 3.5–5.2)
Alkaline Phosphatase: 53 U/L (ref 39–117)
BILIRUBIN TOTAL: 0.5 mg/dL (ref 0.3–1.2)
BUN: 14 mg/dL (ref 6–23)
CALCIUM: 9.7 mg/dL (ref 8.4–10.5)
CHLORIDE: 83 meq/L — AB (ref 96–112)
CO2: 26 meq/L (ref 19–32)
CREATININE: 1.19 mg/dL — AB (ref 0.50–1.10)
GFR calc Af Amer: 49 mL/min — ABNORMAL LOW (ref >90)
GFR, EST NON AFRICAN AMERICAN: 43 mL/min — AB (ref >90)
Glucose, Bld: 86 mg/dL (ref 70–99)
Potassium: 3.7 mEq/L (ref 3.7–5.3)
Sodium: 124 mEq/L — ABNORMAL LOW (ref 137–147)
Total Protein: 7.6 g/dL (ref 6.0–8.3)

## 2013-02-13 LAB — URINALYSIS, ROUTINE W REFLEX MICROSCOPIC
BILIRUBIN URINE: NEGATIVE
Glucose, UA: NEGATIVE mg/dL
Hgb urine dipstick: NEGATIVE
KETONES UR: NEGATIVE mg/dL
Leukocytes, UA: NEGATIVE
NITRITE: NEGATIVE
PROTEIN: NEGATIVE mg/dL
Specific Gravity, Urine: 1.009 (ref 1.005–1.030)
Urobilinogen, UA: 0.2 mg/dL (ref 0.0–1.0)
pH: 5.5 (ref 5.0–8.0)

## 2013-02-13 MED ORDER — ENOXAPARIN SODIUM 40 MG/0.4ML ~~LOC~~ SOLN
40.0000 mg | SUBCUTANEOUS | Status: DC
  Administered 2013-02-13: 40 mg via SUBCUTANEOUS
  Filled 2013-02-13 (×3): qty 0.4

## 2013-02-13 MED ORDER — IRBESARTAN 300 MG PO TABS
300.0000 mg | ORAL_TABLET | Freq: Every day | ORAL | Status: DC
Start: 2013-02-13 — End: 2013-02-14
  Administered 2013-02-13 – 2013-02-14 (×2): 300 mg via ORAL
  Filled 2013-02-13 (×3): qty 1

## 2013-02-13 MED ORDER — SODIUM CHLORIDE 0.9 % IV SOLN
INTRAVENOUS | Status: DC
  Administered 2013-02-13 – 2013-02-14 (×3): via INTRAVENOUS

## 2013-02-13 MED ORDER — VITAMIN D (ERGOCALCIFEROL) 1.25 MG (50000 UNIT) PO CAPS: 50000.0000 [IU] | ORAL_CAPSULE | ORAL | Status: DC

## 2013-02-13 MED ORDER — LEVOTHYROXINE SODIUM 88 MCG PO TABS
88.0000 ug | ORAL_TABLET | Freq: Every day | ORAL | Status: DC
  Administered 2013-02-14: 88 ug via ORAL
  Filled 2013-02-13 (×2): qty 1

## 2013-02-13 MED ORDER — GALANTAMINE HYDROBROMIDE ER 16 MG PO CP24
16.0000 mg | ORAL_CAPSULE | Freq: Every day | ORAL | Status: DC
  Administered 2013-02-14: 16 mg via ORAL

## 2013-02-13 MED ORDER — POLYSACCHARIDE IRON COMPLEX 150 MG PO CAPS
150.0000 mg | ORAL_CAPSULE | Freq: Every day | ORAL | Status: DC
  Administered 2013-02-13 – 2013-02-14 (×2): 150 mg via ORAL
  Filled 2013-02-13 (×3): qty 1

## 2013-02-13 MED ORDER — ONDANSETRON HCL 4 MG/2ML IJ SOLN
4.0000 mg | Freq: Four times a day (QID) | INTRAMUSCULAR | Status: DC | PRN
  Administered 2013-02-13: 4 mg via INTRAVENOUS
  Filled 2013-02-13: qty 2

## 2013-02-13 MED ORDER — ZOLPIDEM TARTRATE 5 MG PO TABS
5.0000 mg | ORAL_TABLET | Freq: Every evening | ORAL | Status: DC | PRN
  Administered 2013-02-13: 5 mg via ORAL
  Filled 2013-02-13: qty 1

## 2013-02-13 MED ORDER — POLYETHYLENE GLYCOL 3350 17 G PO PACK
17.0000 g | PACK | Freq: Every day | ORAL | Status: DC | PRN
  Filled 2013-02-13: qty 1

## 2013-02-13 MED ORDER — SODIUM CHLORIDE 0.9 % IV BOLUS (SEPSIS)
1000.0000 mL | Freq: Once | INTRAVENOUS | Status: AC
  Administered 2013-02-13: 1000 mL via INTRAVENOUS

## 2013-02-13 MED ORDER — ALUM & MAG HYDROXIDE-SIMETH 200-200-20 MG/5ML PO SUSP: 30.0000 mL | Freq: Four times a day (QID) | ORAL | Status: DC | PRN

## 2013-02-13 MED ORDER — ONDANSETRON HCL 4 MG PO TABS: 4.0000 mg | ORAL_TABLET | Freq: Four times a day (QID) | ORAL | Status: DC | PRN

## 2013-02-13 MED ORDER — NISOLDIPINE ER 17 MG PO TB24
17.0000 mg | ORAL_TABLET | Freq: Every day | ORAL | Status: DC
  Administered 2013-02-13 – 2013-02-14 (×2): 17 mg via ORAL
  Filled 2013-02-13 (×3): qty 1

## 2013-02-13 MED ORDER — PANTOPRAZOLE SODIUM 40 MG PO TBEC
80.0000 mg | DELAYED_RELEASE_TABLET | Freq: Every day | ORAL | Status: DC
  Administered 2013-02-13 – 2013-02-14 (×2): 80 mg via ORAL
  Filled 2013-02-13 (×2): qty 2

## 2013-02-13 MED ORDER — ACETAMINOPHEN 325 MG PO TABS
650.0000 mg | ORAL_TABLET | Freq: Four times a day (QID) | ORAL | Status: DC | PRN
  Administered 2013-02-13 – 2013-02-14 (×2): 650 mg via ORAL
  Filled 2013-02-13 (×2): qty 2

## 2013-02-13 MED ORDER — VENLAFAXINE HCL ER 75 MG PO CP24
75.0000 mg | ORAL_CAPSULE | Freq: Every day | ORAL | Status: DC
  Administered 2013-02-14: 75 mg via ORAL
  Filled 2013-02-13 (×2): qty 1

## 2013-02-13 MED ORDER — ONDANSETRON 4 MG PO TBDP: 4.0000 mg | ORAL_TABLET | Freq: Once | ORAL | Status: DC

## 2013-02-13 NOTE — ED Provider Notes (Addendum)
CSN: [id number]     Arrival date & time 02/13/13  8527 History   First MD Initiated Contact with Patient 02/13/13 [phone]     Chief Complaint  Patient presents with  . Weakness   HPI  Patient presents here with her daughter. She has a history of Alzheimer's dementia. Her daughter lives right next door. Her daughter cares for her during the day, along with her elderly husband. Last night she had an episode trying to get to the restroom she was weak and nearly fell. She had to have help to him from the restroom which is unusual for her. This morning could not stand without the help of her daughter and husband. Daughter states she's not been eating well. She's not having vomiting or diarrhea or fluid loss. No fevers or cough. No fall or injury. No dysuria.  Past Medical History  Diagnosis Date  . DUB (dysfunctional uterine bleeding)   . Thyroid disease     Hypothyroid  . Hypertension   . Dementia   . Elevated cholesterol   . Kidney stone    Past Surgical History  Procedure Laterality Date  . Cholecystectomy    . Kidney stones    . Colon tumor-benign    . Abdominal hysterectomy     Family History  Problem Relation Age of Onset  . Hypertension Brother   . Heart disease Brother   . Diabetes type II Brother   . Diabetes type II Sister    History  Substance Use Topics  . Smoking status: Never Smoker   . Smokeless tobacco: Never Used  . Alcohol Use: No   OB History   Grav Para Term Preterm Abortions TAB SAB Ect Mult Living   3 3 3       3      Review of Systems  Constitutional: Positive for appetite change and fatigue. Negative for fever, chills, diaphoresis and unexpected weight change.  HENT: Negative for mouth sores, sore throat and trouble swallowing.   Eyes: Negative for visual disturbance.  Respiratory: Negative for cough, chest tightness, shortness of breath and wheezing.   Cardiovascular: Negative for chest pain.  Gastrointestinal: Negative for nausea, vomiting,  abdominal pain, diarrhea and abdominal distention.  Endocrine: Negative for polydipsia, polyphagia and polyuria.  Genitourinary: Negative for dysuria, frequency and hematuria.  Musculoskeletal: Negative for gait problem.  Skin: Negative for color change, pallor and rash.  Neurological: Positive for weakness. Negative for dizziness, syncope, light-headedness and headaches.  Hematological: Does not bruise/bleed easily.  Psychiatric/Behavioral: Negative for behavioral problems and confusion.   note: History and review of systems is provided by the daughter who is with her mother everyday has not noted any additional symptoms as above.  Allergies  Penicillins  Home Medications   Current Outpatient Rx  Name  Route  Sig  Dispense  Refill  . desvenlafaxine (PRISTIQ) 50 MG 24 hr tablet   Oral   Take 50 mg by mouth daily.         Marland Kitchen esomeprazole (NEXIUM) 40 MG capsule   Oral   Take 40 mg by mouth 2 (two) times daily.         Marland Kitchen galantamine (RAZADYNE ER) 16 MG 24 hr capsule   Oral   Take 1 capsule (16 mg total) by mouth daily with breakfast.   30 capsule   6   . iron polysaccharides (NIFEREX) 150 MG capsule   Oral   Take 150 mg by mouth daily.         Marland Kitchen  levothyroxine (SYNTHROID, LEVOTHROID) 88 MCG tablet   Oral   Take 88 mcg by mouth daily before breakfast.         . nisoldipine (SULAR) 17 MG 24 hr tablet   Oral   Take 17 mg by mouth daily.         . valsartan (DIOVAN) 320 MG tablet   Oral   Take 320 mg by mouth daily.         . Vitamin D, Ergocalciferol, (DRISDOL) 50000 UNITS CAPS capsule   Oral   Take 50,000 Units by mouth 2 (two) times a week. Tuesday and friday          BP 156/43  Pulse 59  Temp(Src) 98.6 F (37 C) (Oral)  Resp 16  SpO2 96% Physical Exam  Constitutional: She appears well-developed and well-nourished. No distress.  HENT:  Head: Normocephalic.  Tongue and mucosa are slightly dry  Eyes: Conjunctivae are normal. Pupils are equal,  round, and reactive to light. No scleral icterus.  Neck: Normal range of motion. Neck supple. No thyromegaly present.  Cardiovascular: Normal rate and regular rhythm.  Exam reveals no gallop and no friction rub.   No murmur heard. Pulmonary/Chest: Effort normal and breath sounds normal. No respiratory distress. She has no wheezes. She has no rales.  No abnormal breath sounds. No wheezing rales or rhonchi.  Abdominal: Soft. Bowel sounds are normal. She exhibits no distension. There is no tenderness. There is no rebound.  Musculoskeletal: Normal range of motion.  Neurological: She is alert.  Generalized weakness. Needs help to sit independently. No lateralizing weakness. Dr. Starleen Blue with her level of consciousness. Not oriented at baseline.  Skin: Skin is warm and dry. No rash noted.  Psychiatric: She has a normal mood and affect. Her behavior is normal.    ED Course  Procedures (including critical care time) Labs Review Labs Reviewed  CBC WITH DIFFERENTIAL - Abnormal; Notable for the following:    Hemoglobin 10.6 (*)    HCT 31.3 (*)    Neutrophils Relative % 79 (*)    Lymphocytes Relative 9 (*)    All other components within normal limits  COMPREHENSIVE METABOLIC PANEL - Abnormal; Notable for the following:    Sodium 124 (*)    Chloride 83 (*)    Creatinine, Ser 1.19 (*)    GFR calc non Af Amer 43 (*)    GFR calc Af Amer 49 (*)    All other components within normal limits  URINALYSIS, ROUTINE W REFLEX MICROSCOPIC - Abnormal; Notable for the following:    APPearance CLOUDY (*)    All other components within normal limits   Imaging Review Dg Chest Port 1 View  02/13/2013   CLINICAL DATA:  Weakness, hypertension  EXAM: PORTABLE CHEST - 1 VIEW  COMPARISON:  06/09/2005  FINDINGS: The heart size and mediastinal contours are within normal limits. Both lungs are clear. The visualized skeletal structures are unremarkable.  IMPRESSION: No active disease.   Electronically Signed   By: Skipper Cliche M.D.   On: 02/13/2013 09:28    EKG Interpretation    Date/Time:  Saturday February 13 2013 09:25:06 EST Ventricular Rate:  61 PR Interval:  182 QRS Duration: 84 QT Interval:  447 QTC Calculation: 450 R Axis:   7 Text Interpretation:  Sinus rhythm Probable anteroseptal infarct, old Confirmed by Jeneen Rinks  MD, [place] ([id number]) on 02/13/2013 10:30:39 AM            MDM   1.  Hyponatremia   2. Weakness    Is intermittently stable. Afebrile. Not hypoxemic. X-ray normal. Urine is actually somewhat dilute. Renal function is intact. Hyponatremic at 124. This may be the cause of her weakness. I think she would require admission as she is almost had 2 falls and is still weak. I discussed the case with Dr. Ardeth Perfect, of [place].  He will evaluate the patient in the emergency room.    Tanna Furry, MD 02/13/13 [place], MD 02/13/13 1228

## 2013-02-13 NOTE — H&P (Signed)
Physician Admission History and Physical     PCP:   Sheela Stack, MD   Chief Complaint:  Weakness   HPI: Brianna [name] is an 80 y.o. female. Pt presents from home where she lives w/ her husband b/c over hte past couple of days the family has noticed progressive weakness. 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 In the ED, labs and w/u unremarkable except for hyponatremia in setting of hypovolemia likely caused from decreased PO intake in setting of dementia. I have been consulted to admit for treatment of hyponatremia   Review of Systems:  Neg except as noted in HPI   Past Medical History: Past Medical History  Diagnosis Date  . DUB (dysfunctional uterine bleeding)   . Thyroid disease     Hypothyroid  . Hypertension   . Dementia   . Elevated cholesterol   . Kidney stone    Past Surgical History  Procedure Laterality Date  . Cholecystectomy    . Kidney stones    . Colon tumor-benign    . Abdominal hysterectomy      Medications: Prior to Admission medications   Medication Sig Start Date End Date Taking? Authorizing Provider  desvenlafaxine (PRISTIQ) 50 MG 24 hr tablet Take 50 mg by mouth daily.   Yes Historical Provider, MD  esomeprazole (NEXIUM) 40 MG capsule Take 40 mg by mouth 2 (two) times daily.   Yes Historical Provider, MD  galantamine (RAZADYNE ER) 16 MG 24 hr capsule Take 1 capsule (16 mg total) by mouth daily with breakfast. 11/17/12  Yes Dennie Bible, NP  iron polysaccharides (NIFEREX) 150 MG capsule Take 150 mg by mouth daily.   Yes Historical Provider, MD  levothyroxine (SYNTHROID, LEVOTHROID) 88 MCG tablet Take 88 mcg by mouth daily before breakfast.   Yes Historical Provider, MD  nisoldipine (SULAR) 17 MG 24 hr tablet Take 17 mg by mouth daily.   Yes Historical Provider, MD  valsartan (DIOVAN) 320 MG tablet Take 320 mg by mouth daily.   Yes Historical Provider, MD  Vitamin D,  Ergocalciferol, (DRISDOL) 50000 UNITS CAPS capsule Take 50,000 Units by mouth 2 (two) times a week. Tuesday and friday   Yes Historical Provider, MD    Allergies:   Allergies  Allergen Reactions  . Penicillins     unknown    Social History:  reports that she has never smoked. She has never used smokeless tobacco. She reports that she does not drink alcohol or use illicit drugs.  Family History: Family History  Problem Relation Age of Onset  . Hypertension Brother   . Heart disease Brother   . Diabetes type II Brother   . Diabetes type II Sister     Physical Exam: Filed Vitals:   02/13/13 0829  BP: 156/43  Pulse: 59  Temp: 98.6 F (37 C)  TempSrc: Oral  Resp: 16  SpO2: 96%   Gen: WF in NAD and cooperative w/ exam  HEENT: dry MM, anicteric sclera, trachea midline  Lungs: CTAB, no rales, wheezes  Cardio:  RRR, no MRG  Abd:  Soft, NT, ND  Ext/skin: dry, skin tenting present on hand, warm, no edema or cyanosis     Labs on Admission:   Recent Labs  02/13/13 0916  NA 124*  K 3.7  CL 83*  CO2 26  GLUCOSE 86  BUN 14  CREATININE 1.19*  CALCIUM 9.7    Recent Labs  02/13/13  0916  AST 21  ALT 11  ALKPHOS 53  BILITOT 0.5  PROT 7.6  ALBUMIN 4.0   No results found for this basename: LIPASE, AMYLASE,  in the last 72 hours  Recent Labs  02/13/13 0916  WBC 8.1  NEUTROABS 6.4  HGB 10.6*  HCT 31.3*  MCV 79.4  PLT 313   No results found for this basename: CKTOTAL, CKMB, CKMBINDEX, TROPONINI,  in the last 72 hours No results found for this basename: INR,  PROTIME   No results found for this basename: TSH, T4TOTAL, FREET3, T3FREE, THYROIDAB,  in the last 72 hours No results found for this basename: VITAMINB12, FOLATE, FERRITIN, TIBC, IRON, RETICCTPCT,  in the last 72 hours  Radiological Exams on Admission: Dg Chest Port 1 View  02/13/2013   CLINICAL DATA:  Weakness, hypertension  EXAM: PORTABLE CHEST - 1 VIEW  COMPARISON:  06/09/2005  FINDINGS: The  heart size and mediastinal contours are within normal limits. Both lungs are clear. The visualized skeletal structures are unremarkable.  IMPRESSION: No active disease.   Electronically Signed   By: Skipper Cliche M.D.   On: 02/13/2013 09:28   Orders placed during the hospital encounter of 02/13/13  . ED EKG  . ED EKG  . EKG 12-LEAD  . EKG 12-LEAD    Assessment/Plan Principal Problem:   Hyponatremia - likely in setting of dehydration. Hydrate w/ NS at 100cc/hr. Recheck levels in AM. On review of home meds, not on diuretics, so unlikely to be medication induced, so continuing all home medications at this time   # Dementia - followed by [facility] and recently reported as stable. Continuing home meds  # hypertension - home meds   # hypothyroid - home meds. Check TSH/FT4 at this time  #  Weakness - ordering PT/OT. Hydrating as above.   Dispo - admit to inpt for IVF resuscitation. Likely home in 2-3 days, unless PT/OT rec'd otherwise    Brianna [name] 02/13/2013, 11:45 AM

## 2013-02-13 NOTE — ED Notes (Signed)
X-ray at bedside

## 2013-02-13 NOTE — ED Notes (Signed)
Per EMS pt comes from home c/o weakness that family noticed this morning when her husband went to get her up out of the bed. Pt has dementia and per family has chronic abd pain and nausea. Per EMS pt was positive for ortho VS but no increase of HR. Pt denies pain at this time. Pt ahs 22g in left wrist and had approx 453ml of NS in route. 128/47 last BP with EMS.

## 2013-02-14 DIAGNOSIS — F0390 Unspecified dementia without behavioral disturbance: Secondary | ICD-10-CM | POA: Diagnosis not present

## 2013-02-14 DIAGNOSIS — E039 Hypothyroidism, unspecified: Secondary | ICD-10-CM | POA: Diagnosis not present

## 2013-02-14 DIAGNOSIS — M6281 Muscle weakness (generalized): Secondary | ICD-10-CM | POA: Diagnosis not present

## 2013-02-14 DIAGNOSIS — I10 Essential (primary) hypertension: Secondary | ICD-10-CM | POA: Diagnosis not present

## 2013-02-14 DIAGNOSIS — E871 Hypo-osmolality and hyponatremia: Secondary | ICD-10-CM | POA: Diagnosis not present

## 2013-02-14 LAB — COMPREHENSIVE METABOLIC PANEL
ALBUMIN: 3.6 g/dL (ref 3.5–5.2)
ALT: 10 U/L (ref 0–35)
AST: 18 U/L (ref 0–37)
Alkaline Phosphatase: 45 U/L (ref 39–117)
BUN: 9 mg/dL (ref 6–23)
CALCIUM: 8.8 mg/dL (ref 8.4–10.5)
CHLORIDE: 92 meq/L — AB (ref 96–112)
CO2: 24 meq/L (ref 19–32)
Creatinine, Ser: 0.84 mg/dL (ref 0.50–1.10)
GFR calc Af Amer: 75 mL/min — ABNORMAL LOW (ref >90)
GFR, EST NON AFRICAN AMERICAN: 65 mL/min — AB (ref >90)
Glucose, Bld: 74 mg/dL (ref 70–99)
Potassium: 3.4 mEq/L — ABNORMAL LOW (ref 3.7–5.3)
Sodium: 131 mEq/L — ABNORMAL LOW (ref 137–147)
Total Bilirubin: 0.5 mg/dL (ref 0.3–1.2)
Total Protein: 6.7 g/dL (ref 6.0–8.3)

## 2013-02-14 LAB — CBC
HCT: 27.4 % — ABNORMAL LOW (ref 36.0–46.0)
HEMOGLOBIN: 9 g/dL — AB (ref 12.0–15.0)
MCH: 26.5 pg (ref 26.0–34.0)
MCHC: 32.8 g/dL (ref 30.0–36.0)
MCV: 80.6 fL (ref 78.0–100.0)
Platelets: 268 10*3/uL (ref 150–400)
RBC: 3.4 MIL/uL — AB (ref 3.87–5.11)
RDW: 12.4 % (ref 11.5–15.5)
WBC: 5.5 10*3/uL (ref 4.0–10.5)

## 2013-02-14 NOTE — Discharge Summary (Signed)
Physician Discharge Summary    Brianna [name]  MR#: [id number]  DOB:Oct 30, 1932  Date of Admission: 02/13/2013 Date of Discharge: 02/14/2013  Attending Physician:Gianlucas Evenson  Patient's OZH:[id number] [name], MD   Discharge Diagnoses: Principal Problem:   Hyponatremia Active Problems:   Dementia   Weakness   Discharge Medications:   Medication List         desvenlafaxine 50 MG 24 hr tablet  Commonly known as:  PRISTIQ  Take 50 mg by mouth daily.     esomeprazole 40 MG capsule  Commonly known as:  NEXIUM  Take 40 mg by mouth 2 (two) times daily.     galantamine 16 MG 24 hr capsule  Commonly known as:  RAZADYNE ER  Take 1 capsule (16 mg total) by mouth daily with breakfast.     iron polysaccharides 150 MG capsule  Commonly known as:  NIFEREX  Take 150 mg by mouth daily.     levothyroxine 88 MCG tablet  Commonly known as:  SYNTHROID, LEVOTHROID  Take 88 mcg by mouth daily before breakfast.     nisoldipine 17 MG 24 hr tablet  Commonly known as:  SULAR  Take 17 mg by mouth daily.     valsartan 320 MG tablet  Commonly known as:  DIOVAN  Take 320 mg by mouth daily.     Vitamin D (Ergocalciferol) 50000 UNITS Caps capsule  Commonly known as:  DRISDOL  Take 50,000 Units by mouth 2 (two) times a week. Tuesday and [facility] Procedures: Dg Chest Port 1 View  02/13/2013   CLINICAL DATA:  Weakness, hypertension  EXAM: PORTABLE CHEST - 1 VIEW  COMPARISON:  06/09/2005  FINDINGS: The heart size and mediastinal contours are within normal limits. Both lungs are clear. The visualized skeletal structures are unremarkable.  IMPRESSION: No active disease.   Electronically Signed   By: Esperanza Heir M.D.   On: 02/13/2013 09:28    History of Present Illness: Patient presented w/ decreased PO intake and found to be hyponatremic   Hospital Course:  Hyponatremia 2/2 decreased PO intake. NS at 100cc/hr overnight increased NA from 124 to 131, further  supporting this is from dehydration and poor PO intake. Given rapid improvement and patients ability to tolerate PO, I feel she is safe to continue w/ hydration at home. Discussion was had w/ daughter on encouraging PO intake at home given pt can tolerate PO intake at this time. She will be discharged home and have f/u at Dr Lyndle Herrlich office tomorrow for repeat BMet to ensure continued improvement.   Day of Discharge Exam BP 181/78  Pulse 70  Temp(Src) 98.2 F (36.8 C) (Oral)  Resp 18  Ht 5\' 1"  (1.549 m)  SpO2 100%  Physical Exam: Gen: WF in NAD and cooperative w/ exam  HEENT: dry MM, anicteric sclera, trachea midline  Lungs: CTAB, no rales, wheezes  Cardio: RRR, no MRG  Abd: Soft, NT, ND  Ext/skin: dry, skin tenting present on hand, warm, no edema or cyanosis    Discharge Labs:  Recent Labs  02/13/13 0916 02/14/13 0527  NA 124* 131*  K 3.7 3.4*  CL 83* 92*  CO2 26 24  GLUCOSE 86 74  BUN 14 9  CREATININE 1.19* 0.84  CALCIUM 9.7 8.8    Recent Labs  02/13/13 0916 02/14/13 0527  AST 21 18  ALT 11 10  ALKPHOS 53 45  BILITOT 0.5 0.5  PROT 7.6 6.7  ALBUMIN 4.0  3.6    Recent Labs  02/13/13 0916 02/14/13 0527  WBC 8.1 5.5  NEUTROABS 6.4  --   HGB 10.6* 9.0*  HCT 31.3* 27.4*  MCV 79.4 80.6  PLT 313 268   No results found for this basename: INR, PROTIME   No results found for this basename: CKTOTAL, CKMB, CKMBINDEX, TROPONINI,  in the last 72 hours No results found for this basename: TSH, T4TOTAL, FREET3, T3FREE, THYROIDAB,  in the last 72 hours No results found for this basename: VITAMINB12, FOLATE, FERRITIN, TIBC, IRON, RETICCTPCT,  in the last 72 hours  Discharge instructions:     Discharge Orders   Future Appointments Provider Department Dept Phone   05/18/2013 3:00 PM Melvyn Novas, MD [facility] [phone]   Future Orders Complete By Expires   Call MD for:  persistant nausea and vomiting  As directed    Diet - low sodium heart  healthy  As directed    Discharge instructions  As directed    Comments:     Drink 8 cups of water and eat 3 meals per day   Increase activity slowly  As directed         Disposition: home   Follow-up Appts: Follow-up with Dr. Evlyn Kanner at [facility] tomorrow.   Call for appointment.  Condition on Discharge: stable   Tests Needing Follow-up: BMet   Time spent in discharge (includes decision making & examination of pt): 30 minutes    Signed: Yomaris Palecek 02/14/2013, 8:48 AM

## 2013-02-17 ENCOUNTER — Telehealth: Payer: Self-pay | Admitting: Neurology

## 2013-02-17 NOTE — Telephone Encounter (Signed)
NEEDS APPT SOON-PT HAVING BAD HEADACHES-JUST GOT OUT OF HOSPITAL SUNDAY-HOSPITALIZED BECAUSE OF DEHYDRATION.

## 2013-02-18 NOTE — Telephone Encounter (Signed)
Called patient and spoke with patient's daughter about the patient needing a referral for the headaches that she is having, per Butch Penny, Therapist, sports.  I advised the patient's daughter Olegario Shearer to make appt. With patient's PCP and have them send a referral to [facility]. I advised the patient's daughter that if the patient has any other problems, questions or concerns to call the office. Patient's daughter verbalized understanding.

## 2013-03-09 DIAGNOSIS — R197 Diarrhea, unspecified: Secondary | ICD-10-CM | POA: Diagnosis not present

## 2013-03-15 DIAGNOSIS — G4700 Insomnia, unspecified: Secondary | ICD-10-CM | POA: Diagnosis not present

## 2013-03-15 DIAGNOSIS — G309 Alzheimer's disease, unspecified: Secondary | ICD-10-CM | POA: Diagnosis not present

## 2013-03-15 DIAGNOSIS — E039 Hypothyroidism, unspecified: Secondary | ICD-10-CM | POA: Diagnosis not present

## 2013-03-15 DIAGNOSIS — Z6826 Body mass index (BMI) 26.0-26.9, adult: Secondary | ICD-10-CM | POA: Diagnosis not present

## 2013-03-15 DIAGNOSIS — F0280 Dementia in other diseases classified elsewhere without behavioral disturbance: Secondary | ICD-10-CM | POA: Diagnosis not present

## 2013-03-15 DIAGNOSIS — E871 Hypo-osmolality and hyponatremia: Secondary | ICD-10-CM | POA: Diagnosis not present

## 2013-03-15 DIAGNOSIS — I10 Essential (primary) hypertension: Secondary | ICD-10-CM | POA: Diagnosis not present

## 2013-03-15 DIAGNOSIS — D509 Iron deficiency anemia, unspecified: Secondary | ICD-10-CM | POA: Diagnosis not present

## 2013-03-22 DIAGNOSIS — G309 Alzheimer's disease, unspecified: Secondary | ICD-10-CM | POA: Diagnosis not present

## 2013-03-22 DIAGNOSIS — E039 Hypothyroidism, unspecified: Secondary | ICD-10-CM | POA: Diagnosis not present

## 2013-03-22 DIAGNOSIS — F0280 Dementia in other diseases classified elsewhere without behavioral disturbance: Secondary | ICD-10-CM | POA: Diagnosis not present

## 2013-03-22 DIAGNOSIS — I10 Essential (primary) hypertension: Secondary | ICD-10-CM | POA: Diagnosis not present

## 2013-03-22 DIAGNOSIS — Z6825 Body mass index (BMI) 25.0-25.9, adult: Secondary | ICD-10-CM | POA: Diagnosis not present

## 2013-03-22 DIAGNOSIS — R51 Headache: Secondary | ICD-10-CM | POA: Diagnosis not present

## 2013-03-22 DIAGNOSIS — R634 Abnormal weight loss: Secondary | ICD-10-CM | POA: Diagnosis not present

## 2013-03-22 DIAGNOSIS — E559 Vitamin D deficiency, unspecified: Secondary | ICD-10-CM | POA: Diagnosis not present

## 2013-03-22 DIAGNOSIS — E871 Hypo-osmolality and hyponatremia: Secondary | ICD-10-CM | POA: Diagnosis not present

## 2013-03-31 ENCOUNTER — Encounter: Payer: Self-pay | Admitting: Neurology

## 2013-04-20 DIAGNOSIS — E039 Hypothyroidism, unspecified: Secondary | ICD-10-CM | POA: Diagnosis not present

## 2013-04-20 DIAGNOSIS — D509 Iron deficiency anemia, unspecified: Secondary | ICD-10-CM | POA: Diagnosis not present

## 2013-04-20 DIAGNOSIS — M25539 Pain in unspecified wrist: Secondary | ICD-10-CM | POA: Diagnosis not present

## 2013-04-20 DIAGNOSIS — I10 Essential (primary) hypertension: Secondary | ICD-10-CM | POA: Diagnosis not present

## 2013-04-20 DIAGNOSIS — E871 Hypo-osmolality and hyponatremia: Secondary | ICD-10-CM | POA: Diagnosis not present

## 2013-04-20 DIAGNOSIS — F0280 Dementia in other diseases classified elsewhere without behavioral disturbance: Secondary | ICD-10-CM | POA: Diagnosis not present

## 2013-04-20 DIAGNOSIS — E785 Hyperlipidemia, unspecified: Secondary | ICD-10-CM | POA: Diagnosis not present

## 2013-04-20 DIAGNOSIS — F329 Major depressive disorder, single episode, unspecified: Secondary | ICD-10-CM | POA: Diagnosis not present

## 2013-04-20 DIAGNOSIS — G309 Alzheimer's disease, unspecified: Secondary | ICD-10-CM | POA: Diagnosis not present

## 2013-04-20 DIAGNOSIS — R634 Abnormal weight loss: Secondary | ICD-10-CM | POA: Diagnosis not present

## 2013-04-20 DIAGNOSIS — E559 Vitamin D deficiency, unspecified: Secondary | ICD-10-CM | POA: Diagnosis not present

## 2013-04-22 ENCOUNTER — Encounter (INDEPENDENT_AMBULATORY_CARE_PROVIDER_SITE_OTHER): Payer: Self-pay

## 2013-04-22 ENCOUNTER — Ambulatory Visit (INDEPENDENT_AMBULATORY_CARE_PROVIDER_SITE_OTHER): Payer: Medicare Other | Admitting: Neurology

## 2013-04-22 ENCOUNTER — Encounter: Payer: Self-pay | Admitting: Neurology

## 2013-04-22 VITALS — BP 128/64 | HR 65 | Resp 16 | Ht 60.0 in | Wt 129.0 lb

## 2013-04-22 DIAGNOSIS — R519 Headache, unspecified: Secondary | ICD-10-CM

## 2013-04-22 DIAGNOSIS — F0390 Unspecified dementia without behavioral disturbance: Secondary | ICD-10-CM

## 2013-04-22 DIAGNOSIS — T887XXA Unspecified adverse effect of drug or medicament, initial encounter: Secondary | ICD-10-CM

## 2013-04-22 DIAGNOSIS — R51 Headache: Secondary | ICD-10-CM

## 2013-04-22 NOTE — Patient Instructions (Signed)

## 2013-04-22 NOTE — Progress Notes (Signed)
[facility]  PATIENT: Brianna [name] DOB: 04-Dec-1932   REASON FOR VISIT: new concern of severe tension headaches, bifrontal.   HISTORY OF PRESENT ILLNESS:   80 year old caucasian female, who is a long established patient in our practice and carries the diagnosis of dementia, had been seen after acute decompensation and delirium in intial visit from August 2006.   Last visit to our office 08/19/12.  Alzheimer's type dementia . She has been on Excelon and Ambien  and could not tolerate it, changed to Galantamine ER at  16 mg . Last  EEG  2013 was remarkably normal . She has  facial automatisms ," orofacial " movements. Her daughter leads the conversation. Brianna [name] is not longer independent with ADL's.   Her daughter reports a good responce to [place] and feels her mother is less depressed.  GDS =3 today.   She has progressive memory loss. Her insomnia is better since she  recently started on Trazodone with Dr. Forde Dandy. In January 17th . She had to be brought to the hospital after fainting and complaints of headaches.  Her diagnosis was dehydration. She is still not easily motivated to participate in activities. She is very passive, does not exercise.  Had GI upset to [place].  Has not been sleeping well for many years.  Lunesta, Seroquel and ambien, benadryl, all tried and failed.  She denies sleepiness in day time, cyclic sleep and wakefulness changes.  Denies side effects to Galantamine. Memory decline progressed  per daughter. Appetite good.  Sleeps well now.     REVIEW OF SYSTEMS: Full 14 system review of systems performed and notable only for:  Memory loss. Improved sleep.    ALLERGIES: Allergies  Allergen Reactions  . Penicillins     unknown    HOME MEDICATIONS: Outpatient Prescriptions Prior to Visit  Medication Sig Dispense Refill  . galantamine (RAZADYNE ER) 16 MG 24 hr capsule Take 1 capsule (16 mg total) by mouth daily with breakfast.  30  capsule  6  . iron polysaccharides (NU-IRON) 150 MG capsule 150 mg.      . levothyroxine (SYNTHROID, LEVOTHROID) 100 MCG SOLR injection 100 mcg.      . nisoldipine (SULAR) 17 MG 24 hr tablet 17 mg.      . promethazine (PHENERGAN) 25 MG tablet 25 mg.      . traZODone (DESYREL) 50 MG tablet 50 mg.      . valsartan (DIOVAN) 320 MG tablet 320 mg.      . Vitamin D, Ergocalciferol, (DRISDOL) 50000 UNITS CAPS capsule Take 50,000 Units by mouth 2 (two) times a week. Tuesday and friday      . estrogens, conjugated, (PREMARIN) 1.25 MG tablet 1.25 mg.      . Fish Oil-Cholecalciferol (FISH OIL + D3) 1000-1000 MG-UNIT CAPS FISH OIL CAPS       No facility-administered medications prior to visit.    PAST MEDICAL HISTORY: Past Medical History  Diagnosis Date  . DUB (dysfunctional uterine bleeding)   . Thyroid disease     Hypothyroid  . Hypertension   . Dementia   . Elevated cholesterol   . Kidney stone   . Hyperlipidemia   . Depression with anxiety   . Sleep apnea   . Adenomatous colon polyp     PAST SURGICAL HISTORY: Past Surgical History  Procedure Laterality Date  . Cholecystectomy    . Kidney stones    . Colon tumor-benign    . Abdominal hysterectomy    .  Appendectomy      FAMILY HISTORY: Family History  Problem Relation Age of Onset  . Hypertension Brother   . Heart disease Brother   . Diabetes type II Brother   . Diabetes type II Sister   . Heart disease Father   . Diabetes Mother   . Thyroid disease Child   . Diabetes Child     SOCIAL HISTORY: History   Social History  . Marital Status: Married    Spouse Name: [name]    Number of Children: 4  . Years of Education: 8   Occupational History  .     Social History Main Topics  . Smoking status: Never Smoker   . Smokeless tobacco: Never Used  . Alcohol Use: No  . Drug Use: No  . Sexual Activity: No   Other Topics Concern  . Not on file   Social History Narrative   Patient is married [name]).   Patient  has four children.   Patient has an 8th grade education.   Patient is retired.   Patient is right-handed.   Patient drinks 3- 2 liters of Pepsi daily.     PHYSICAL EXAM  Filed Vitals:   04/22/13 1447  BP: 128/64  Pulse: 65  Resp: 16  Height: 5' (1.524 m)  Weight: 129 lb (58.514 kg)   Body mass index is 25.19 kg/(m^2).  Generalized: Well developed, in no acute distress  Head: normocephalic and atraumatic,. Oropharynx benign  Neck: Supple, no carotid bruits  Cardiac: Regular rate rhythm, no murmur    Neurological examination   Mentation: Alert GDS=3. Follows all commands speech is fragmented, yes/ no answers.  She has bifrontal headaches since her syncope , attributed to dehydration.   Cranial nerve II-XII: .Pupils were equal round reactive to light extraocular movements were full, visual field were full on confrontational test.  Facial sensation and strength were normal. hearing was intact to finger rubbing bilaterally. Uvula tongue midline. head turning and shoulder shrug and were normal and symmetric.Tongue protrusion into cheek strength was normal. Motor: normal bulk and tone, full strength in the BUE, BLE, fine finger movements normal, no pronator drift. No focal weakness Coordination: finger-nose-finger, performed smoothly no dysmetria Reflexes: Brachioradialis 2/2, biceps 2/2, triceps 2/2, patellar 2/2, Achilles 2/2, plantar responses were flexor bilaterally. Gait and Station: Rising up from seated position without assistance, normal stance,  moderate stride, good arm swing, smooth turning, able to perform tiptoe, and heel walking without difficulty. Mildly unsteady with tandem   DIAGNOSTIC DATA (LABS, IMAGING, TESTING) -None to review.  MMSE not performed today.  [facility] records accessed.    ASSESSMENT AND PLAN  80 y.o. year old female  has a past medical of  Dementia; here for new concern of headaches.   Currently on Razadyne without side effects.  Trazodone per  Dr. Forde Dandy helped greatly with sleep.  Headaches are tension type.  No photo or phonophobia and no nausea with headaches.  No special time of the day, daughter thinks all day . Every day. Urinates frequently all day.  Drinks diet pepsi 5-6 cans a day  No food triggers, no hypoglycemia. No jaw claudication, no vision changes. No palpable temporal artery.   Caffeine restriction may help tension headaches.  NSAIDS prn allowed. Headache diary may help to further look for triggers.  Continue Razadyne,  will refill Plenty of water for hydration, limit soda intake. CT head , non contrast.  Sed rate , CRP, CMP.  See your eye doctor. tylenol prn  F/U in 2-3 months with Dennie Bible, [facility], [facility], APRN   Larey Seat , MD   [facility] [address] [place], [place] [address] [phone]

## 2013-04-28 ENCOUNTER — Ambulatory Visit
Admission: RE | Admit: 2013-04-28 | Discharge: 2013-04-28 | Disposition: A | Discharge status: Home / Self Care | Payer: Medicare Other | Source: Ambulatory Visit | Attending: Neurology | Admitting: Neurology

## 2013-04-28 ENCOUNTER — Other Ambulatory Visit (INDEPENDENT_AMBULATORY_CARE_PROVIDER_SITE_OTHER): Payer: Self-pay

## 2013-04-28 DIAGNOSIS — R51 Headache: Secondary | ICD-10-CM | POA: Diagnosis not present

## 2013-04-28 DIAGNOSIS — F0390 Unspecified dementia without behavioral disturbance: Secondary | ICD-10-CM

## 2013-04-28 DIAGNOSIS — Z0289 Encounter for other administrative examinations: Secondary | ICD-10-CM

## 2013-04-28 DIAGNOSIS — T887XXA Unspecified adverse effect of drug or medicament, initial encounter: Secondary | ICD-10-CM | POA: Diagnosis not present

## 2013-04-28 DIAGNOSIS — R519 Headache, unspecified: Secondary | ICD-10-CM

## 2013-04-29 LAB — COMPREHENSIVE METABOLIC PANEL
ALBUMIN: 4.4 g/dL (ref 3.5–4.8)
ALT: 5 IU/L (ref 0–32)
AST: 14 IU/L (ref 0–40)
Albumin/Globulin Ratio: 1.8 (ref 1.1–2.5)
Alkaline Phosphatase: 59 IU/L (ref 39–117)
BILIRUBIN TOTAL: 0.3 mg/dL (ref 0.0–1.2)
BUN/Creatinine Ratio: 12 (ref 11–26)
BUN: 11 mg/dL (ref 8–27)
CHLORIDE: 88 mmol/L — AB (ref 97–108)
CO2: 23 mmol/L (ref 18–29)
Calcium: 9.7 mg/dL (ref 8.7–10.3)
Creatinine, Ser: 0.94 mg/dL (ref 0.57–1.00)
GFR calc non Af Amer: 58 mL/min/{1.73_m2} — ABNORMAL LOW (ref >59)
GFR, EST AFRICAN AMERICAN: 67 mL/min/{1.73_m2} (ref >59)
GLUCOSE: 96 mg/dL (ref 65–99)
Globulin, Total: 2.4 g/dL (ref 1.5–4.5)
POTASSIUM: 3.5 mmol/L (ref 3.5–5.2)
Sodium: 131 mmol/L — ABNORMAL LOW (ref 134–144)
Total Protein: 6.8 g/dL (ref 6.0–8.5)

## 2013-04-29 LAB — C-REACTIVE PROTEIN: CRP: 1 mg/L (ref 0.0–4.9)

## 2013-05-03 ENCOUNTER — Telehealth: Payer: Self-pay | Admitting: Neurology

## 2013-05-03 NOTE — Telephone Encounter (Signed)
Patient's daughter Jocelyn Lamer calling to check on whether patient's CT scan results are ready yet. Please call and advise patient.

## 2013-05-03 NOTE — Telephone Encounter (Signed)
Pt's daughter Jocelyn Lamer calling requesting pt's CT scan results. Please advise

## 2013-05-05 NOTE — Telephone Encounter (Signed)
Mesio-temporal atrophy indicative of memory loss disorder ,  As in Alzheimer's. small vessel disease has progressed since 2007, that's expected and partially due to aging.  i called daughter with result.

## 2013-05-05 NOTE — Telephone Encounter (Signed)
Pt's daughter calling again requesting CT scan results. Please advise

## 2013-05-05 NOTE — Telephone Encounter (Signed)
Pt's daughter, Jocelyn Lamer, called. She was calling in to check the status on her previous request from CT results.  She asked if someone could call her with them she would greatly appreciate it.  She is worried and would like to know if anything was found.  Please call her.  Thank you

## 2013-05-18 ENCOUNTER — Ambulatory Visit: Payer: Medicare Other | Admitting: Neurology

## 2013-06-17 ENCOUNTER — Other Ambulatory Visit: Payer: Self-pay

## 2013-06-17 MED ORDER — GALANTAMINE HYDROBROMIDE ER 16 MG PO CP24: 16.0000 mg | ORAL_CAPSULE | Freq: Every day | ORAL | Status: DC

## 2013-08-13 ENCOUNTER — Ambulatory Visit: Payer: Medicare Other | Admitting: Nurse Practitioner

## 2013-08-13 ENCOUNTER — Telehealth: Payer: Self-pay | Admitting: Nurse Practitioner

## 2013-08-13 NOTE — Telephone Encounter (Signed)
No show for scheduled appt 

## 2013-08-20 DIAGNOSIS — E871 Hypo-osmolality and hyponatremia: Secondary | ICD-10-CM | POA: Diagnosis not present

## 2013-08-20 DIAGNOSIS — E039 Hypothyroidism, unspecified: Secondary | ICD-10-CM | POA: Diagnosis not present

## 2013-08-20 DIAGNOSIS — R51 Headache: Secondary | ICD-10-CM | POA: Diagnosis not present

## 2013-08-20 DIAGNOSIS — E559 Vitamin D deficiency, unspecified: Secondary | ICD-10-CM | POA: Diagnosis not present

## 2013-08-20 DIAGNOSIS — E785 Hyperlipidemia, unspecified: Secondary | ICD-10-CM | POA: Diagnosis not present

## 2013-08-20 DIAGNOSIS — D509 Iron deficiency anemia, unspecified: Secondary | ICD-10-CM | POA: Diagnosis not present

## 2013-08-20 DIAGNOSIS — R634 Abnormal weight loss: Secondary | ICD-10-CM | POA: Diagnosis not present

## 2013-08-20 DIAGNOSIS — I10 Essential (primary) hypertension: Secondary | ICD-10-CM | POA: Diagnosis not present

## 2013-08-25 ENCOUNTER — Other Ambulatory Visit (HOSPITAL_COMMUNITY): Payer: Self-pay | Admitting: *Deleted

## 2013-08-25 ENCOUNTER — Encounter (HOSPITAL_COMMUNITY): Payer: Medicare Other

## 2013-08-26 ENCOUNTER — Encounter (HOSPITAL_COMMUNITY)
Admission: RE | Admit: 2013-08-26 | Discharge: 2013-08-26 | Disposition: A | Discharge status: Home / Self Care | Payer: Medicare Other | Source: Ambulatory Visit | Attending: Endocrinology | Admitting: Endocrinology

## 2013-08-26 DIAGNOSIS — D649 Anemia, unspecified: Secondary | ICD-10-CM | POA: Diagnosis not present

## 2013-08-26 MED ORDER — SODIUM CHLORIDE 0.9 % IV SOLN
1020.0000 mg | Freq: Once | INTRAVENOUS | Status: AC
  Administered 2013-08-26: 1020 mg via INTRAVENOUS
  Filled 2013-08-26: qty 34

## 2013-09-06 ENCOUNTER — Emergency Department (HOSPITAL_COMMUNITY): Payer: Medicare Other

## 2013-09-06 ENCOUNTER — Inpatient Hospital Stay (HOSPITAL_COMMUNITY)
Admission: EM | Admit: 2013-09-06 | Discharge: 2013-09-08 | DRG: 918 | Disposition: A | Discharge status: Home / Self Care | Payer: Medicare Other | Attending: Endocrinology | Admitting: Endocrinology

## 2013-09-06 ENCOUNTER — Encounter (HOSPITAL_COMMUNITY): Payer: Self-pay | Admitting: Emergency Medicine

## 2013-09-06 DIAGNOSIS — R4182 Altered mental status, unspecified: Secondary | ICD-10-CM | POA: Diagnosis not present

## 2013-09-06 DIAGNOSIS — E871 Hypo-osmolality and hyponatremia: Secondary | ICD-10-CM | POA: Diagnosis not present

## 2013-09-06 DIAGNOSIS — R195 Other fecal abnormalities: Secondary | ICD-10-CM | POA: Diagnosis present

## 2013-09-06 DIAGNOSIS — G4720 Circadian rhythm sleep disorder, unspecified type: Secondary | ICD-10-CM | POA: Diagnosis present

## 2013-09-06 DIAGNOSIS — F3289 Other specified depressive episodes: Secondary | ICD-10-CM | POA: Diagnosis present

## 2013-09-06 DIAGNOSIS — T383X1A Poisoning by insulin and oral hypoglycemic [antidiabetic] drugs, accidental (unintentional), initial encounter: Principal | ICD-10-CM | POA: Diagnosis present

## 2013-09-06 DIAGNOSIS — I10 Essential (primary) hypertension: Secondary | ICD-10-CM | POA: Diagnosis not present

## 2013-09-06 DIAGNOSIS — R4181 Age-related cognitive decline: Secondary | ICD-10-CM | POA: Diagnosis present

## 2013-09-06 DIAGNOSIS — R55 Syncope and collapse: Secondary | ICD-10-CM | POA: Diagnosis present

## 2013-09-06 DIAGNOSIS — F0390 Unspecified dementia without behavioral disturbance: Secondary | ICD-10-CM | POA: Diagnosis not present

## 2013-09-06 DIAGNOSIS — T38801A Poisoning by unspecified hormones and synthetic substitutes, accidental (unintentional), initial encounter: Secondary | ICD-10-CM | POA: Diagnosis present

## 2013-09-06 DIAGNOSIS — F329 Major depressive disorder, single episode, unspecified: Secondary | ICD-10-CM | POA: Diagnosis present

## 2013-09-06 DIAGNOSIS — C44319 Basal cell carcinoma of skin of other parts of face: Secondary | ICD-10-CM | POA: Diagnosis present

## 2013-09-06 DIAGNOSIS — R0602 Shortness of breath: Secondary | ICD-10-CM | POA: Diagnosis not present

## 2013-09-06 DIAGNOSIS — E039 Hypothyroidism, unspecified: Secondary | ICD-10-CM

## 2013-09-06 DIAGNOSIS — K219 Gastro-esophageal reflux disease without esophagitis: Secondary | ICD-10-CM | POA: Diagnosis present

## 2013-09-06 DIAGNOSIS — R5381 Other malaise: Secondary | ICD-10-CM | POA: Diagnosis not present

## 2013-09-06 DIAGNOSIS — R42 Dizziness and giddiness: Secondary | ICD-10-CM | POA: Diagnosis not present

## 2013-09-06 DIAGNOSIS — Z860100 Personal history of colon polyps, unspecified: Secondary | ICD-10-CM

## 2013-09-06 DIAGNOSIS — T448X1A Poisoning by centrally-acting and adrenergic-neuron-blocking agents, accidental (unintentional), initial encounter: Secondary | ICD-10-CM | POA: Diagnosis present

## 2013-09-06 DIAGNOSIS — T50901A Poisoning by unspecified drugs, medicaments and biological substances, accidental (unintentional), initial encounter: Secondary | ICD-10-CM | POA: Diagnosis not present

## 2013-09-06 DIAGNOSIS — F411 Generalized anxiety disorder: Secondary | ICD-10-CM | POA: Diagnosis present

## 2013-09-06 DIAGNOSIS — R5383 Other fatigue: Secondary | ICD-10-CM | POA: Diagnosis not present

## 2013-09-06 DIAGNOSIS — Y92009 Unspecified place in unspecified non-institutional (private) residence as the place of occurrence of the external cause: Secondary | ICD-10-CM

## 2013-09-06 DIAGNOSIS — D509 Iron deficiency anemia, unspecified: Secondary | ICD-10-CM | POA: Diagnosis present

## 2013-09-06 LAB — COMPREHENSIVE METABOLIC PANEL
ALBUMIN: 3.6 g/dL (ref 3.5–5.2)
ALT: 8 U/L (ref 0–35)
AST: 17 U/L (ref 0–37)
Alkaline Phosphatase: 64 U/L (ref 39–117)
Anion gap: 15 (ref 5–15)
BILIRUBIN TOTAL: 0.3 mg/dL (ref 0.3–1.2)
BUN: 15 mg/dL (ref 6–23)
CHLORIDE: 91 meq/L — AB (ref 96–112)
CO2: 21 meq/L (ref 19–32)
Calcium: 9.4 mg/dL (ref 8.4–10.5)
Creatinine, Ser: 1.05 mg/dL (ref 0.50–1.10)
GFR calc Af Amer: 57 mL/min — ABNORMAL LOW (ref >90)
GFR calc non Af Amer: 49 mL/min — ABNORMAL LOW (ref >90)
Glucose, Bld: 119 mg/dL — ABNORMAL HIGH (ref 70–99)
POTASSIUM: 4 meq/L (ref 3.7–5.3)
Sodium: 127 mEq/L — ABNORMAL LOW (ref 137–147)
Total Protein: 6.9 g/dL (ref 6.0–8.3)

## 2013-09-06 LAB — CBC WITH DIFFERENTIAL/PLATELET
BASOS ABS: 0 10*3/uL (ref 0.0–0.1)
BASOS PCT: 1 % (ref 0–1)
Eosinophils Absolute: 0.1 10*3/uL (ref 0.0–0.7)
Eosinophils Relative: 1 % (ref 0–5)
HEMATOCRIT: 25.7 % — AB (ref 36.0–46.0)
Hemoglobin: 8.1 g/dL — ABNORMAL LOW (ref 12.0–15.0)
LYMPHS PCT: 23 % (ref 12–46)
Lymphs Abs: 1.7 10*3/uL (ref 0.7–4.0)
MCH: 23.6 pg — ABNORMAL LOW (ref 26.0–34.0)
MCHC: 31.5 g/dL (ref 30.0–36.0)
MCV: 74.9 fL — ABNORMAL LOW (ref 78.0–100.0)
Monocytes Absolute: 0.8 10*3/uL (ref 0.1–1.0)
Monocytes Relative: 11 % (ref 3–12)
NEUTROS ABS: 4.8 10*3/uL (ref 1.7–7.7)
NEUTROS PCT: 64 % (ref 43–77)
Platelets: 307 10*3/uL (ref 150–400)
RBC: 3.43 MIL/uL — ABNORMAL LOW (ref 3.87–5.11)
RDW: 18.8 % — ABNORMAL HIGH (ref 11.5–15.5)
WBC: 7.4 10*3/uL (ref 4.0–10.5)

## 2013-09-06 LAB — URINALYSIS, ROUTINE W REFLEX MICROSCOPIC
Bilirubin Urine: NEGATIVE
Glucose, UA: NEGATIVE mg/dL
HGB URINE DIPSTICK: NEGATIVE
Ketones, ur: NEGATIVE mg/dL
Leukocytes, UA: NEGATIVE
NITRITE: NEGATIVE
Protein, ur: NEGATIVE mg/dL
SPECIFIC GRAVITY, URINE: 1.009 (ref 1.005–1.030)
Urobilinogen, UA: 0.2 mg/dL (ref 0.0–1.0)
pH: 5.5 (ref 5.0–8.0)

## 2013-09-06 LAB — CBG MONITORING, ED: GLUCOSE-CAPILLARY: 111 mg/dL — AB (ref 70–99)

## 2013-09-06 LAB — GLUCOSE, CAPILLARY: Glucose-Capillary: 112 mg/dL — ABNORMAL HIGH (ref 70–99)

## 2013-09-06 MED ORDER — ONDANSETRON HCL 4 MG PO TABS: 4.0000 mg | ORAL_TABLET | Freq: Four times a day (QID) | ORAL | Status: DC | PRN

## 2013-09-06 MED ORDER — TRAZODONE 25 MG HALF TABLET
25.0000 mg | ORAL_TABLET | Freq: Every day | ORAL | Status: DC
  Administered 2013-09-06 – 2013-09-07 (×2): 25 mg via ORAL
  Filled 2013-09-06 (×3): qty 1

## 2013-09-06 MED ORDER — SODIUM CHLORIDE 0.9 % IJ SOLN: 3.0000 mL | Freq: Two times a day (BID) | INTRAMUSCULAR | Status: DC

## 2013-09-06 MED ORDER — VENLAFAXINE HCL ER 75 MG PO CP24
75.0000 mg | ORAL_CAPSULE | Freq: Every day | ORAL | Status: DC
  Administered 2013-09-07 – 2013-09-08 (×2): 75 mg via ORAL
  Filled 2013-09-06 (×3): qty 1

## 2013-09-06 MED ORDER — VITAMIN D (ERGOCALCIFEROL) 1.25 MG (50000 UNIT) PO CAPS
50000.0000 [IU] | ORAL_CAPSULE | ORAL | Status: DC
  Administered 2013-09-07: 50000 [IU] via ORAL
  Filled 2013-09-06: qty 1

## 2013-09-06 MED ORDER — DOCUSATE SODIUM 100 MG PO CAPS
100.0000 mg | ORAL_CAPSULE | Freq: Two times a day (BID) | ORAL | Status: DC
  Administered 2013-09-06: 100 mg via ORAL
  Filled 2013-09-06 (×5): qty 1

## 2013-09-06 MED ORDER — SODIUM CHLORIDE 0.9 % IJ SOLN: 3.0000 mL | INTRAMUSCULAR | Status: DC | PRN

## 2013-09-06 MED ORDER — GALANTAMINE HYDROBROMIDE ER 8 MG PO CP24: 16.0000 mg | ORAL_CAPSULE | Freq: Every day | ORAL | Status: DC

## 2013-09-06 MED ORDER — BISACODYL 5 MG PO TBEC: 5.0000 mg | DELAYED_RELEASE_TABLET | Freq: Every day | ORAL | Status: DC | PRN

## 2013-09-06 MED ORDER — SENNOSIDES-DOCUSATE SODIUM 8.6-50 MG PO TABS: 1.0000 | ORAL_TABLET | Freq: Every evening | ORAL | Status: DC | PRN

## 2013-09-06 MED ORDER — SODIUM CHLORIDE 0.9 % IV BOLUS (SEPSIS)
1000.0000 mL | Freq: Once | INTRAVENOUS | Status: AC
  Administered 2013-09-06: 1000 mL via INTRAVENOUS

## 2013-09-06 MED ORDER — ASPIRIN EC 81 MG PO TBEC
81.0000 mg | DELAYED_RELEASE_TABLET | Freq: Every day | ORAL | Status: DC
  Filled 2013-09-06: qty 1

## 2013-09-06 MED ORDER — ONDANSETRON HCL 4 MG/2ML IJ SOLN
4.0000 mg | Freq: Four times a day (QID) | INTRAMUSCULAR | Status: DC | PRN
  Administered 2013-09-07: 4 mg via INTRAVENOUS
  Filled 2013-09-06: qty 2

## 2013-09-06 MED ORDER — LEVOTHYROXINE SODIUM 100 MCG PO TABS
100.0000 ug | ORAL_TABLET | Freq: Every day | ORAL | Status: DC
  Administered 2013-09-07 – 2013-09-08 (×2): 100 ug via ORAL
  Filled 2013-09-06 (×3): qty 1

## 2013-09-06 MED ORDER — SODIUM CHLORIDE 0.9 % IV SOLN: 250.0000 mL | INTRAVENOUS | Status: DC | PRN

## 2013-09-06 MED ORDER — PANTOPRAZOLE SODIUM 40 MG PO TBEC
80.0000 mg | DELAYED_RELEASE_TABLET | Freq: Every day | ORAL | Status: DC
  Administered 2013-09-07: 80 mg via ORAL
  Filled 2013-09-06 (×2): qty 2

## 2013-09-06 MED ORDER — HEPARIN SODIUM (PORCINE) 5000 UNIT/ML IJ SOLN
5000.0000 [IU] | Freq: Three times a day (TID) | INTRAMUSCULAR | Status: DC
  Administered 2013-09-06: 5000 [IU] via SUBCUTANEOUS
  Filled 2013-09-06 (×5): qty 1

## 2013-09-06 MED ORDER — SODIUM CHLORIDE 0.9 % IV SOLN
INTRAVENOUS | Status: DC
  Administered 2013-09-06 – 2013-09-08 (×3): via INTRAVENOUS

## 2013-09-06 MED ORDER — POLYSACCHARIDE IRON COMPLEX 150 MG PO CAPS
150.0000 mg | ORAL_CAPSULE | Freq: Every day | ORAL | Status: DC
  Administered 2013-09-07: 150 mg via ORAL
  Filled 2013-09-06 (×2): qty 1

## 2013-09-06 MED ORDER — GALANTAMINE HYDROBROMIDE 4 MG PO TABS
8.0000 mg | ORAL_TABLET | Freq: Two times a day (BID) | ORAL | Status: DC
  Administered 2013-09-07 – 2013-09-08 (×2): 8 mg via ORAL
  Filled 2013-09-06 (×5): qty 2

## 2013-09-06 NOTE — H&P (Signed)
[facility] History and Physical  Brianna [name] UTM:[id number] DOB: Feb 22, 1932 DOA: 09/06/2013  Referring physician: Debby Freiberg, MD PCP: Sheela Stack, MD   Chief Complaint: Near Syncope  HPI: Brianna [name] is a 80 y.o. female with baseline dementia presents after having accidentally taken her husbands medications. Patient apparently has high blood pressure and her daughter had put out her fathers medications for him to take but the patient took them instead. It consisted of metformin and also a beta blocker. The daughter states that when she realized this she called 911. Patient did not lose consciousness but she did nearly pass out with her head to the table. She did not fall. She had no injury. Patient was able to respond to the daughter. In the ED she was also noted to have a low sodium and also had a low pressure on initial presentation. She is not doing better and is more awake.   Review of Systems:  Patient has baseline dementia. She is not able to provide accurate ROS  Past Medical History  Diagnosis Date  . DUB (dysfunctional uterine bleeding)   . Thyroid disease     Hypothyroid  . Hypertension   . Dementia   . Elevated cholesterol   . Kidney stone   . Hyperlipidemia   . Depression with anxiety   . Sleep apnea   . Adenomatous colon polyp    Past Surgical History  Procedure Laterality Date  . Cholecystectomy    . Kidney stones    . Colon tumor-benign    . Abdominal hysterectomy    . Appendectomy     Social History:  reports that she has never smoked. She has never used smokeless tobacco. She reports that she does not drink alcohol or use illicit drugs.  Allergies  Allergen Reactions  . Penicillins     unknown    Family History  Problem Relation Age of Onset  . Hypertension Brother   . Heart disease Brother   . Diabetes type II Brother   . Diabetes type II Sister   . Heart disease Father   . Diabetes Mother   . Thyroid disease Child   .  Diabetes Child      Prior to Admission medications   Medication Sig Start Date End Date Taking? Authorizing Provider  galantamine (RAZADYNE ER) 16 MG 24 hr capsule Take 1 capsule (16 mg total) by mouth daily with breakfast. 06/17/13  Yes Larey Seat, MD  iron polysaccharides (NU-IRON) 150 MG capsule Take 150 mg by mouth daily.  06/02/12  Yes Historical Provider, MD  levothyroxine (SYNTHROID, LEVOTHROID) 100 MCG tablet Take 100 mcg by mouth daily before breakfast.   Yes Historical Provider, MD  NEXIUM 40 MG capsule Take 40 mg by mouth daily.  03/15/13  Yes Historical Provider, MD  nisoldipine (SULAR) 17 MG 24 hr tablet Take 17 mg by mouth daily.  03/13/09  Yes Historical Provider, MD  PRISTIQ 50 MG 24 hr tablet Take 50 mg by mouth daily.  04/05/13  Yes Historical Provider, MD  traZODone (DESYREL) 50 MG tablet Take 25 mg by mouth at bedtime.  02/16/13  Yes Historical Provider, MD  valsartan-hydrochlorothiazide (DIOVAN-HCT) 80-12.5 MG per tablet Take 1 tablet by mouth daily.   Yes Historical Provider, MD  Vitamin D, Ergocalciferol, (DRISDOL) 50000 UNITS CAPS capsule Take 50,000 Units by mouth every 7 (seven) days. Tuesday   Yes Historical Provider, MD   Physical Exam: Filed Vitals:   09/06/13 1847  BP: 118/47  Pulse: 52  Temp: 97.7 F (36.5 C)  TempSrc: Oral  Resp: 20  SpO2: 100%    Wt Readings from Last 3 Encounters:  08/26/13 58.514 kg (129 lb)  04/22/13 58.514 kg (129 lb)  11/17/12 60.782 kg (134 lb)    General:  Appears calm and comfortable Eyes: PERRL, normal lids, irises & conjunctiva ENT: grossly normal hearing, lips & tongue Neck: no LAD, masses or thyromegaly Cardiovascular: RRR, no m/r/g. No LE edema. Respiratory: CTA bilaterally, no w/r/r. Normal respiratory effort. Abdomen: soft, ntnd Skin: no rash or induration seen on limited exam Musculoskeletal: grossly normal tone BUE/BLE Psychiatric: grossly normal mood Neurologic: grossly non-focal.          Labs on  Admission:  Basic Metabolic Panel:  Recent Labs Lab 09/06/13 1930  NA 127*  K 4.0  CL 91*  CO2 21  GLUCOSE 119*  BUN 15  CREATININE 1.05  CALCIUM 9.4   Liver Function Tests:  Recent Labs Lab 09/06/13 1930  AST 17  ALT 8  ALKPHOS 64  BILITOT 0.3  PROT 6.9  ALBUMIN 3.6   No results found for this basename: LIPASE, AMYLASE,  in the last 168 hours No results found for this basename: AMMONIA,  in the last 168 hours CBC:  Recent Labs Lab 09/06/13 1930  WBC 7.4  NEUTROABS 4.8  HGB 8.1*  HCT 25.7*  MCV 74.9*  PLT 307   Cardiac Enzymes: No results found for this basename: CKTOTAL, CKMB, CKMBINDEX, TROPONINI,  in the last 168 hours  BNP (last 3 results) No results found for this basename: PROBNP,  in the last 8760 hours CBG:  Recent Labs Lab 09/06/13 1923  GLUCAP 111*    Radiological Exams on Admission: Dg Chest 2 View  09/06/2013   CLINICAL DATA:  Shortness of breath.  History of hypertension.  EXAM: CHEST  2 VIEW  COMPARISON:  Chest x-ray 02/13/2013.  FINDINGS: Lung volumes are normal. No consolidative airspace disease. No pleural effusions. No evidence of pulmonary edema. No pneumothorax. No suspicious appearing pulmonary nodule or mass. Mild cardiomegaly. The patient is rotated to the left on today's exam, resulting in distortion of the mediastinal contours and reduced diagnostic sensitivity and specificity for mediastinal pathology.  IMPRESSION: 1. No radiographic evidence of acute cardiopulmonary disease. 2. Mild cardiomegaly.   Electronically Signed   By: Vinnie Langton M.D.   On: 09/06/2013 20:29   Ct Head Wo Contrast  09/06/2013   CLINICAL DATA:  80 year old female with altered mental status and weakness.  EXAM: CT HEAD WITHOUT CONTRAST  TECHNIQUE: Contiguous axial images were obtained from the base of the skull through the vertex without intravenous contrast.  COMPARISON:  04/28/2013 and 06/07/2005 CTs  FINDINGS: Atrophy and chronic small-vessel white  matter ischemic changes again noted.  No acute intracranial abnormalities are identified, including mass lesion or mass effect, hydrocephalus, extra-axial fluid collection, midline shift, hemorrhage, or acute infarction.  The visualized bony calvarium is unremarkable.  IMPRESSION: No evidence of acute intracranial abnormality.  Atrophy and chronic small-vessel white matter ischemic changes.   Electronically Signed   By: Hassan Rowan M.D.   On: 09/06/2013 20:36     Assessment/Plan Principal Problem:   Near syncope Active Problems:   Hypertension   Dementia   Hypothyroid   1. Near Syncope -will admit to telemetry -she is now awake and confused at baseline  2. Hypertension -I am going to hold her medications for now -pressure is coming back to normal slowly  3.  Hypothyroid -will check TSH -Continue with synthroid  4. Dementia -currently at baseline  5. Hyponatremia -started on IVF NS for hydration -repeat labs in am  6. Anemia -will check iron studies -check stool occult blood  Code Status: Full Code (must indicate code status--if unknown or must be presumed, indicate so) DVT Prophylaxis:Heparin Family Communication: Daughter in Room (indicate person spoken with, if applicable, with phone number if by telephone) Disposition Plan: Home (indicate anticipated LOS)  Time spent: 16min  KHAN,SAADAT A [facility] Pager [phone]  **Disclaimer: This note may have been dictated with voice recognition software. Similar sounding words can inadvertently be transcribed and this note may contain transcription errors which may not have been corrected upon publication of note.**

## 2013-09-06 NOTE — ED Provider Notes (Signed)
CSN: [id number]     Arrival date & time 09/06/13  1832 History   First MD Initiated Contact with Patient 09/06/13 1854     Chief Complaint  Patient presents with  . Nausea     (Consider location/radiation/quality/duration/timing/severity/associated sxs/prior Treatment) Patient is a 80 y.o. female presenting with general illness.  Illness Location:  Generalized Quality:  Weakness, near syncope Severity:  Moderate Onset quality:  Sudden Duration: <1 min. Timing:  Constant Progression:  Resolved Chronicity:  Recurrent Context:  Pt took husbands toprol xl, metformin, and isosorbid on top of home medications for HTN this am Relieved by:  Nothing Worsened by:  Nothing Associated symptoms: nausea (chronically)   Associated symptoms: no abdominal pain, no chest pain, no congestion, no cough, no diarrhea, no fever, no rash, no rhinorrhea, no shortness of breath, no sore throat and no vomiting     Past Medical History  Diagnosis Date  . DUB (dysfunctional uterine bleeding)   . Thyroid disease     Hypothyroid  . Hypertension   . Dementia   . Elevated cholesterol   . Kidney stone   . Hyperlipidemia   . Depression with anxiety   . Sleep apnea   . Adenomatous colon polyp    Past Surgical History  Procedure Laterality Date  . Cholecystectomy    . Kidney stones    . Colon tumor-benign    . Abdominal hysterectomy    . Appendectomy     Family History  Problem Relation Age of Onset  . Hypertension Brother   . Heart disease Brother   . Diabetes type II Brother   . Diabetes type II Sister   . Heart disease Father   . Diabetes Mother   . Thyroid disease Child   . Diabetes Child    History  Substance Use Topics  . Smoking status: Never Smoker   . Smokeless tobacco: Never Used  . Alcohol Use: No   OB History   Grav Para Term Preterm Abortions TAB SAB Ect Mult Living   3 3 3       3      Review of Systems  Constitutional: Negative for fever and chills.  HENT: Negative  for congestion, rhinorrhea and sore throat.   Eyes: Negative for photophobia and visual disturbance.  Respiratory: Negative for cough and shortness of breath.   Cardiovascular: Negative for chest pain and leg swelling.  Gastrointestinal: Positive for nausea (chronically). Negative for vomiting, abdominal pain, diarrhea and constipation.  Endocrine: Negative for polyphagia and polyuria.  Genitourinary: Negative for dysuria, flank pain, vaginal bleeding, vaginal discharge and enuresis.  Musculoskeletal: Negative for back pain and gait problem.  Skin: Negative for color change and rash.  Neurological: Negative for dizziness, syncope, light-headedness and numbness.  Hematological: Negative for adenopathy. Does not bruise/bleed easily.  All other systems reviewed and are negative.     Allergies  Penicillins  Home Medications   Prior to Admission medications   Medication Sig Start Date End Date Taking? Authorizing Provider  galantamine (RAZADYNE ER) 16 MG 24 hr capsule Take 1 capsule (16 mg total) by mouth daily with breakfast. 06/17/13  Yes Larey Seat, MD  iron polysaccharides (NU-IRON) 150 MG capsule Take 150 mg by mouth daily.  06/02/12  Yes Historical Provider, MD  levothyroxine (SYNTHROID, LEVOTHROID) 100 MCG tablet Take 100 mcg by mouth daily before breakfast.   Yes Historical Provider, MD  NEXIUM 40 MG capsule Take 40 mg by mouth daily.  03/15/13  Yes Historical Provider, MD  nisoldipine (SULAR) 17 MG 24 hr tablet Take 17 mg by mouth daily.  03/13/09  Yes Historical Provider, MD  PRISTIQ 50 MG 24 hr tablet Take 50 mg by mouth daily.  04/05/13  Yes Historical Provider, MD  traZODone (DESYREL) 50 MG tablet Take 25 mg by mouth at bedtime.  02/16/13  Yes Historical Provider, MD  valsartan-hydrochlorothiazide (DIOVAN-HCT) 80-12.5 MG per tablet Take 1 tablet by mouth daily.   Yes Historical Provider, MD  Vitamin D, Ergocalciferol, (DRISDOL) 50000 UNITS CAPS capsule Take 50,000 Units by mouth  every 7 (seven) days. Tuesday   Yes Historical Provider, MD   BP 135/55  Pulse 60  Temp(Src) 98 F (36.7 C) (Oral)  Resp 18  Ht 5' (1.524 m)  Wt 135 lb 9.6 oz (61.508 kg)  BMI 26.48 kg/m2  SpO2 100% Physical Exam  Vitals reviewed. Constitutional: She is oriented to person, place, and time. She appears well-developed and well-nourished.  HENT:  Head: Normocephalic and atraumatic.  Right Ear: External ear normal.  Left Ear: External ear normal.  Eyes: Conjunctivae and EOM are normal. Pupils are equal, round, and reactive to light.  Neck: Normal range of motion. Neck supple.  Cardiovascular: Normal rate, regular rhythm, normal heart sounds and intact distal pulses.   Pulmonary/Chest: Effort normal and breath sounds normal.  Abdominal: Soft. Bowel sounds are normal. There is no tenderness.  Musculoskeletal: Normal range of motion.  Neurological: She is alert and oriented to person, place, and time. She has normal strength and normal reflexes. No cranial nerve deficit or sensory deficit.  Skin: Skin is warm and dry.    ED Course  Procedures (including critical care time) Labs Review Labs Reviewed  CBC WITH DIFFERENTIAL - Abnormal; Notable for the following:    RBC 3.43 (*)    Hemoglobin 8.1 (*)    HCT 25.7 (*)    MCV 74.9 (*)    MCH 23.6 (*)    RDW 18.8 (*)    All other components within normal limits  COMPREHENSIVE METABOLIC PANEL - Abnormal; Notable for the following:    Sodium 127 (*)    Chloride 91 (*)    Glucose, Bld 119 (*)    GFR calc non Af Amer 49 (*)    GFR calc Af Amer 57 (*)    All other components within normal limits  GLUCOSE, CAPILLARY - Abnormal; Notable for the following:    Glucose-Capillary 112 (*)    All other components within normal limits  CBG MONITORING, ED - Abnormal; Notable for the following:    Glucose-Capillary 111 (*)    All other components within normal limits  URINALYSIS, ROUTINE W REFLEX MICROSCOPIC  TSH  CBC  COMPREHENSIVE  METABOLIC PANEL  OCCULT BLOOD X 1 CARD TO LAB, STOOL    Imaging Review Dg Chest 2 View  09/06/2013   CLINICAL DATA:  Shortness of breath.  History of hypertension.  EXAM: CHEST  2 VIEW  COMPARISON:  Chest x-ray 02/13/2013.  FINDINGS: Lung volumes are normal. No consolidative airspace disease. No pleural effusions. No evidence of pulmonary edema. No pneumothorax. No suspicious appearing pulmonary nodule or mass. Mild cardiomegaly. The patient is rotated to the left on today's exam, resulting in distortion of the mediastinal contours and reduced diagnostic sensitivity and specificity for mediastinal pathology.  IMPRESSION: 1. No radiographic evidence of acute cardiopulmonary disease. 2. Mild cardiomegaly.   Electronically Signed   By: Vinnie Langton M.D.   On: 09/06/2013 20:29   Ct Head Wo Contrast  09/06/2013   CLINICAL DATA:  80 year old female with altered mental status and weakness.  EXAM: CT HEAD WITHOUT CONTRAST  TECHNIQUE: Contiguous axial images were obtained from the base of the skull through the vertex without intravenous contrast.  COMPARISON:  04/28/2013 and 06/07/2005 CTs  FINDINGS: Atrophy and chronic small-vessel white matter ischemic changes again noted.  No acute intracranial abnormalities are identified, including mass lesion or mass effect, hydrocephalus, extra-axial fluid collection, midline shift, hemorrhage, or acute infarction.  The visualized bony calvarium is unremarkable.  IMPRESSION: No evidence of acute intracranial abnormality.  Atrophy and chronic small-vessel white matter ischemic changes.   Electronically Signed   By: Hassan Rowan M.D.   On: 09/06/2013 20:36     EKG Interpretation   Date/Time:  Monday September 06 2013 19:27:53 EDT Ventricular Rate:  54 PR Interval:  195 QRS Duration: 78 QT Interval:  458 QTC Calculation: 434 R Axis:   9 Text Interpretation:  Sinus rhythm Ventricular premature complex Probable  anteroseptal infarct, old No significant change was found  Confirmed by  Debby Freiberg [phone]) on 09/06/2013 11:51:29 PM      MDM   Final diagnoses:  Near syncope  Dementia, without behavioral disturbance  Essential hypertension  Hyponatremia  Hypothyroidism, unspecified hypothyroidism type    80 y.o. female  with pertinent PMH of dementia, HTN, chronic nausea presents with near syncopal episode and generalized weakness shortly prior to arrival.  Patient apparently took her husband's metformin, isosorbide, Toprol earlier in the day and also had taken her home Diovan in the a.m.  On arrival blood pressure mildly hypertensive per mouth, and patient bradycardic.  EKG as above unremarkable, x-ray and CT scan also unrevealing. Consider medication reaction likely and patient admitted for telemetry and monitoring. Additionally patient has increasing anemia, however has no historical elements of blood loss at this time.  Admitted in stable condition    Labs and imaging as above reviewed.   1. Near syncope   2. Dementia, without behavioral disturbance   3. Essential hypertension   4. Hyponatremia   5. Hypothyroidism, unspecified hypothyroidism type         Debby Freiberg, MD 09/06/13 2355

## 2013-09-06 NOTE — ED Notes (Signed)
Per EMS: Pt has chronic nausea. Pt given 8 mg Zofran en route with no relief.

## 2013-09-06 NOTE — ED Notes (Signed)
Pt arrives by EMS with complaints of taking her husband's medications at 1740 this evening-pt has dementia-daughter had laid the meds out for her father and the pt took them before he could.  Pt took Metformin 500 mg, Isosorbid 30 mg, Toprol XL 50 mg.  CBG 107 per EMS.  Pt c/o nausea and was given Zofran 8 mg by EMS.

## 2013-09-07 DIAGNOSIS — G4720 Circadian rhythm sleep disorder, unspecified type: Secondary | ICD-10-CM | POA: Diagnosis present

## 2013-09-07 DIAGNOSIS — F0390 Unspecified dementia without behavioral disturbance: Secondary | ICD-10-CM | POA: Diagnosis not present

## 2013-09-07 DIAGNOSIS — T38801A Poisoning by unspecified hormones and synthetic substitutes, accidental (unintentional), initial encounter: Secondary | ICD-10-CM | POA: Diagnosis present

## 2013-09-07 DIAGNOSIS — E871 Hypo-osmolality and hyponatremia: Secondary | ICD-10-CM | POA: Diagnosis not present

## 2013-09-07 DIAGNOSIS — D509 Iron deficiency anemia, unspecified: Secondary | ICD-10-CM | POA: Diagnosis not present

## 2013-09-07 DIAGNOSIS — R0602 Shortness of breath: Secondary | ICD-10-CM | POA: Diagnosis not present

## 2013-09-07 DIAGNOSIS — C44319 Basal cell carcinoma of skin of other parts of face: Secondary | ICD-10-CM | POA: Diagnosis present

## 2013-09-07 DIAGNOSIS — I10 Essential (primary) hypertension: Secondary | ICD-10-CM | POA: Diagnosis present

## 2013-09-07 DIAGNOSIS — K219 Gastro-esophageal reflux disease without esophagitis: Secondary | ICD-10-CM | POA: Diagnosis present

## 2013-09-07 DIAGNOSIS — T448X1A Poisoning by centrally-acting and adrenergic-neuron-blocking agents, accidental (unintentional), initial encounter: Secondary | ICD-10-CM | POA: Diagnosis present

## 2013-09-07 DIAGNOSIS — R4182 Altered mental status, unspecified: Secondary | ICD-10-CM | POA: Diagnosis not present

## 2013-09-07 DIAGNOSIS — R195 Other fecal abnormalities: Secondary | ICD-10-CM | POA: Diagnosis present

## 2013-09-07 DIAGNOSIS — Y92009 Unspecified place in unspecified non-institutional (private) residence as the place of occurrence of the external cause: Secondary | ICD-10-CM | POA: Diagnosis not present

## 2013-09-07 DIAGNOSIS — Z860100 Personal history of colon polyps, unspecified: Secondary | ICD-10-CM | POA: Diagnosis not present

## 2013-09-07 DIAGNOSIS — F411 Generalized anxiety disorder: Secondary | ICD-10-CM | POA: Diagnosis present

## 2013-09-07 DIAGNOSIS — F3289 Other specified depressive episodes: Secondary | ICD-10-CM | POA: Diagnosis present

## 2013-09-07 DIAGNOSIS — T383X1A Poisoning by insulin and oral hypoglycemic [antidiabetic] drugs, accidental (unintentional), initial encounter: Secondary | ICD-10-CM | POA: Diagnosis not present

## 2013-09-07 DIAGNOSIS — R55 Syncope and collapse: Secondary | ICD-10-CM | POA: Diagnosis not present

## 2013-09-07 DIAGNOSIS — E039 Hypothyroidism, unspecified: Secondary | ICD-10-CM | POA: Diagnosis not present

## 2013-09-07 DIAGNOSIS — R4181 Age-related cognitive decline: Secondary | ICD-10-CM | POA: Diagnosis not present

## 2013-09-07 DIAGNOSIS — F329 Major depressive disorder, single episode, unspecified: Secondary | ICD-10-CM | POA: Diagnosis present

## 2013-09-07 LAB — PREPARE RBC (CROSSMATCH)

## 2013-09-07 LAB — CBC
HEMATOCRIT: 22 % — AB (ref 36.0–46.0)
Hemoglobin: 7.1 g/dL — ABNORMAL LOW (ref 12.0–15.0)
MCH: 24.3 pg — ABNORMAL LOW (ref 26.0–34.0)
MCHC: 32.3 g/dL (ref 30.0–36.0)
MCV: 75.3 fL — ABNORMAL LOW (ref 78.0–100.0)
PLATELETS: 256 10*3/uL (ref 150–400)
RBC: 2.92 MIL/uL — ABNORMAL LOW (ref 3.87–5.11)
RDW: 19.1 % — AB (ref 11.5–15.5)
WBC: 7.5 10*3/uL (ref 4.0–10.5)

## 2013-09-07 LAB — GLUCOSE, CAPILLARY
GLUCOSE-CAPILLARY: 128 mg/dL — AB (ref 70–99)
GLUCOSE-CAPILLARY: 76 mg/dL (ref 70–99)
GLUCOSE-CAPILLARY: 96 mg/dL (ref 70–99)
GLUCOSE-CAPILLARY: 81 mg/dL (ref 70–99)
Glucose-Capillary: 98 mg/dL (ref 70–99)
Glucose-Capillary: 71 mg/dL (ref 70–99)
Glucose-Capillary: 90 mg/dL (ref 70–99)
Glucose-Capillary: 83 mg/dL (ref 70–99)
Glucose-Capillary: 85 mg/dL (ref 70–99)

## 2013-09-07 LAB — COMPREHENSIVE METABOLIC PANEL
ALBUMIN: 3.1 g/dL — AB (ref 3.5–5.2)
ALK PHOS: 51 U/L (ref 39–117)
ALT: 9 U/L (ref 0–35)
ANION GAP: 11 (ref 5–15)
AST: 14 U/L (ref 0–37)
BUN: 12 mg/dL (ref 6–23)
CO2: 23 mEq/L (ref 19–32)
Calcium: 9 mg/dL (ref 8.4–10.5)
Chloride: 94 mEq/L — ABNORMAL LOW (ref 96–112)
Creatinine, Ser: 1.02 mg/dL (ref 0.50–1.10)
GFR calc Af Amer: 59 mL/min — ABNORMAL LOW (ref >90)
GFR calc non Af Amer: 51 mL/min — ABNORMAL LOW (ref >90)
Glucose, Bld: 94 mg/dL (ref 70–99)
POTASSIUM: 4.8 meq/L (ref 3.7–5.3)
Sodium: 128 mEq/L — ABNORMAL LOW (ref 137–147)
Total Bilirubin: 0.3 mg/dL (ref 0.3–1.2)
Total Protein: 6 g/dL (ref 6.0–8.3)

## 2013-09-07 LAB — FERRITIN: Ferritin: 435 ng/mL — ABNORMAL HIGH (ref 10–291)

## 2013-09-07 LAB — IRON AND TIBC
Iron: 106 ug/dL (ref 42–135)
Iron: 507 ug/dL — ABNORMAL HIGH (ref 42–135)
Saturation Ratios: 31 % (ref 20–55)
Saturation Ratios: 73 % — ABNORMAL HIGH (ref 20–55)
TIBC: 346 ug/dL (ref 250–470)
TIBC: 690 ug/dL — ABNORMAL HIGH (ref 250–470)
UIBC: 240 ug/dL (ref 125–400)
UIBC: 183 ug/dL (ref 125–400)

## 2013-09-07 LAB — RETICULOCYTES
RBC.: 3.26 MIL/uL — ABNORMAL LOW (ref 3.87–5.11)
Retic Count, Absolute: 65.2 10*3/uL (ref 19.0–186.0)
Retic Ct Pct: 2 % (ref 0.4–3.1)

## 2013-09-07 LAB — TSH: TSH: 4.94 u[IU]/mL — AB (ref 0.350–4.500)

## 2013-09-07 LAB — ABO/RH: ABO/RH(D): O POS

## 2013-09-07 LAB — VITAMIN B12: Vitamin B-12: 290 pg/mL (ref 211–911)

## 2013-09-07 LAB — OCCULT BLOOD X 1 CARD TO LAB, STOOL: Fecal Occult Bld: POSITIVE — AB

## 2013-09-07 LAB — FOLATE: FOLATE: 13.2 ng/mL

## 2013-09-07 MED ORDER — FERUMOXYTOL INJECTION 510 MG/17 ML
510.0000 mg | Freq: Once | INTRAVENOUS | Status: AC
  Administered 2013-09-07: 510 mg via INTRAVENOUS
  Filled 2013-09-07: qty 17

## 2013-09-07 MED ORDER — ACETAMINOPHEN 325 MG PO TABS
650.0000 mg | ORAL_TABLET | Freq: Once | ORAL | Status: AC
Start: 2013-09-07 — End: 2013-09-07
  Administered 2013-09-07: 650 mg via ORAL
  Filled 2013-09-07: qty 2

## 2013-09-07 MED ORDER — SODIUM CHLORIDE 0.9 % IV SOLN: Freq: Once | INTRAVENOUS | Status: DC

## 2013-09-07 NOTE — Progress Notes (Signed)
UR Completed.  Brianna [name] Jane [phone] 09/07/2013  

## 2013-09-07 NOTE — Progress Notes (Signed)
Notified of patient having unsustained Vtach on the monitor, patient was sleeping at the time. Vitals were stable and EKG performed. Patient was asymptomatic and is now showing NSR on monitor. Tylene Fantasia, NP was notified of the event. No action taken at this time.  Will continue to monitor.

## 2013-09-07 NOTE — Progress Notes (Signed)
MEDICATION RELATED CONSULT NOTE - INITIAL   Pharmacy Consult for IV iron replacement Indication: Anemia  Allergies  Allergen Reactions  . Penicillins     unknown    Patient Measurements: Height: 5' (152.4 cm) Weight: 135 lb 9.6 oz (61.508 kg) IBW/kg (Calculated) : 45.5  Vital Signs: Temp: 98.2 F (36.8 C) (08/11 0432) Temp src: Oral (08/11 0432) BP: 119/41 mmHg (08/11 0432) Pulse Rate: 61 (08/11 0432) Intake/Output from previous day: 08/10 0701 - 08/11 0700 In: 428.8 [I.V.:428.8] Out: -  Intake/Output from this shift: Total I/O In: 240 [P.O.:240] Out: -   Labs:  Recent Labs  09/06/13 1930 09/07/13 0345  WBC 7.4 7.5  HGB 8.1* 7.1*  HCT 25.7* 22.0*  PLT 307 256  CREATININE 1.05 1.02  ALBUMIN 3.6 3.1*  PROT 6.9 6.0  AST 17 14  ALT 8 9  ALKPHOS 64 51  BILITOT 0.3 0.3   Estimated Creatinine Clearance: 36.6 ml/min (by C-G formula based on Cr of 1.02).   Microbiology: No results found for this or any previous visit (from the past 720 hour(s)).  Medical History: Past Medical History  Diagnosis Date  . DUB (dysfunctional uterine bleeding)   . Thyroid disease     Hypothyroid  . Hypertension   . Dementia   . Elevated cholesterol   . Kidney stone   . Hyperlipidemia   . Depression with anxiety   . Sleep apnea   . Adenomatous colon polyp     Medications:  Scheduled:  . aspirin EC  81 mg Oral Daily  . docusate sodium  100 mg Oral BID  . ferumoxytol  510 mg Intravenous Once  . galantamine  8 mg Oral BID WC  . heparin  5,000 Units Subcutaneous 3 times per day  . iron polysaccharides  150 mg Oral Daily  . levothyroxine  100 mcg Oral QAC breakfast  . pantoprazole  80 mg Oral Q1200  . sodium chloride  3 mL Intravenous Q12H  . sodium chloride  3 mL Intravenous Q12H  . traZODone  25 mg Oral QHS  . venlafaxine XR  75 mg Oral Q breakfast  . Vitamin D (Ergocalciferol)  50,000 Units Oral Q7 days   Infusions:  . sodium chloride 75 mL/hr at 09/06/13 2317    PRN: sodium chloride, bisacodyl, ondansetron (ZOFRAN) IV, ondansetron, senna-docusate, sodium chloride  Assessment: 80 y.o. female with baseline dementia presents after having accidentally taken her husbands medications (metformin and a beta blocker). Pharmacy consulted to dose IV iron for anemia.  Goal of Therapy:  Iron replacement  Plan:   Feraheme (ferumoxytol) 510mg  IV x 1 dose today  Monitor closely for hypersensitivity reactions  F/u iron studies  Peggyann Juba, PharmD, BCPS Pager: [phone] 09/07/2013,11:05 AM

## 2013-09-07 NOTE — Care Management Note (Addendum)
    Page 1 of 1   09/08/2013     2:37:45 PM CARE MANAGEMENT NOTE 09/08/2013  Patient:  Brianna [name], Brianna [name]   Account Number:  [id number]  Date Initiated:  09/07/2013  Documentation initiated by:  Dessa Phi  Subjective/Objective Assessment:   80 Y/O F ADMITTED W/SYNCOPE.     Action/Plan:   FROM HOME.   Anticipated DC Date:  09/08/2013   Anticipated DC Plan:  [place]  CM consult      Choice offered to / List presented to:             Status of service:  Completed, signed off Medicare Important Message given?   (If response is "NO", the following Medicare IM given date fields will be blank) Date Medicare IM given:   Medicare IM given by:   Date Additional Medicare IM given:   Additional Medicare IM given by:    Discharge Disposition:  HOME/SELF CARE  Per UR Regulation:  Reviewed for med. necessity/level of care/duration of stay  If discussed at [place] of Stay Meetings, dates discussed:    Comments:  09/08/13 Curlie Macken RN,BSN NCM [phone] D/C HOME NO ORDERS.  09/07/13 Chae Oommen RN,BSN NCM [phone] WOULD RECOMMEND PT CONS.

## 2013-09-07 NOTE — Progress Notes (Signed)
Subjective: I was called today to transfer patient to [facility] service per family request.  History reviewed with patient and daughter.  Reports episode of near syncope and weakness upon standing yesterday.  Possibly took husbands metformin and BP medication though not certain.  Hg has dropped from 8.1-->7.1 since admission.  Has had a recent GI workup at [facility] including EGD and colonoscopy which were reportedly normal.  Had IV iron 2 weeks ago.  Chronically dark stools.  Patient denies abdominal pain or complaint currently.  3 BM recorded since admission.  Objective: Vital signs in last 24 hours: Temp:  [97.7 F (36.5 C)-98.2 F (36.8 C)] 98 F (36.7 C) (08/11 1348) Pulse Rate:  [52-65] 64 (08/11 1348) Resp:  [18-20] 18 (08/11 1348) BP: (110-135)/(41-64) 131/44 mmHg (08/11 1348) SpO2:  [97 %-100 %] 100 % (08/11 1348) Weight:  [61.508 kg (135 lb 9.6 oz)] 61.508 kg (135 lb 9.6 oz) (08/10 2307) Weight change:  Last BM Date: 09/07/13  CBG (last 3)   Recent Labs  09/07/13 1053 09/07/13 1308 09/07/13 1500  GLUCAP 96 81 90    Intake/Output from previous day: 08/10 0701 - 08/11 0700 In: 428.8 [I.V.:428.8] Out: -  Intake/Output this shift: Total I/O In: 1107 [P.O.:240; I.V.:750; IV Piggyback:117] Out: 200 [Urine:200]  General appearance: alert and no distress Eyes: no scleral icterus Throat: oropharynx moist without erythema Resp: clear to auscultation bilaterally Cardio: regular rate and rhythm GI: soft, non-tender; bowel sounds normal; no masses,  no organomegaly Extremities: no clubbing, cyanosis or edema Neurologic: disoriented   Lab Results:  Recent Labs  09/06/13 1930 09/07/13 0345  NA 127* 128*  K 4.0 4.8  CL 91* 94*  CO2 21 23  GLUCOSE 119* 94  BUN 15 12  CREATININE 1.05 1.02  CALCIUM 9.4 9.0    Recent Labs  09/06/13 1930 09/07/13 0345  AST 17 14  ALT 8 9  ALKPHOS 64 51  BILITOT 0.3 0.3  PROT 6.9 6.0  ALBUMIN 3.6 3.1*    Recent Labs  09/06/13 1930  09/07/13 0345  WBC 7.4 7.5  NEUTROABS 4.8  --   HGB 8.1* 7.1*  HCT 25.7* 22.0*  MCV 74.9* 75.3*  PLT 307 256   No results found for this basename: INR, PROTIME   No results found for this basename: CKTOTAL, CKMB, CKMBINDEX, TROPONINI,  in the last 72 hours  Recent Labs  09/06/13 1930  TSH 4.940*    Recent Labs  09/07/13 0850 09/07/13 1130  TIBC 346 690*  IRON 106 507*  RETICCTPCT  --  2.0    Studies/Results: Dg Chest 2 View  09/06/2013   CLINICAL DATA:  Shortness of breath.  History of hypertension.  EXAM: CHEST  2 VIEW  COMPARISON:  Chest x-ray 02/13/2013.  FINDINGS: Lung volumes are normal. No consolidative airspace disease. No pleural effusions. No evidence of pulmonary edema. No pneumothorax. No suspicious appearing pulmonary nodule or mass. Mild cardiomegaly. The patient is rotated to the left on today's exam, resulting in distortion of the mediastinal contours and reduced diagnostic sensitivity and specificity for mediastinal pathology.  IMPRESSION: 1. No radiographic evidence of acute cardiopulmonary disease. 2. Mild cardiomegaly.   Electronically Signed   By: Vinnie Langton M.D.   On: 09/06/2013 20:29   Ct Head Wo Contrast  09/06/2013   CLINICAL DATA:  80 year old female with altered mental status and weakness.  EXAM: CT HEAD WITHOUT CONTRAST  TECHNIQUE: Contiguous axial images were obtained from the base of the skull through the  vertex without intravenous contrast.  COMPARISON:  04/28/2013 and 06/07/2005 CTs  FINDINGS: Atrophy and chronic small-vessel white matter ischemic changes again noted.  No acute intracranial abnormalities are identified, including mass lesion or mass effect, hydrocephalus, extra-axial fluid collection, midline shift, hemorrhage, or acute infarction.  The visualized bony calvarium is unremarkable.  IMPRESSION: No evidence of acute intracranial abnormality.  Atrophy and chronic small-vessel white matter ischemic changes.   Electronically Signed   By:  Hassan Rowan M.D.   On: 09/06/2013 20:36     Medications: Scheduled: . aspirin EC  81 mg Oral Daily  . docusate sodium  100 mg Oral BID  . galantamine  8 mg Oral BID WC  . heparin  5,000 Units Subcutaneous 3 times per day  . iron polysaccharides  150 mg Oral Daily  . levothyroxine  100 mcg Oral QAC breakfast  . pantoprazole  80 mg Oral Q1200  . sodium chloride  3 mL Intravenous Q12H  . sodium chloride  3 mL Intravenous Q12H  . traZODone  25 mg Oral QHS  . venlafaxine XR  75 mg Oral Q breakfast  . Vitamin D (Ergocalciferol)  50,000 Units Oral Q7 days   Continuous: . sodium chloride 75 mL/hr at 09/07/13 1312    Assessment/Plan: Principal Problem: 1. Near syncope- while this may have been related to inadvertant medication administration (husband's BP medications) anemia may also be contributing.  Negative imaging and no neurologic findings to warrant further evaluation.  Check orthostatics.  Active Problems: 2. Microcytic Anemia- profoundly iron deficient recently with heme positive stools and drop in Hg from 8.1-->7.1 since admission.  Received IV iron today.  Will transfuse 1 unit PRBC tonight and recheck post-transfusion CBC.  Consider outpatient Capsule Endoscopy (?at [facility]) to complete GI workup (does not appear that this has been done).  Hold Heparin and use SCDs for DVT prophylaxis. 3. Hypertension- BP stable off home BP medications.   4. Dementia- limits history.  Continue Galamtamine.   5. Hyponatremia- chronic, stable. 6. Hypothyroid- continue synthroid. 7. Disposition- possible discharge tomorrow if Hg stable after transfusion with outpatient workup of ongoing anemia.     LOS: 1 day   Marton Redwood 09/07/2013, 6:14 PM

## 2013-09-08 LAB — CBC
HEMATOCRIT: 27.2 % — AB (ref 36.0–46.0)
HEMOGLOBIN: 8.9 g/dL — AB (ref 12.0–15.0)
MCH: 25.1 pg — AB (ref 26.0–34.0)
MCHC: 32.7 g/dL (ref 30.0–36.0)
MCV: 76.6 fL — ABNORMAL LOW (ref 78.0–100.0)
Platelets: 235 10*3/uL (ref 150–400)
RBC: 3.55 MIL/uL — AB (ref 3.87–5.11)
RDW: 20.7 % — ABNORMAL HIGH (ref 11.5–15.5)
WBC: 5.7 10*3/uL (ref 4.0–10.5)

## 2013-09-08 LAB — TYPE AND SCREEN
ABO/RH(D): O POS
ANTIBODY SCREEN: NEGATIVE
Unit division: 0

## 2013-09-08 LAB — GLUCOSE, CAPILLARY: GLUCOSE-CAPILLARY: 81 mg/dL (ref 70–99)

## 2013-09-08 LAB — BASIC METABOLIC PANEL
Anion gap: 13 (ref 5–15)
BUN: 11 mg/dL (ref 6–23)
CALCIUM: 9.3 mg/dL (ref 8.4–10.5)
CO2: 21 mEq/L (ref 19–32)
Chloride: 96 mEq/L (ref 96–112)
Creatinine, Ser: 1.02 mg/dL (ref 0.50–1.10)
GFR calc Af Amer: 59 mL/min — ABNORMAL LOW (ref >90)
GFR calc non Af Amer: 51 mL/min — ABNORMAL LOW (ref >90)
GLUCOSE: 92 mg/dL (ref 70–99)
Potassium: 4.4 mEq/L (ref 3.7–5.3)
SODIUM: 130 meq/L — AB (ref 137–147)

## 2013-09-08 NOTE — Progress Notes (Signed)
Pt discharged home via family; Pt and family given and explained all discharge instructions, carenotes, and prescriptions; pt and family stated understanding and denied questions/concerns; all f/u appointments in place; IV removed without complicaitons; pt stable at time of discharge  

## 2013-09-08 NOTE — Progress Notes (Addendum)
During blood transfusion BP became elevated, pt aggitated and up and down to the bathroom 3 times. Bladder scanned pt, pt had 428cc in bladder, BP rechecked and BP had come down. Paged Dr. Brigitte Pulse. New orders for one time in and out cath and qshift bladder scan given. No new orders for BP given at this time. New orders given during computer downtime, orders put in computer as soon as it came back up. Will continue to monitor.

## 2013-09-08 NOTE — Discharge Summary (Signed)
DISCHARGE SUMMARY  Brianna [name]  MR#: [id number]  DOB:1932/07/04  Date of Admission: 09/06/2013 Date of Discharge: 09/08/2013  Attending Physician:Ahmad Vanwey ALAN  Patient's WUJ:[id number] Brianna Diener, MD  Consults:  none  Discharge Diagnoses: Principal Problem:   Near syncope Active Problems:   Hypertension   Dementia   Hyponatremia   Hypothyroid, mildly underreplaced Anemia with heme positive stools and transfusion and iron infusion this admission Depression Sleep wake reversal  GERD Probable basal cell carcinoma left cheek  Discharge Medications:   Medication List         galantamine 16 MG 24 hr capsule  Commonly known as:  RAZADYNE ER  Take 1 capsule (16 mg total) by mouth daily with breakfast.     levothyroxine 100 MCG tablet  Commonly known as:  SYNTHROID, LEVOTHROID  Take 100 mcg by mouth daily before breakfast.     NEXIUM 40 MG capsule  Generic drug:  esomeprazole  Take 40 mg by mouth daily.     nisoldipine 17 MG 24 hr tablet  Commonly known as:  SULAR  Take 17 mg by mouth daily.     NU-IRON 150 MG capsule  Generic drug:  iron polysaccharides  Take 150 mg by mouth daily.     PRISTIQ 50 MG 24 hr tablet  Generic drug:  desvenlafaxine  Take 50 mg by mouth daily.     traZODone 50 MG tablet  Commonly known as:  DESYREL  Take 25 mg by mouth at bedtime.     valsartan-hydrochlorothiazide 80-12.5 MG per tablet  Commonly known as:  DIOVAN-HCT  Take 1 tablet by mouth daily.     Vitamin D (Ergocalciferol) 50000 UNITS Caps capsule  Commonly known as:  DRISDOL  Take 50,000 Units by mouth every 7 (seven) days. Tuesday        Hospital Procedures: Dg Chest 2 View  09/06/2013   CLINICAL DATA:  Shortness of breath.  History of hypertension.  EXAM: CHEST  2 VIEW  COMPARISON:  Chest x-ray 02/13/2013.  FINDINGS: Lung volumes are normal. No consolidative airspace disease. No pleural effusions. No evidence of pulmonary edema. No pneumothorax. No suspicious  appearing pulmonary nodule or mass. Mild cardiomegaly. The patient is rotated to the left on today's exam, resulting in distortion of the mediastinal contours and reduced diagnostic sensitivity and specificity for mediastinal pathology.  IMPRESSION: 1. No radiographic evidence of acute cardiopulmonary disease. 2. Mild cardiomegaly.   Electronically Signed   By: Trudie Reed M.D.   On: 09/06/2013 20:29   Ct Head Wo Contrast  09/06/2013   CLINICAL DATA:  80 year old female with altered mental status and weakness.  EXAM: CT HEAD WITHOUT CONTRAST  TECHNIQUE: Contiguous axial images were obtained from the base of the skull through the vertex without intravenous contrast.  COMPARISON:  04/28/2013 and 06/07/2005 CTs  FINDINGS: Atrophy and chronic small-vessel white matter ischemic changes again noted.  No acute intracranial abnormalities are identified, including mass lesion or mass effect, hydrocephalus, extra-axial fluid collection, midline shift, hemorrhage, or acute infarction.  The visualized bony calvarium is unremarkable.  IMPRESSION: No evidence of acute intracranial abnormality.  Atrophy and chronic small-vessel white matter ischemic changes.   Electronically Signed   By: Laveda Abbe M.D.   On: 09/06/2013 20:36    History of Present Illness: Pt was seen and admiited by the hospitalist team "HPI: Brianna [name] is a 80 y.o. female with baseline dementia presents after having accidentally taken her husbands medications. Patient apparently has high blood pressure and her  daughter had put out her fathers medications for him to take but the patient took them instead. It consisted of metformin and also a beta blocker. The daughter states that when she realized this she called 911. Patient did not lose consciousness but she did nearly pass out with her head to the table. She did not fall. She had no injury. Patient was able to respond to the daughter. In the ED she was also noted to have a low sodium and also  had a low pressure on initial presentation. She is not doing better and is more awake."   Hospital Course: The patient has done well while here. She's not had any syncope or presyncope. Her blood pressure medications have been on hold and she has had no more low blood pressures. She slept poorly while herebeen up for 2 days. Now she is soundly sleeping for the last couple of hours. Her by mouth intake has been fair which is similar to at home. She's had no choking episodes. She was anemic and has heme positive stools. She's had a colonoscopy and endoscopy within the past several months at [facility]. She received both an IV iron infusion as well as 1 unit of packed red blood cells. Hemoglobin is now up to 8.9. Reticulocyte count is fair at 65,000. Iron levels are good at the moment. She may need a capsule endoscopy to further workup this problem. The rate  in hemoglobin with just one unit would suggest a possible lab error with the lower value. She's had no respiratory trouble here. Her moderately advanced dementia is at baseline. Her mild hyponatremia is better than her average. Renal function and electrolytes look fine. She's had no cardiac complaints while here. Daughter feels like she can take care of her home still. Her thyroid dose will be increased by half pill once a week.  Day of Discharge Exam BP 158/54  Pulse 55  Temp(Src) 98.7 F (37.1 C) (Oral)  Resp 18  Ht 5' (1.524 m)  Wt 61.508 kg (135 lb 9.6 oz)  BMI 26.48 kg/m2  SpO2 95%  Physical Exam: General appearance: Frail chronically ill-appearing white female lying on her right side. Resp: Clear with no wheezes rales or rhonchi. No accessory muscles are in use. Cardio: Regular with systolic murmur. GI: soft, non-tender; bowel sounds normal; no masses,  no organomegaly Extremities: no clubbing, cyanosis or edema, distal pulses are strong. Skin is warm and dry without any telangiectasia. A basal cell carcinoma is on the left  cheek and this is been discussed on several occasions with the patient and her daughter  ,  Discharge Labs:  Recent Labs  09/07/13 0345 09/08/13 0423  NA 128* 130*  K 4.8 4.4  CL 94* 96  CO2 23 21  GLUCOSE 94 92  BUN 12 11  CREATININE 1.02 1.02  CALCIUM 9.0 9.3    Recent Labs  09/06/13 1930 09/07/13 0345  AST 17 14  ALT 8 9  ALKPHOS 64 51  BILITOT 0.3 0.3  PROT 6.9 6.0  ALBUMIN 3.6 3.1*    Recent Labs  09/06/13 1930 09/07/13 0345 09/08/13 0423  WBC 7.4 7.5 5.7  NEUTROABS 4.8  --   --   HGB 8.1* 7.1* 8.9*  HCT 25.7* 22.0* 27.2*  MCV 74.9* 75.3* 76.6*  PLT 307 256 235      Recent Labs  09/06/13 1930  TSH 4.940*    Recent Labs  09/07/13 0850 09/07/13 1130  VITAMINB12  --  290  FOLATE  --  13.2  FERRITIN  --  435*  TIBC 346 690*  IRON 106 507*  RETICCTPCT  --  2.0   Results for Brianna, [name] (MRN [id number]) as of 09/08/2013 13:02  Ref. Range 09/07/2013 08:50 09/07/2013 11:30  Iron Latest Range: 42-135 ug/dL 952 841 (H)  UIBC Latest Range: 125-400 ug/dL 324 401  TIBC Latest Range: 250-470 ug/dL 027 253 (H)  Saturation Ratios Latest Range: 20-55 % 31 73 (H)  Ferritin Latest Range: 10-291 ng/mL  435 (H)  Folate No range found  13.2  Vitamin B-12 Latest Range: 211-911 pg/mL  290  RBC. Latest Range: 3.87-5.11 MIL/uL  3.26 (L)  Retic Ct Pct Latest Range: 0.4-3.1 %  2.0  Retic Count, Manual Latest Range: 19.0-186.0 K/uL  65.2  CT head:CLINICAL DATA: 80 year old female with altered mental status and  weakness.  EXAM:  CT HEAD WITHOUT CONTRAST  TECHNIQUE:  Contiguous axial images were obtained from the base of the skull  through the vertex without intravenous contrast.  COMPARISON: 04/28/2013 and 06/07/2005 CTs  FINDINGS:  Atrophy and chronic small-vessel white matter ischemic changes again  noted.  No acute intracranial abnormalities are identified, including mass  lesion or mass effect, hydrocephalus, extra-axial fluid collection,  midline  shift, hemorrhage, or acute infarction.  The visualized bony calvarium is unremarkable.  IMPRESSION:  No evidence of acute intracranial abnormality.  Atrophy and chronic small-vessel white matter ischemic changes.   CLINICAL DATA: Shortness of breath. History of hypertension.  EXAM:  CHEST 2 VIEW  COMPARISON: Chest x-ray 02/13/2013.  FINDINGS:  Lung volumes are normal. No consolidative airspace disease. No  pleural effusions. No evidence of pulmonary edema. No pneumothorax.  No suspicious appearing pulmonary nodule or mass. Mild cardiomegaly.  The patient is rotated to the left on today's exam, resulting in  distortion of the mediastinal contours and reduced diagnostic  sensitivity and specificity for mediastinal pathology.  IMPRESSION:  1. No radiographic evidence of acute cardiopulmonary disease.  2. Mild cardiomegaly.  Electronically Signed  By: Trudie Reed M.D.  On: 09/06/2013 20:29  Discharge instructions:  all meds remain the same, however, an extra one half thyroid pill once a week is advised  Disposition: To home with daughter  Follow-up Appts: Follow-up with Dr. Evlyn Kanner at [place] in 1-2 weeks  Call for appointment.  Condition on Discharge: Improved  Tests Needing Follow-up: None  Signed: Jamaurie Bernier ALAN 09/08/2013, 1:05 PM

## 2013-09-08 NOTE — Discharge Instructions (Signed)
All medicines stay the same BUT add an extra 1/2 thyroid pill once a week Encourage eating We will be setting up followup at [facility]

## 2013-09-14 DIAGNOSIS — R195 Other fecal abnormalities: Secondary | ICD-10-CM | POA: Diagnosis not present

## 2013-09-14 DIAGNOSIS — R198 Other specified symptoms and signs involving the digestive system and abdomen: Secondary | ICD-10-CM | POA: Diagnosis not present

## 2013-09-14 DIAGNOSIS — Z8719 Personal history of other diseases of the digestive system: Secondary | ICD-10-CM | POA: Diagnosis not present

## 2013-09-14 DIAGNOSIS — K921 Melena: Secondary | ICD-10-CM | POA: Diagnosis not present

## 2013-09-14 DIAGNOSIS — E079 Disorder of thyroid, unspecified: Secondary | ICD-10-CM | POA: Diagnosis not present

## 2013-09-14 DIAGNOSIS — D509 Iron deficiency anemia, unspecified: Secondary | ICD-10-CM | POA: Diagnosis not present

## 2013-09-14 DIAGNOSIS — Z8659 Personal history of other mental and behavioral disorders: Secondary | ICD-10-CM | POA: Diagnosis not present

## 2013-10-11 DIAGNOSIS — R195 Other fecal abnormalities: Secondary | ICD-10-CM | POA: Diagnosis not present

## 2013-10-11 DIAGNOSIS — D509 Iron deficiency anemia, unspecified: Secondary | ICD-10-CM | POA: Diagnosis not present

## 2013-10-14 DIAGNOSIS — D509 Iron deficiency anemia, unspecified: Secondary | ICD-10-CM | POA: Diagnosis not present

## 2013-10-25 ENCOUNTER — Inpatient Hospital Stay (HOSPITAL_COMMUNITY)
Admission: EM | Admit: 2013-10-25 | Discharge: 2013-10-27 | DRG: 641 | Disposition: A | Discharge status: Home / Self Care | Payer: Medicare Other | Attending: Endocrinology | Admitting: Endocrinology

## 2013-10-25 DIAGNOSIS — K219 Gastro-esophageal reflux disease without esophagitis: Secondary | ICD-10-CM | POA: Diagnosis present

## 2013-10-25 DIAGNOSIS — F0280 Dementia in other diseases classified elsewhere without behavioral disturbance: Secondary | ICD-10-CM | POA: Diagnosis not present

## 2013-10-25 DIAGNOSIS — Z79899 Other long term (current) drug therapy: Secondary | ICD-10-CM

## 2013-10-25 DIAGNOSIS — R5381 Other malaise: Secondary | ICD-10-CM | POA: Diagnosis not present

## 2013-10-25 DIAGNOSIS — Z90710 Acquired absence of both cervix and uterus: Secondary | ICD-10-CM

## 2013-10-25 DIAGNOSIS — R531 Weakness: Secondary | ICD-10-CM

## 2013-10-25 DIAGNOSIS — F3289 Other specified depressive episodes: Secondary | ICD-10-CM | POA: Diagnosis present

## 2013-10-25 DIAGNOSIS — Z9089 Acquired absence of other organs: Secondary | ICD-10-CM

## 2013-10-25 DIAGNOSIS — E871 Hypo-osmolality and hyponatremia: Principal | ICD-10-CM | POA: Diagnosis present

## 2013-10-25 DIAGNOSIS — I10 Essential (primary) hypertension: Secondary | ICD-10-CM | POA: Diagnosis not present

## 2013-10-25 DIAGNOSIS — Z860100 Personal history of colon polyps, unspecified: Secondary | ICD-10-CM

## 2013-10-25 DIAGNOSIS — M6281 Muscle weakness (generalized): Secondary | ICD-10-CM | POA: Diagnosis not present

## 2013-10-25 DIAGNOSIS — E039 Hypothyroidism, unspecified: Secondary | ICD-10-CM | POA: Diagnosis present

## 2013-10-25 DIAGNOSIS — D649 Anemia, unspecified: Secondary | ICD-10-CM | POA: Diagnosis present

## 2013-10-25 DIAGNOSIS — Z87442 Personal history of urinary calculi: Secondary | ICD-10-CM

## 2013-10-25 DIAGNOSIS — R197 Diarrhea, unspecified: Secondary | ICD-10-CM | POA: Diagnosis not present

## 2013-10-25 DIAGNOSIS — E785 Hyperlipidemia, unspecified: Secondary | ICD-10-CM | POA: Diagnosis present

## 2013-10-25 DIAGNOSIS — Z880 Allergy status to penicillin: Secondary | ICD-10-CM

## 2013-10-25 DIAGNOSIS — G4730 Sleep apnea, unspecified: Secondary | ICD-10-CM | POA: Diagnosis present

## 2013-10-25 DIAGNOSIS — F068 Other specified mental disorders due to known physiological condition: Secondary | ICD-10-CM | POA: Diagnosis present

## 2013-10-25 DIAGNOSIS — D509 Iron deficiency anemia, unspecified: Secondary | ICD-10-CM | POA: Diagnosis present

## 2013-10-25 DIAGNOSIS — R627 Adult failure to thrive: Secondary | ICD-10-CM | POA: Diagnosis present

## 2013-10-25 DIAGNOSIS — F0390 Unspecified dementia without behavioral disturbance: Secondary | ICD-10-CM | POA: Diagnosis present

## 2013-10-25 DIAGNOSIS — F329 Major depressive disorder, single episode, unspecified: Secondary | ICD-10-CM | POA: Diagnosis present

## 2013-10-26 ENCOUNTER — Encounter (HOSPITAL_COMMUNITY): Payer: Self-pay | Admitting: Emergency Medicine

## 2013-10-26 DIAGNOSIS — D649 Anemia, unspecified: Secondary | ICD-10-CM | POA: Diagnosis present

## 2013-10-26 DIAGNOSIS — D509 Iron deficiency anemia, unspecified: Secondary | ICD-10-CM | POA: Diagnosis not present

## 2013-10-26 DIAGNOSIS — E785 Hyperlipidemia, unspecified: Secondary | ICD-10-CM | POA: Diagnosis present

## 2013-10-26 DIAGNOSIS — F0390 Unspecified dementia without behavioral disturbance: Secondary | ICD-10-CM | POA: Diagnosis present

## 2013-10-26 DIAGNOSIS — Z860100 Personal history of colon polyps, unspecified: Secondary | ICD-10-CM | POA: Diagnosis not present

## 2013-10-26 DIAGNOSIS — F3289 Other specified depressive episodes: Secondary | ICD-10-CM | POA: Diagnosis present

## 2013-10-26 DIAGNOSIS — F0280 Dementia in other diseases classified elsewhere without behavioral disturbance: Secondary | ICD-10-CM | POA: Diagnosis not present

## 2013-10-26 DIAGNOSIS — R197 Diarrhea, unspecified: Secondary | ICD-10-CM | POA: Diagnosis present

## 2013-10-26 DIAGNOSIS — R5383 Other fatigue: Secondary | ICD-10-CM | POA: Diagnosis present

## 2013-10-26 DIAGNOSIS — M6281 Muscle weakness (generalized): Secondary | ICD-10-CM | POA: Diagnosis not present

## 2013-10-26 DIAGNOSIS — Z880 Allergy status to penicillin: Secondary | ICD-10-CM | POA: Diagnosis not present

## 2013-10-26 DIAGNOSIS — Z90710 Acquired absence of both cervix and uterus: Secondary | ICD-10-CM | POA: Diagnosis not present

## 2013-10-26 DIAGNOSIS — I10 Essential (primary) hypertension: Secondary | ICD-10-CM | POA: Diagnosis present

## 2013-10-26 DIAGNOSIS — Z79899 Other long term (current) drug therapy: Secondary | ICD-10-CM | POA: Diagnosis not present

## 2013-10-26 DIAGNOSIS — E871 Hypo-osmolality and hyponatremia: Secondary | ICD-10-CM | POA: Diagnosis present

## 2013-10-26 DIAGNOSIS — R5381 Other malaise: Secondary | ICD-10-CM | POA: Diagnosis not present

## 2013-10-26 DIAGNOSIS — F329 Major depressive disorder, single episode, unspecified: Secondary | ICD-10-CM | POA: Diagnosis present

## 2013-10-26 DIAGNOSIS — Z87442 Personal history of urinary calculi: Secondary | ICD-10-CM | POA: Diagnosis not present

## 2013-10-26 DIAGNOSIS — E039 Hypothyroidism, unspecified: Secondary | ICD-10-CM | POA: Diagnosis present

## 2013-10-26 DIAGNOSIS — G4730 Sleep apnea, unspecified: Secondary | ICD-10-CM | POA: Diagnosis present

## 2013-10-26 DIAGNOSIS — K219 Gastro-esophageal reflux disease without esophagitis: Secondary | ICD-10-CM | POA: Diagnosis present

## 2013-10-26 DIAGNOSIS — Z9089 Acquired absence of other organs: Secondary | ICD-10-CM | POA: Diagnosis not present

## 2013-10-26 DIAGNOSIS — R627 Adult failure to thrive: Secondary | ICD-10-CM | POA: Diagnosis present

## 2013-10-26 HISTORY — DX: Iron deficiency anemia, unspecified: D50.9

## 2013-10-26 LAB — BASIC METABOLIC PANEL
ANION GAP: 13 (ref 5–15)
Anion gap: 16 — ABNORMAL HIGH (ref 5–15)
Anion gap: 14 (ref 5–15)
BUN: 7 mg/dL (ref 6–23)
BUN: 7 mg/dL (ref 6–23)
BUN: 7 mg/dL (ref 6–23)
CALCIUM: 9.6 mg/dL (ref 8.4–10.5)
CHLORIDE: 85 meq/L — AB (ref 96–112)
CO2: 24 meq/L (ref 19–32)
CO2: 23 mEq/L (ref 19–32)
CO2: 22 mEq/L (ref 19–32)
Calcium: 9.3 mg/dL (ref 8.4–10.5)
Calcium: 9.4 mg/dL (ref 8.4–10.5)
Chloride: 72 mEq/L — ABNORMAL LOW (ref 96–112)
Chloride: 92 mEq/L — ABNORMAL LOW (ref 96–112)
Creatinine, Ser: 0.78 mg/dL (ref 0.50–1.10)
Creatinine, Ser: 0.75 mg/dL (ref 0.50–1.10)
Creatinine, Ser: 0.81 mg/dL (ref 0.50–1.10)
GFR calc Af Amer: 90 mL/min — ABNORMAL LOW (ref >90)
GFR calc Af Amer: 78 mL/min — ABNORMAL LOW (ref >90)
GFR, EST NON AFRICAN AMERICAN: 77 mL/min — AB (ref >90)
GFR, EST NON AFRICAN AMERICAN: 78 mL/min — AB (ref >90)
GFR, EST NON AFRICAN AMERICAN: 67 mL/min — AB (ref >90)
GLUCOSE: 96 mg/dL (ref 70–99)
Glucose, Bld: 87 mg/dL (ref 70–99)
Glucose, Bld: 83 mg/dL (ref 70–99)
POTASSIUM: 3.8 meq/L (ref 3.7–5.3)
POTASSIUM: 3.8 meq/L (ref 3.7–5.3)
Potassium: 3.6 mEq/L — ABNORMAL LOW (ref 3.7–5.3)
SODIUM: 112 meq/L — AB (ref 137–147)
SODIUM: 127 meq/L — AB (ref 137–147)
Sodium: 122 mEq/L — ABNORMAL LOW (ref 137–147)

## 2013-10-26 LAB — URINALYSIS, ROUTINE W REFLEX MICROSCOPIC
Bilirubin Urine: NEGATIVE
GLUCOSE, UA: NEGATIVE mg/dL
KETONES UR: NEGATIVE mg/dL
Leukocytes, UA: NEGATIVE
Nitrite: NEGATIVE
Protein, ur: NEGATIVE mg/dL
Specific Gravity, Urine: 1.004 — ABNORMAL LOW (ref 1.005–1.030)
Urobilinogen, UA: 0.2 mg/dL (ref 0.0–1.0)
pH: 6.5 (ref 5.0–8.0)

## 2013-10-26 LAB — CBC
HEMATOCRIT: 33.3 % — AB (ref 36.0–46.0)
Hemoglobin: 12.1 g/dL (ref 12.0–15.0)
MCH: 27.7 pg (ref 26.0–34.0)
MCHC: 36.3 g/dL — AB (ref 30.0–36.0)
MCV: 76.2 fL — AB (ref 78.0–100.0)
Platelets: 389 10*3/uL (ref 150–400)
RBC: 4.37 MIL/uL (ref 3.87–5.11)
RDW: 18.7 % — ABNORMAL HIGH (ref 11.5–15.5)
WBC: 8.7 10*3/uL (ref 4.0–10.5)

## 2013-10-26 LAB — URINE MICROSCOPIC-ADD ON

## 2013-10-26 MED ORDER — NISOLDIPINE ER 17 MG PO TB24
17.0000 mg | ORAL_TABLET | Freq: Every day | ORAL | Status: DC
  Administered 2013-10-26 – 2013-10-27 (×2): 17 mg via ORAL
  Filled 2013-10-26 (×2): qty 1

## 2013-10-26 MED ORDER — VITAMIN D (ERGOCALCIFEROL) 1.25 MG (50000 UNIT) PO CAPS
50000.0000 [IU] | ORAL_CAPSULE | ORAL | Status: DC
  Administered 2013-10-26: 50000 [IU] via ORAL
  Filled 2013-10-26: qty 1

## 2013-10-26 MED ORDER — SODIUM CHLORIDE 0.9 % IV BOLUS (SEPSIS)
500.0000 mL | Freq: Once | INTRAVENOUS | Status: AC
  Administered 2013-10-26: 500 mL via INTRAVENOUS

## 2013-10-26 MED ORDER — ONDANSETRON HCL 4 MG/2ML IJ SOLN
4.0000 mg | Freq: Once | INTRAMUSCULAR | Status: AC
  Administered 2013-10-26: 4 mg via INTRAVENOUS
  Filled 2013-10-26: qty 2

## 2013-10-26 MED ORDER — IRBESARTAN 150 MG PO TABS
150.0000 mg | ORAL_TABLET | Freq: Every day | ORAL | Status: DC
  Administered 2013-10-26 – 2013-10-27 (×2): 150 mg via ORAL
  Filled 2013-10-26 (×2): qty 1

## 2013-10-26 MED ORDER — PANTOPRAZOLE SODIUM 40 MG PO TBEC
40.0000 mg | DELAYED_RELEASE_TABLET | Freq: Every day | ORAL | Status: DC
  Administered 2013-10-26 – 2013-10-27 (×2): 40 mg via ORAL
  Filled 2013-10-26 (×2): qty 1

## 2013-10-26 MED ORDER — SODIUM CHLORIDE 0.9 % IV SOLN
Freq: Once | INTRAVENOUS | Status: AC
  Administered 2013-10-26: 04:00:00 via INTRAVENOUS

## 2013-10-26 MED ORDER — DESVENLAFAXINE SUCCINATE ER 50 MG PO TB24
50.0000 mg | ORAL_TABLET | Freq: Every day | ORAL | Status: DC
  Administered 2013-10-26 – 2013-10-27 (×2): 50 mg via ORAL
  Filled 2013-10-26 (×2): qty 1

## 2013-10-26 MED ORDER — GALANTAMINE HYDROBROMIDE ER 8 MG PO CP24
16.0000 mg | ORAL_CAPSULE | Freq: Every day | ORAL | Status: DC
  Filled 2013-10-26 (×3): qty 2

## 2013-10-26 MED ORDER — LEVOTHYROXINE SODIUM 100 MCG PO TABS
100.0000 ug | ORAL_TABLET | Freq: Every day | ORAL | Status: DC
  Administered 2013-10-26 – 2013-10-27 (×2): 100 ug via ORAL
  Filled 2013-10-26 (×4): qty 1

## 2013-10-26 MED ORDER — TRAZODONE HCL 50 MG PO TABS
50.0000 mg | ORAL_TABLET | Freq: Every day | ORAL | Status: DC
  Administered 2013-10-26: 50 mg via ORAL
  Filled 2013-10-26 (×2): qty 1

## 2013-10-26 MED ORDER — ENOXAPARIN SODIUM 40 MG/0.4ML ~~LOC~~ SOLN
40.0000 mg | SUBCUTANEOUS | Status: DC
  Administered 2013-10-26: 40 mg via SUBCUTANEOUS
  Filled 2013-10-26 (×2): qty 0.4

## 2013-10-26 MED ORDER — SODIUM CHLORIDE 0.9 % IV BOLUS (SEPSIS): 1000.0000 mL | Freq: Once | INTRAVENOUS | Status: DC

## 2013-10-26 MED ORDER — POLYSACCHARIDE IRON COMPLEX 150 MG PO CAPS
150.0000 mg | ORAL_CAPSULE | Freq: Every day | ORAL | Status: DC
  Administered 2013-10-26 – 2013-10-27 (×2): 150 mg via ORAL
  Filled 2013-10-26 (×3): qty 1

## 2013-10-26 MED ORDER — SODIUM CHLORIDE 0.9 % IV SOLN: INTRAVENOUS | Status: AC

## 2013-10-26 NOTE — ED Provider Notes (Signed)
CSN: [id number]     Arrival date & time 10/25/13  2354 History   First MD Initiated Contact with Patient 10/26/13 0123     Chief Complaint  Patient presents with  . Diarrhea     Level V caveat: Dementia  HPI Patient is brought to the emergency department by family today for concerns of possible dehydration.  She's had diarrhea through most of the day in a report that she developed generalized weakness and weakness and lightheadedness when she stood up this evening.  No syncope.  No chest pain.  No reports of blood in her stool.  They deny vomiting.  They report normal oral intake.  Her oral intake mainly consists of Pepsi-Cola.    Past Medical History  Diagnosis Date  . DUB (dysfunctional uterine bleeding)   . Thyroid disease     Hypothyroid  . Hypertension   . Dementia   . Elevated cholesterol   . Kidney stone   . Hyperlipidemia   . Depression with anxiety   . Sleep apnea   . Adenomatous colon polyp    Past Surgical History  Procedure Laterality Date  . Cholecystectomy    . Kidney stones    . Colon tumor-benign    . Abdominal hysterectomy    . Appendectomy     Family History  Problem Relation Age of Onset  . Hypertension Brother   . Heart disease Brother   . Diabetes type II Brother   . Diabetes type II Sister   . Heart disease Father   . Diabetes Mother   . Thyroid disease Child   . Diabetes Child    History  Substance Use Topics  . Smoking status: Never Smoker   . Smokeless tobacco: Never Used  . Alcohol Use: No   OB History   Grav Para Term Preterm Abortions TAB SAB Ect Mult Living   3 3 3       3      Review of Systems  Unable to perform ROS     Allergies  Penicillins  Home Medications   Prior to Admission medications   Medication Sig Start Date End Date Taking? Authorizing Provider  galantamine (RAZADYNE ER) 16 MG 24 hr capsule Take 1 capsule (16 mg total) by mouth daily with breakfast. 06/17/13  Yes Larey Seat, MD  iron polysaccharides  (NU-IRON) 150 MG capsule Take 150 mg by mouth daily.  06/02/12  Yes Historical Provider, MD  levothyroxine (SYNTHROID, LEVOTHROID) 100 MCG tablet Take 100 mcg by mouth daily before breakfast.   Yes Historical Provider, MD  NEXIUM 40 MG capsule Take 40 mg by mouth daily.  03/15/13  Yes Historical Provider, MD  nisoldipine (SULAR) 17 MG 24 hr tablet Take 17 mg by mouth daily.  03/13/09  Yes Historical Provider, MD  PRISTIQ 50 MG 24 hr tablet Take 50 mg by mouth daily.  04/05/13  Yes Historical Provider, MD  traZODone (DESYREL) 50 MG tablet Take 50 mg by mouth at bedtime.  02/16/13  Yes Historical Provider, MD  valsartan (DIOVAN) 320 MG tablet Take 320 mg by mouth daily.   Yes Historical Provider, MD  Vitamin D, Ergocalciferol, (DRISDOL) 50000 UNITS CAPS capsule Take 50,000 Units by mouth every 7 (seven) days. Tuesday   Yes Historical Provider, MD   BP 166/69  Pulse 78  Temp(Src) 99 F (37.2 C) (Oral)  Resp 20  SpO2 100% Physical Exam  Nursing note and vitals reviewed. Constitutional: She appears well-developed and well-nourished. No distress.  HENT:  Head: Normocephalic and atraumatic.  Eyes: EOM are normal.  Neck: Normal range of motion.  Cardiovascular: Normal rate, regular rhythm and normal heart sounds.   Pulmonary/Chest: Effort normal and breath sounds normal.  Abdominal: Soft. She exhibits no distension. There is no tenderness.  Musculoskeletal: Normal range of motion.  Neurological: She is alert.  Skin: Skin is warm and dry.  Psychiatric: She has a normal mood and affect. Judgment normal.    ED Course  Procedures (including critical care time) Labs Review Labs Reviewed  CBC - Abnormal; Notable for the following:    HCT 33.3 (*)    MCV 76.2 (*)    MCHC 36.3 (*)    RDW 18.7 (*)    All other components within normal limits  BASIC METABOLIC PANEL - Abnormal; Notable for the following:    Sodium 112 (*)    Potassium 3.6 (*)    Chloride 72 (*)    GFR calc non Af Amer 77 (*)     GFR calc Af Amer 90 (*)    Anion gap 16 (*)    All other components within normal limits  URINALYSIS, ROUTINE W REFLEX MICROSCOPIC - Abnormal; Notable for the following:    APPearance CLOUDY (*)    Specific Gravity, Urine 1.004 (*)    Hgb urine dipstick TRACE (*)    All other components within normal limits  URINE MICROSCOPIC-ADD ON    Imaging Review No results found.   EKG Interpretation None      MDM   Final diagnoses:  None   Patient be admitted the hospital for what appears to be severe hyponatremia with a sodium of 112.  Her chloride is 72.  Potassium is normal.  Facial be hydrated with normal saline and admitted the hospital.  No seizure activity.      Hoy Morn, MD 10/26/13 [phone]

## 2013-10-26 NOTE — ED Notes (Signed)
Patient is more alert after 500 cc bolus.

## 2013-10-26 NOTE — ED Notes (Signed)
Patient is alert with dementia.  She does not show any signs of distress  Upon assessment.  Abdomen nontender with bowel sounds x4 quadrants. Respirations clear and equal with good heart sounds.

## 2013-10-26 NOTE — ED Notes (Signed)
Pt's daughter reports possible dehydration, has been having diarrhea all day today.  Would not drink anything but pepsi.  Daughter reports pt was just at [facility] to have "capsule scopy" which was WNL.

## 2013-10-26 NOTE — H&P (Signed)
PCP:   Sheela Stack, MD   Chief Complaint:  Weakness  HPI: This is a 80 year old white female with progressive dementia as well as hypertension hyperlipidemia and problems with anemia. She is in her usual state of fair health. She is cared for at home by her daughter and her husband. She has some day night reversal 2 days ago and stayed up most of the night. Over the day yesterday she had a few episodes of looser than normal stools and began primarily liquids for most of the day. She did have some chicken part of the day as well. Over the evening she began to get weaker and was brought to return for evaluation. She was found to have a worse than normal hyponatremia. She had no seizures or change in her mental status from her baseline. She was treated with normal saline has already corrected partially. At the moment she is sleeping and has no concerns. Given her advanced dementia she can't tell is much anyway. Her daughter provides the information. She's had no recent exposure to antibiotics. She's had no vomiting. Her breathing is stable. She's been taking her medications as prescribed. No else is been sick around her. Her memory continues to worsen  Review of Systems:  Review of Systems -  Past Medical History: Past Medical History  Diagnosis Date  . DUB (dysfunctional uterine bleeding)   . Thyroid disease     Hypothyroid  . Hypertension   . Dementia   . Elevated cholesterol   . Kidney stone   . Hyperlipidemia   . Depression with anxiety   . Sleep apnea   . Adenomatous colon polyp    Past Surgical History  Procedure Laterality Date  . Cholecystectomy    . Kidney stones    . Colon tumor-benign    . Abdominal hysterectomy    . Appendectomy      Medications: Prior to Admission medications   Medication Sig Start Date End Date Taking? Authorizing Provider  galantamine (RAZADYNE ER) 16 MG 24 hr capsule Take 1 capsule (16 mg total) by mouth daily with breakfast. 06/17/13  Yes  Larey Seat, MD  iron polysaccharides (NU-IRON) 150 MG capsule Take 150 mg by mouth daily.  06/02/12  Yes Historical Provider, MD  levothyroxine (SYNTHROID, LEVOTHROID) 100 MCG tablet Take 100 mcg by mouth daily before breakfast.   Yes Historical Provider, MD  NEXIUM 40 MG capsule Take 40 mg by mouth daily.  03/15/13  Yes Historical Provider, MD  nisoldipine (SULAR) 17 MG 24 hr tablet Take 17 mg by mouth daily.  03/13/09  Yes Historical Provider, MD  PRISTIQ 50 MG 24 hr tablet Take 50 mg by mouth daily.  04/05/13  Yes Historical Provider, MD  traZODone (DESYREL) 50 MG tablet Take 50 mg by mouth at bedtime.  02/16/13  Yes Historical Provider, MD  valsartan (DIOVAN) 320 MG tablet Take 320 mg by mouth daily.   Yes Historical Provider, MD  Vitamin D, Ergocalciferol, (DRISDOL) 50000 UNITS CAPS capsule Take 50,000 Units by mouth every 7 (seven) days. Tuesday   Yes Historical Provider, MD    Allergies:   Allergies  Allergen Reactions  . Penicillins     unknown    Social History:  reports that she has never smoked. She has never used smokeless tobacco. She reports that she does not drink alcohol or use illicit drugs.  Family History: Family History  Problem Relation Age of Onset  . Hypertension Brother   . Heart disease Brother   .  Diabetes type II Brother   . Diabetes type II Sister   . Heart disease Father   . Diabetes Mother   . Thyroid disease Child   . Diabetes Child     Physical Exam: Filed Vitals:   10/26/13 0005 10/26/13 0153 10/26/13 0258 10/26/13 0416  BP: 166/69 154/69 130/62 155/55  Pulse: 78 66 66 65  Temp: 99 F (37.2 C) 99.1 F (37.3 C)  97.7 F (36.5 C)  TempSrc: Oral Oral  Oral  Resp: 20 18 18 18   Height:    5\' 4"  (1.626 m)  Weight:    59.376 kg (130 lb 14.4 oz)  SpO2: 100% 95% 95% 100%   General appearance: Frail white female lying on her right side breathing comfortably in no distress  Oral mucous membranes are clear and moist Resp: Clear with no wheezes  rales or rhonchi, no accessory muscles are in use. Cardio: Regular and a bit fast but distant GI: soft, non-tender; nondistended bowel sounds slightly reduced; no masses,  no organomegaly Extremities: extremities normal, atraumatic, no cyanosis or edema Pulses: 2+ and symmetric  Lesion of left cheek that looks to be a basal cell carcinoma Neuro: Prior to her going to sleep she was confused at baseline    Labs on Admission:   Recent Labs  10/26/13 0147 10/26/13 0545  NA 112* 122*  K 3.6* 3.8  CL 72* 85*  CO2 24 23  GLUCOSE 96 87  BUN 7 7  CREATININE 0.78 0.75  CALCIUM 9.6 9.3      Recent Labs  10/26/13 0147  WBC 8.7  HGB 12.1  HCT 33.3*  MCV 76.2*  PLT 389        Radiological Exams on Admission: No results found.   Assessment/Plan Principal Problem:   Hyponatremia: She's artery responded to some fluid resuscitation. She chronically runs about 130 on her baseline. There is no end dictation for 3% normal saline or a Vaptan. Fluids will be continued at a repeat will be done at 2 PM Active Problems:   Hypertension: Continue medications   Dementia, moderately advanced: Continue medications   Weakness: This is worse than her baseline   Hypothyroid: Eye medications with recent TSH that was fine   Anemia: She's had extensive evaluation. This is the best hemoglobin I have seen in over a year   Diarrhea: This is been minor and self-limited. I doubt C. difficile is present.   Arley Garant ALAN 10/26/2013, 8:50 AM

## 2013-10-27 LAB — BASIC METABOLIC PANEL
ANION GAP: 12 (ref 5–15)
BUN: 10 mg/dL (ref 6–23)
CHLORIDE: 91 meq/L — AB (ref 96–112)
CO2: 23 mEq/L (ref 19–32)
Calcium: 8.6 mg/dL (ref 8.4–10.5)
Creatinine, Ser: 1.04 mg/dL (ref 0.50–1.10)
GFR, EST AFRICAN AMERICAN: 58 mL/min — AB (ref >90)
GFR, EST NON AFRICAN AMERICAN: 50 mL/min — AB (ref >90)
Glucose, Bld: 88 mg/dL (ref 70–99)
Potassium: 3.5 mEq/L — ABNORMAL LOW (ref 3.7–5.3)
SODIUM: 126 meq/L — AB (ref 137–147)

## 2013-10-27 MED ORDER — GALANTAMINE HYDROBROMIDE 4 MG PO TABS
8.0000 mg | ORAL_TABLET | Freq: Two times a day (BID) | ORAL | Status: DC
  Administered 2013-10-27: 8 mg via ORAL
  Filled 2013-10-27 (×4): qty 2

## 2013-10-27 MED ORDER — GALANTAMINE HYDROBROMIDE 4 MG PO TABS
12.0000 mg | ORAL_TABLET | Freq: Two times a day (BID) | ORAL | Status: DC
  Filled 2013-10-27 (×3): qty 3

## 2013-10-27 NOTE — Discharge Summary (Signed)
DISCHARGE SUMMARY  Brianna [name]  MR#: [id number]  DOB:[birth year]  Date of Admission: 10/25/2013 Date of Discharge: 10/27/2013  Attending Physician:Brianna [name]  Patient's UYQ:[id number] Brianna [name]  Consults: none  Discharge Diagnoses: Principal Problem:   Hyponatremia, close to her baseline, improved Active Problems:   Hypertension, stable   Dementia, moderately advanced on medications Depression: Doing well   Weakness, chronic   Hypothyroid, stable   Anemia, improved   Diarrhea, resolved GERD, on medications   Discharge Medications:   Medication List         galantamine 16 MG 24 hr capsule  Commonly known as:  RAZADYNE ER  Take 1 capsule (16 mg total) by mouth daily with breakfast.     levothyroxine 100 MCG tablet  Commonly known as:  SYNTHROID, LEVOTHROID  Take 100 mcg by mouth daily before breakfast.     NEXIUM 40 MG capsule  Generic drug:  esomeprazole  Take 40 mg by mouth daily.     nisoldipine 17 MG 24 hr tablet  Commonly known as:  SULAR  Take 17 mg by mouth daily.     NU-IRON 150 MG capsule  Generic drug:  iron polysaccharides  Take 150 mg by mouth daily.     PRISTIQ 50 MG 24 hr tablet  Generic drug:  desvenlafaxine  Take 50 mg by mouth daily.     traZODone 50 MG tablet  Commonly known as:  DESYREL  Take 50 mg by mouth at bedtime.     valsartan 320 MG tablet  Commonly known as:  DIOVAN  Take 320 mg by mouth daily.     Vitamin D (Ergocalciferol) 50000 UNITS Caps capsule  Commonly known as:  DRISDOL  Take 50,000 Units by mouth every 7 (seven) days. Tuesday        Hospital Procedures: No results found.  History of Present Illness: Confusion And weakness  Hospital Course: Is a 80 year old white female with progressive dementia as well as chronic mild hyponatremia, hypertension, and hypothyroidism. She presented after a couple days of diminished mental status and sleep-wake cycle reversal. She spent have significant  hyponatremia and was brought to the hospital and treat this. Fortunately this is corrected} line fairly quickly. She runs in the 128 to 130 range and a chronic basis. She did well without seizures or other adverse effects of the low sodium. She's now back to her mental status baseline and is now eating again. She is up going to the bathroom a little bit of help this morning. She did have a loose stool or 2 but this is now resolved. She's had no fevers chills or sweats. She's had no pulmonary issues while here. No cardiac issues while here. She is in the later stages of dementia and is been a failure to thrive situation for the last year or 2. Fortunately her significant anemia has resolved.  Day of Discharge Exam BP 116/40  Pulse 59  Temp(Src) 98.3 F (36.8 C) (Oral)  Resp 18  Ht 5\' 4"  (1.626 m)  Wt 59.376 kg (130 lb 14.4 oz)  BMI 22.46 kg/m2  SpO2 98%  Physical Exam: General appearance: A bit frail but back to her baseline. Eyes: no scleral icterus, no nystagmus Throat: oropharynx moist without erythema Resp: Clear with no wheezes, rales, or rhonchi. No accessory muscles in use. Cardio: Regular with systolic murmur GI: soft, non-tender; bowel sounds slightly reduced; no masses,  no organomegaly Extremities: no clubbing, cyanosis or edema, pulses fairly intact Neuro: Makes good eye contact ,  can stand with help, no tremor, not verbal with me today  Discharge Labs:  Recent Labs  10/26/13 1350 10/27/13 0433  NA 127* 126*  K 3.8 3.5*  CL 92* 91*  CO2 22 23  GLUCOSE 83 88  BUN 7 10  CREATININE 0.81 1.04  CALCIUM 9.4 8.6     Recent Labs  10/26/13 0147  WBC 8.7  HGB 12.1  HCT 33.3*  MCV 76.2*  PLT 389   Results for Brianna [name], Brianna [name] (MRN [id number]) as of 10/27/2013 08:23  Ref. Range 10/26/2013 01:51  Color, Urine Latest Range: YELLOW  YELLOW  APPearance Latest Range: CLEAR  CLOUDY (A)  Specific Gravity, Urine Latest Range: 1.005-1.030  1.004 (L)  pH Latest Range: 5.0-8.0   6.5  Glucose Latest Range: NEGATIVE mg/dL NEGATIVE  Bilirubin Urine Latest Range: NEGATIVE  NEGATIVE  Ketones, ur Latest Range: NEGATIVE mg/dL NEGATIVE  Protein Latest Range: NEGATIVE mg/dL NEGATIVE  Urobilinogen, UA Latest Range: 0.0-1.0 mg/dL 0.2  Nitrite Latest Range: NEGATIVE  NEGATIVE  Leukocytes, UA Latest Range: NEGATIVE  NEGATIVE  Hgb urine dipstick Latest Range: NEGATIVE  TRACE (A)  RBC / HPF Latest Range: <3 RBC/hpf 0-2  Squamous Epithelial / LPF Latest Range: RARE  RARE       Discharge instructions: Resume all medications as before    Disposition:  to home   Follow-up Appts: Follow-up with Dr. Evlyn [name] at [facility] in 1-2 weeks   Call for appointment.  Condition on Discharge:  improved   Tests Needing Follow-up:  none   Signed: Alekzander Cardell [name] 10/27/2013, 8:18 AM

## 2013-10-27 NOTE — Discharge Instructions (Signed)
Resume all meds as before.  Watch for fever or worsening

## 2013-11-09 DIAGNOSIS — Z23 Encounter for immunization: Secondary | ICD-10-CM | POA: Diagnosis not present

## 2013-11-18 ENCOUNTER — Emergency Department (HOSPITAL_COMMUNITY): Payer: Medicare Other

## 2013-11-18 ENCOUNTER — Inpatient Hospital Stay (HOSPITAL_COMMUNITY)
Admission: EM | Admit: 2013-11-18 | Discharge: 2013-11-21 | DRG: 645 | Disposition: A | Discharge status: Home / Self Care | Payer: Medicare Other | Attending: Endocrinology | Admitting: Endocrinology

## 2013-11-18 ENCOUNTER — Encounter (HOSPITAL_COMMUNITY): Payer: Self-pay | Admitting: Emergency Medicine

## 2013-11-18 DIAGNOSIS — E785 Hyperlipidemia, unspecified: Secondary | ICD-10-CM | POA: Diagnosis present

## 2013-11-18 DIAGNOSIS — R631 Polydipsia: Secondary | ICD-10-CM | POA: Diagnosis present

## 2013-11-18 DIAGNOSIS — E039 Hypothyroidism, unspecified: Secondary | ICD-10-CM | POA: Diagnosis present

## 2013-11-18 DIAGNOSIS — F418 Other specified anxiety disorders: Secondary | ICD-10-CM | POA: Diagnosis present

## 2013-11-18 DIAGNOSIS — E222 Syndrome of inappropriate secretion of antidiuretic hormone: Principal | ICD-10-CM | POA: Diagnosis present

## 2013-11-18 DIAGNOSIS — R531 Weakness: Secondary | ICD-10-CM

## 2013-11-18 DIAGNOSIS — I10 Essential (primary) hypertension: Secondary | ICD-10-CM | POA: Diagnosis not present

## 2013-11-18 DIAGNOSIS — S199XXA Unspecified injury of neck, initial encounter: Secondary | ICD-10-CM | POA: Diagnosis not present

## 2013-11-18 DIAGNOSIS — E869 Volume depletion, unspecified: Secondary | ICD-10-CM | POA: Diagnosis not present

## 2013-11-18 DIAGNOSIS — R4182 Altered mental status, unspecified: Secondary | ICD-10-CM | POA: Diagnosis not present

## 2013-11-18 DIAGNOSIS — E871 Hypo-osmolality and hyponatremia: Secondary | ICD-10-CM | POA: Diagnosis not present

## 2013-11-18 DIAGNOSIS — S0003XA Contusion of scalp, initial encounter: Secondary | ICD-10-CM | POA: Diagnosis not present

## 2013-11-18 DIAGNOSIS — D509 Iron deficiency anemia, unspecified: Secondary | ICD-10-CM | POA: Diagnosis present

## 2013-11-18 DIAGNOSIS — F0390 Unspecified dementia without behavioral disturbance: Secondary | ICD-10-CM | POA: Diagnosis present

## 2013-11-18 DIAGNOSIS — R197 Diarrhea, unspecified: Secondary | ICD-10-CM | POA: Diagnosis present

## 2013-11-18 DIAGNOSIS — D649 Anemia, unspecified: Secondary | ICD-10-CM | POA: Diagnosis present

## 2013-11-18 DIAGNOSIS — Z79899 Other long term (current) drug therapy: Secondary | ICD-10-CM

## 2013-11-18 DIAGNOSIS — G4730 Sleep apnea, unspecified: Secondary | ICD-10-CM | POA: Diagnosis present

## 2013-11-18 DIAGNOSIS — W06XXXA Fall from bed, initial encounter: Secondary | ICD-10-CM

## 2013-11-18 DIAGNOSIS — S299XXA Unspecified injury of thorax, initial encounter: Secondary | ICD-10-CM | POA: Diagnosis not present

## 2013-11-18 NOTE — ED Notes (Signed)
Pt states that she's weaker than normal and almost passed out on the bedside commode, pt is alert to person only, family helped her walk to the stretcher.

## 2013-11-18 NOTE — ED Provider Notes (Signed)
CSN: [id number]     Arrival date & time 11/18/13  2231 History   First MD Initiated Contact with Patient 11/18/13 2316     Chief Complaint  Patient presents with  . Weakness     (Consider location/radiation/quality/duration/timing/severity/associated sxs/prior Treatment) Patient is a 80 y.o. female presenting with weakness. The history is provided by a relative. The history is limited by the condition of the patient (Dementia).  Weakness  She was noted by her daughter to be more confused than normal. She has baseline dementia. She also apparently fell by her bed. Daughter is unsure she rolled out of bed or tried to stand up and fell. Of note, her mental status change occurred prior to the fall. She was noted to have any fever or chills. There's been no cough or vomiting. There's been no change in chronic diarrhea. She had been admitted to the hospital about 3 weeks ago with a similar presentation at which time she was hyponatremic. She also has problems with anemia and requires periodic transfusions.  Past Medical History  Diagnosis Date  . DUB (dysfunctional uterine bleeding)   . Thyroid disease     Hypothyroid  . Hypertension   . Dementia   . Elevated cholesterol   . Kidney stone   . Hyperlipidemia   . Depression with anxiety   . Sleep apnea   . Adenomatous colon polyp    Past Surgical History  Procedure Laterality Date  . Cholecystectomy    . Kidney stones    . Colon tumor-benign    . Abdominal hysterectomy    . Appendectomy     Family History  Problem Relation Age of Onset  . Hypertension Brother   . Heart disease Brother   . Diabetes type II Brother   . Diabetes type II Sister   . Heart disease Father   . Diabetes Mother   . Thyroid disease Child   . Diabetes Child    History  Substance Use Topics  . Smoking status: Never Smoker   . Smokeless tobacco: Never Used  . Alcohol Use: No   OB History   Grav Para Term Preterm Abortions TAB SAB Ect Mult Living   3  3 3       3      Review of Systems  Unable to perform ROS: Dementia  Neurological: Positive for weakness.      Allergies  Penicillins  Home Medications   Prior to Admission medications   Medication Sig Start Date End Date Taking? Authorizing Provider  galantamine (RAZADYNE ER) 16 MG 24 hr capsule Take 1 capsule (16 mg total) by mouth daily with breakfast. 06/17/13  Yes Larey Seat, MD  iron polysaccharides (NU-IRON) 150 MG capsule Take 150 mg by mouth daily.  06/02/12  Yes Historical Provider, MD  levothyroxine (SYNTHROID, LEVOTHROID) 100 MCG tablet Take 100 mcg by mouth daily before breakfast.   Yes Historical Provider, MD  NEXIUM 40 MG capsule Take 40 mg by mouth daily.  03/15/13  Yes Historical Provider, MD  nisoldipine (SULAR) 17 MG 24 hr tablet Take 17 mg by mouth daily.  03/13/09  Yes Historical Provider, MD  PRISTIQ 50 MG 24 hr tablet Take 50 mg by mouth daily.  04/05/13  Yes Historical Provider, MD  traZODone (DESYREL) 50 MG tablet Take 25 mg by mouth at bedtime.  02/16/13  Yes Historical Provider, MD  valsartan (DIOVAN) 320 MG tablet Take 320 mg by mouth daily.   Yes Historical Provider, MD  Vitamin D,  Ergocalciferol, (DRISDOL) 50000 UNITS CAPS capsule Take 50,000 Units by mouth every 7 (seven) days. Tuesday   Yes Historical Provider, MD   BP 192/81  Pulse 79  Temp(Src) 98.2 F (36.8 C) (Oral)  Resp 26  SpO2 100% Physical Exam  Nursing note and vitals reviewed.  80 year old female, resting comfortably and in no acute distress. Vital signs are significant for hypertension and tachypnea. Oxygen saturation is 100%, which is normal. Head is normocephalic and atraumatic. PERRLA, EOMI. Oropharynx is clear. Neck is nontender without adenopathy or JVD. Back is nontender and there is no CVA tenderness. Lungs are clear without rales, wheezes, or rhonchi. Chest is nontender. Heart has regular rate and rhythm without murmur. Abdomen is soft, flat, nontender without masses or  hepatosplenomegaly and peristalsis is normoactive. Extremities have no cyanosis or edema, full range of motion is present. Skin is warm and dry without rash. Neurologic: She is awake and oriented to person but not place or time, cranial nerves are intact, there are no motor or sensory deficits.  ED Course  Procedures (including critical care time) Labs Review Results for orders placed during the hospital encounter of 11/18/13  CBC WITH DIFFERENTIAL      Result Value Ref Range   WBC 7.2  4.0 - 10.5 K/uL   RBC 4.23  3.87 - 5.11 MIL/uL   Hemoglobin 11.8 (*) 12.0 - 15.0 g/dL   HCT 32.8 (*) 36.0 - 46.0 %   MCV 77.5 (*) 78.0 - 100.0 fL   MCH 27.9  26.0 - 34.0 pg   MCHC 36.0  30.0 - 36.0 g/dL   RDW 16.0 (*) 11.5 - 15.5 %   Platelets 301  150 - 400 K/uL   Neutrophils Relative % 78 (*) 43 - 77 %   Neutro Abs 5.7  1.7 - 7.7 K/uL   Lymphocytes Relative 11 (*) 12 - 46 %   Lymphs Abs 0.8  0.7 - 4.0 K/uL   Monocytes Relative 11  3 - 12 %   Monocytes Absolute 0.8  0.1 - 1.0 K/uL   Eosinophils Relative 0  0 - 5 %   Eosinophils Absolute 0.0  0.0 - 0.7 K/uL   Basophils Relative 0  0 - 1 %   Basophils Absolute 0.0  0.0 - 0.1 K/uL  COMPREHENSIVE METABOLIC PANEL      Result Value Ref Range   Sodium 110 (*) 137 - 147 mEq/L   Potassium 4.2  3.7 - 5.3 mEq/L   Chloride 74 (*) 96 - 112 mEq/L   CO2 24  19 - 32 mEq/L   Glucose, Bld 114 (*) 70 - 99 mg/dL   BUN 9  6 - 23 mg/dL   Creatinine, Ser 0.73  0.50 - 1.10 mg/dL   Calcium 9.9  8.4 - 10.5 mg/dL   Total Protein 7.4  6.0 - 8.3 g/dL   Albumin 4.0  3.5 - 5.2 g/dL   AST 16  0 - 37 U/L   ALT 10  0 - 35 U/L   Alkaline Phosphatase 60  39 - 117 U/L   Total Bilirubin 0.6  0.3 - 1.2 mg/dL   GFR calc non Af Amer 79 (*) >90 mL/min   GFR calc Af Amer >90  >90 mL/min   Anion gap 12  5 - 15  URINALYSIS, ROUTINE W REFLEX MICROSCOPIC      Result Value Ref Range   Color, Urine YELLOW  YELLOW   APPearance CLEAR  CLEAR   Specific  Gravity, Urine 1.004 (*)  1.005 - 1.030   pH 7.0  5.0 - 8.0   Glucose, UA NEGATIVE  NEGATIVE mg/dL   Hgb urine dipstick TRACE (*) NEGATIVE   Bilirubin Urine NEGATIVE  NEGATIVE   Ketones, ur NEGATIVE  NEGATIVE mg/dL   Protein, ur NEGATIVE  NEGATIVE mg/dL   Urobilinogen, UA 0.2  0.0 - 1.0 mg/dL   Nitrite NEGATIVE  NEGATIVE   Leukocytes, UA NEGATIVE  NEGATIVE  URINE MICROSCOPIC-ADD ON      Result Value Ref Range   Squamous Epithelial / LPF RARE  RARE   WBC, UA 0-2  <3 WBC/hpf   RBC / HPF 0-2  <3 RBC/hpf   Imaging Review Dg Chest 1 View  11/19/2013   CLINICAL DATA:  Unwitnessed fall.  Altered mental status.  EXAM: CHEST - 1 VIEW  COMPARISON:  Knee 10/2013  FINDINGS: Shallow inspiration appear The heart size and mediastinal contours are within normal limits. Both lungs are clear. The visualized skeletal structures are unremarkable.  IMPRESSION: No active disease.   Electronically Signed   By: Lucienne Capers M.D.   On: 11/19/2013 00:22     EKG Interpretation   Date/Time:  Thursday November 18 2013 22:53:25 EDT Ventricular Rate:  72 PR Interval:  196 QRS Duration: 78 QT Interval:  410 QTC Calculation: 448 R Axis:   9 Text Interpretation:  Sinus rhythm with Premature atrial complexes Septal  infarct , age undetermined Abnormal ECG When compared with ECG of  09/07/2013, No significant change was found Confirmed by [facility]  MD, Ashlie Mcmenamy  ([id number]) on 11/18/2013 11:01:28 PM      MDM   Final diagnoses:  Altered mental status, unspecified altered mental status type  Fall from bed  Hyponatremia    Altered mental status of uncertain cause. Fall without obvious injury. She was sent for CT of head and cervical spine. Altered mental status workup included electrolytes, chest x-ray, urinalysis. Her records are reviewed confirming that she had been admitted for severe hyponatremia 3 weeks ago-admitting sodium was 112. Discharge summary states that her usual sodium is 128-130.  Workup is significant only for sodium of  110. Given the recurrence of hyponatremia, I wonder about possible syndrome of inappropriate ADH secretion. She is on several medications. This can be assessed during hospital admission. Case is discussed with Dr. Dagmar Hait who is on call for Dr. Kevan Ny agrees to admit the patient. She is given a liter of saline and started on a saline drip.  Delora Fuel, MD 88/82/80 [phone]

## 2013-11-19 DIAGNOSIS — S0003XA Contusion of scalp, initial encounter: Secondary | ICD-10-CM | POA: Diagnosis not present

## 2013-11-19 DIAGNOSIS — Z79899 Other long term (current) drug therapy: Secondary | ICD-10-CM | POA: Diagnosis not present

## 2013-11-19 DIAGNOSIS — I10 Essential (primary) hypertension: Secondary | ICD-10-CM | POA: Diagnosis present

## 2013-11-19 DIAGNOSIS — F418 Other specified anxiety disorders: Secondary | ICD-10-CM | POA: Diagnosis present

## 2013-11-19 DIAGNOSIS — E871 Hypo-osmolality and hyponatremia: Secondary | ICD-10-CM | POA: Diagnosis not present

## 2013-11-19 DIAGNOSIS — F0390 Unspecified dementia without behavioral disturbance: Secondary | ICD-10-CM | POA: Diagnosis present

## 2013-11-19 DIAGNOSIS — R531 Weakness: Secondary | ICD-10-CM | POA: Diagnosis not present

## 2013-11-19 DIAGNOSIS — D649 Anemia, unspecified: Secondary | ICD-10-CM | POA: Diagnosis present

## 2013-11-19 DIAGNOSIS — S299XXA Unspecified injury of thorax, initial encounter: Secondary | ICD-10-CM | POA: Diagnosis not present

## 2013-11-19 DIAGNOSIS — R631 Polydipsia: Secondary | ICD-10-CM | POA: Diagnosis present

## 2013-11-19 DIAGNOSIS — E039 Hypothyroidism, unspecified: Secondary | ICD-10-CM | POA: Diagnosis present

## 2013-11-19 DIAGNOSIS — S199XXA Unspecified injury of neck, initial encounter: Secondary | ICD-10-CM | POA: Diagnosis not present

## 2013-11-19 DIAGNOSIS — E222 Syndrome of inappropriate secretion of antidiuretic hormone: Secondary | ICD-10-CM | POA: Diagnosis present

## 2013-11-19 DIAGNOSIS — G4730 Sleep apnea, unspecified: Secondary | ICD-10-CM | POA: Diagnosis present

## 2013-11-19 DIAGNOSIS — E785 Hyperlipidemia, unspecified: Secondary | ICD-10-CM | POA: Diagnosis present

## 2013-11-19 DIAGNOSIS — R4182 Altered mental status, unspecified: Secondary | ICD-10-CM | POA: Diagnosis not present

## 2013-11-19 LAB — SODIUM, URINE, RANDOM: SODIUM UR: 32 meq/L

## 2013-11-19 LAB — CBC WITH DIFFERENTIAL/PLATELET
Basophils Absolute: 0 10*3/uL (ref 0.0–0.1)
Basophils Relative: 0 % (ref 0–1)
Eosinophils Absolute: 0 10*3/uL (ref 0.0–0.7)
Eosinophils Relative: 0 % (ref 0–5)
HCT: 32.8 % — ABNORMAL LOW (ref 36.0–46.0)
HEMOGLOBIN: 11.8 g/dL — AB (ref 12.0–15.0)
LYMPHS ABS: 0.8 10*3/uL (ref 0.7–4.0)
Lymphocytes Relative: 11 % — ABNORMAL LOW (ref 12–46)
MCH: 27.9 pg (ref 26.0–34.0)
MCHC: 36 g/dL (ref 30.0–36.0)
MCV: 77.5 fL — ABNORMAL LOW (ref 78.0–100.0)
MONOS PCT: 11 % (ref 3–12)
Monocytes Absolute: 0.8 10*3/uL (ref 0.1–1.0)
NEUTROS ABS: 5.7 10*3/uL (ref 1.7–7.7)
NEUTROS PCT: 78 % — AB (ref 43–77)
PLATELETS: 301 10*3/uL (ref 150–400)
RBC: 4.23 MIL/uL (ref 3.87–5.11)
RDW: 16 % — ABNORMAL HIGH (ref 11.5–15.5)
WBC: 7.2 10*3/uL (ref 4.0–10.5)

## 2013-11-19 LAB — COMPREHENSIVE METABOLIC PANEL
ALBUMIN: 4 g/dL (ref 3.5–5.2)
ALT: 10 U/L (ref 0–35)
ANION GAP: 12 (ref 5–15)
AST: 16 U/L (ref 0–37)
Alkaline Phosphatase: 60 U/L (ref 39–117)
BILIRUBIN TOTAL: 0.6 mg/dL (ref 0.3–1.2)
BUN: 9 mg/dL (ref 6–23)
CHLORIDE: 74 meq/L — AB (ref 96–112)
CO2: 24 mEq/L (ref 19–32)
Calcium: 9.9 mg/dL (ref 8.4–10.5)
Creatinine, Ser: 0.73 mg/dL (ref 0.50–1.10)
GFR calc non Af Amer: 79 mL/min — ABNORMAL LOW (ref >90)
GLUCOSE: 114 mg/dL — AB (ref 70–99)
Potassium: 4.2 mEq/L (ref 3.7–5.3)
SODIUM: 110 meq/L — AB (ref 137–147)
TOTAL PROTEIN: 7.4 g/dL (ref 6.0–8.3)

## 2013-11-19 LAB — URINALYSIS, ROUTINE W REFLEX MICROSCOPIC
BILIRUBIN URINE: NEGATIVE
Bilirubin Urine: NEGATIVE
GLUCOSE, UA: NEGATIVE mg/dL
Glucose, UA: NEGATIVE mg/dL
Hgb urine dipstick: NEGATIVE
Ketones, ur: NEGATIVE mg/dL
Ketones, ur: NEGATIVE mg/dL
Leukocytes, UA: NEGATIVE
Leukocytes, UA: NEGATIVE
NITRITE: NEGATIVE
Nitrite: NEGATIVE
PH: 7 (ref 5.0–8.0)
Protein, ur: NEGATIVE mg/dL
Protein, ur: NEGATIVE mg/dL
SPECIFIC GRAVITY, URINE: 1.003 — AB (ref 1.005–1.030)
SPECIFIC GRAVITY, URINE: 1.004 — AB (ref 1.005–1.030)
Urobilinogen, UA: 0.2 mg/dL (ref 0.0–1.0)
Urobilinogen, UA: 0.2 mg/dL (ref 0.0–1.0)
pH: 7 (ref 5.0–8.0)

## 2013-11-19 LAB — URINE MICROSCOPIC-ADD ON

## 2013-11-19 LAB — CORTISOL: CORTISOL PLASMA: 21.9 ug/dL

## 2013-11-19 LAB — BASIC METABOLIC PANEL
ANION GAP: 12 (ref 5–15)
BUN: 8 mg/dL (ref 6–23)
CHLORIDE: 79 meq/L — AB (ref 96–112)
CO2: 26 mEq/L (ref 19–32)
Calcium: 9.6 mg/dL (ref 8.4–10.5)
Creatinine, Ser: 0.71 mg/dL (ref 0.50–1.10)
GFR, EST NON AFRICAN AMERICAN: 80 mL/min — AB (ref >90)
Glucose, Bld: 103 mg/dL — ABNORMAL HIGH (ref 70–99)
POTASSIUM: 4.2 meq/L (ref 3.7–5.3)
SODIUM: 117 meq/L — AB (ref 137–147)

## 2013-11-19 LAB — TSH: TSH: 4.68 u[IU]/mL — AB (ref 0.350–4.500)

## 2013-11-19 MED ORDER — DESVENLAFAXINE SUCCINATE ER 50 MG PO TB24
50.0000 mg | ORAL_TABLET | Freq: Every day | ORAL | Status: DC
  Administered 2013-11-19 – 2013-11-21 (×3): 50 mg via ORAL
  Filled 2013-11-19 (×3): qty 1

## 2013-11-19 MED ORDER — VITAMIN D (ERGOCALCIFEROL) 1.25 MG (50000 UNIT) PO CAPS: 50000.0000 [IU] | ORAL_CAPSULE | ORAL | Status: DC

## 2013-11-19 MED ORDER — POLYSACCHARIDE IRON COMPLEX 150 MG PO CAPS
150.0000 mg | ORAL_CAPSULE | Freq: Every day | ORAL | Status: DC
  Administered 2013-11-19 – 2013-11-21 (×3): 150 mg via ORAL
  Filled 2013-11-19 (×3): qty 1

## 2013-11-19 MED ORDER — ACETAMINOPHEN 325 MG PO TABS
650.0000 mg | ORAL_TABLET | Freq: Four times a day (QID) | ORAL | Status: DC | PRN
  Filled 2013-11-19: qty 2

## 2013-11-19 MED ORDER — ENOXAPARIN SODIUM 40 MG/0.4ML ~~LOC~~ SOLN
40.0000 mg | SUBCUTANEOUS | Status: DC
  Administered 2013-11-19 – 2013-11-21 (×3): 40 mg via SUBCUTANEOUS
  Filled 2013-11-19 (×4): qty 0.4

## 2013-11-19 MED ORDER — SODIUM CHLORIDE 0.9 % IV SOLN
INTRAVENOUS | Status: DC
  Administered 2013-11-19: 04:00:00 via INTRAVENOUS

## 2013-11-19 MED ORDER — NISOLDIPINE ER 17 MG PO TB24
17.0000 mg | ORAL_TABLET | Freq: Every day | ORAL | Status: DC
  Administered 2013-11-19 – 2013-11-21 (×3): 17 mg via ORAL
  Filled 2013-11-19 (×3): qty 1

## 2013-11-19 MED ORDER — LEVOTHYROXINE SODIUM 100 MCG PO TABS
100.0000 ug | ORAL_TABLET | Freq: Every day | ORAL | Status: DC
  Administered 2013-11-20 – 2013-11-21 (×2): 100 ug via ORAL
  Filled 2013-11-19 (×3): qty 1

## 2013-11-19 MED ORDER — IRBESARTAN 150 MG PO TABS
150.0000 mg | ORAL_TABLET | Freq: Every day | ORAL | Status: DC
  Administered 2013-11-19 – 2013-11-21 (×3): 150 mg via ORAL
  Filled 2013-11-19 (×3): qty 1

## 2013-11-19 MED ORDER — SODIUM CHLORIDE 0.9 % IV SOLN: 1000.0000 mL | INTRAVENOUS | Status: DC

## 2013-11-19 MED ORDER — SODIUM CHLORIDE 0.9 % IV SOLN: INTRAVENOUS | Status: DC

## 2013-11-19 MED ORDER — ONDANSETRON HCL 4 MG/2ML IJ SOLN: 4.0000 mg | Freq: Four times a day (QID) | INTRAMUSCULAR | Status: DC | PRN

## 2013-11-19 MED ORDER — GALANTAMINE HYDROBROMIDE ER 8 MG PO CP24
16.0000 mg | ORAL_CAPSULE | Freq: Every day | ORAL | Status: DC
  Administered 2013-11-21: 16 mg via ORAL

## 2013-11-19 MED ORDER — SODIUM CHLORIDE 0.9 % IV SOLN
1000.0000 mL | Freq: Once | INTRAVENOUS | Status: AC
Start: 2013-11-19 — End: 2013-11-19
  Administered 2013-11-19: 1000 mL via INTRAVENOUS

## 2013-11-19 MED ORDER — PANTOPRAZOLE SODIUM 40 MG PO TBEC
40.0000 mg | DELAYED_RELEASE_TABLET | Freq: Every day | ORAL | Status: DC
  Administered 2013-11-19 – 2013-11-21 (×3): 40 mg via ORAL
  Filled 2013-11-19 (×3): qty 1

## 2013-11-19 MED ORDER — TRAZODONE 25 MG HALF TABLET
25.0000 mg | ORAL_TABLET | Freq: Every day | ORAL | Status: DC
  Administered 2013-11-19 – 2013-11-20 (×2): 25 mg via ORAL
  Filled 2013-11-19 (×3): qty 1

## 2013-11-19 NOTE — ED Notes (Signed)
Attempted PIV insertion x 2, no success, second RN to assess.

## 2013-11-19 NOTE — Progress Notes (Signed)
RN telephoned the MD office re: Sodium 117. MD on call was paged. Awaiting return call. Will continue to monitor.

## 2013-11-19 NOTE — Progress Notes (Signed)
On call MD called back about sodium level 117. Stated to change rate of fluids to 75/hr.

## 2013-11-19 NOTE — H&P (Signed)
PCP:   Sheela Stack, MD   Chief Complaint:  Weakness and confusion  HPI: This is a 80 year old white female with moderately advanced dementia and chronic and recurrent hyponatremia. She's in her state of fair health in the last several days. Last evening she is noting more confused. She did fall at bed and CT is done in the ER there is unremarkable She is brought in and found to have recurrence of her hyponatremia to a level of 110. She's been treated now with saline but she responds to nicely. She's come up slowly to 117. She does sleep much sore mental status is a bit hard to evaluate but doesn't seem off her baseline. There is no evidence of any seizure activity. When questioning, her daughter reports she starts streaking early in the morning and drinks throughout the day. They'll to make Pepsi and water. I suspect this is a polydipsia with volume expansion. 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 May have to consider starting some demeclocycline if we can control this otherwise. Her dementia makes her cooperation difficult. Is no evidence of any infection. She doesn't seem to be in any discomfort. Her EKG is nonischemic. The morning cortisol and TSH are ordered today as well as a spot urine sodium. Per her daughter her weight has been stable. She's not had any vomiting. She has her chronic nausea. Her food intake is little. She's had no signs of kidney stone pain.   Review of Systems:  Review of Systems - unobtainable Past Medical History: Past Medical History  Diagnosis Date  . DUB (dysfunctional uterine bleeding)   . Thyroid disease     Hypothyroid  . Hypertension   . Dementia   . Elevated cholesterol   . Kidney stone   . Hyperlipidemia   . Depression with anxiety   . Sleep apnea   . Adenomatous colon polyp    Past Surgical History  Procedure Laterality Date  . Cholecystectomy    . Kidney stones    . Colon tumor-benign    . Abdominal  hysterectomy    . Appendectomy      Medications: Prior to Admission medications   Medication Sig Start Date End Date Taking? Authorizing Provider  galantamine (RAZADYNE ER) 16 MG 24 hr capsule Take 1 capsule (16 mg total) by mouth daily with breakfast. 06/17/13  Yes Larey Seat, MD  iron polysaccharides (NU-IRON) 150 MG capsule Take 150 mg by mouth daily.  06/02/12  Yes Historical Provider, MD  levothyroxine (SYNTHROID, LEVOTHROID) 100 MCG tablet Take 100 mcg by mouth daily before breakfast.   Yes Historical Provider, MD  NEXIUM 40 MG capsule Take 40 mg by mouth daily.  03/15/13  Yes Historical Provider, MD  nisoldipine (SULAR) 17 MG 24 hr tablet Take 17 mg by mouth daily.  03/13/09  Yes Historical Provider, MD  PRISTIQ 50 MG 24 hr tablet Take 50 mg by mouth daily.  04/05/13  Yes Historical Provider, MD  traZODone (DESYREL) 50 MG tablet Take 25 mg by mouth at bedtime.  02/16/13  Yes Historical Provider, MD  valsartan (DIOVAN) 320 MG tablet Take 320 mg by mouth daily.   Yes Historical Provider, MD  Vitamin D, Ergocalciferol, (DRISDOL) 50000 UNITS CAPS capsule Take 50,000 Units by mouth every 7 (seven) days. Tuesday   Yes Historical Provider, MD    Allergies:   Allergies  Allergen Reactions  . Penicillins     unknown    Social History:  reports that  she has never smoked. She has never used smokeless tobacco. She reports that she does not drink alcohol or use illicit drugs.  Family History: Family History  Problem Relation Age of Onset  . Hypertension Brother   . Heart disease Brother   . Diabetes type II Brother   . Diabetes type II Sister   . Heart disease Father   . Diabetes Mother   . Thyroid disease Child   . Diabetes Child     Physical Exam: Filed Vitals:   11/18/13 2253 11/19/13 0257 11/19/13 0324 11/19/13 0601  BP: 192/81 140/42 160/60 161/61  Pulse: 79 68 77 70  Temp: 98.2 F (36.8 C) 98 F (36.7 C) 98.7 F (37.1 C) 98.9 F (37.2 C)  TempSrc: Oral  Oral Oral   Resp: 26 20 20 20   Height:   5' (1.524 m)   Weight:   59.8 kg (131 lb 13.4 oz)   SpO2: 100% 100% 100% 100%   General appearance: Frail chronically ill-appearing white female lying quietly on her back Oral mucous membranes appear moist Resp: Clear with no wheezes rales or rhonchi. No accessory muscles in use Cardio: Regular with systolic murmur and occasional skips GI: soft, non-tender; bowel sounds slightly reduced no masses,  no organomegaly Extremities: extremities normal, atraumatic, no cyanosis or edema Distal pulses are intact She is sleeping and was not awakened . Basal cell carcinoma still her left cheek     Labs on Admission:   Recent Labs  11/18/13 2345 11/19/13 0441  NA 110* 117*  K 4.2 4.2  CL 74* 79*  CO2 24 26  GLUCOSE 114* 103*  BUN 9 8  CREATININE 0.73 0.71  CALCIUM 9.9 9.6    Recent Labs  11/18/13 2345  AST 16  ALT 10  ALKPHOS 60  BILITOT 0.6  PROT 7.4  ALBUMIN 4.0     Recent Labs  11/18/13 2345  WBC 7.2  NEUTROABS 5.7  HGB 11.8*  HCT 32.8*  MCV 77.5*  PLT 301        Radiological Exams on Admission: Dg Chest 1 View  11/19/2013   CLINICAL DATA:  Unwitnessed fall.  Altered mental status.  EXAM: CHEST - 1 VIEW  COMPARISON:  Knee 10/2013  FINDINGS: Shallow inspiration appear The heart size and mediastinal contours are within normal limits. Both lungs are clear. The visualized skeletal structures are unremarkable.  IMPRESSION: No active disease.   Electronically Signed   By: Lucienne Capers M.D.   On: 11/19/2013 00:22   Ct Head Wo Contrast  11/19/2013   CLINICAL DATA:  80 year old year female fell out of bed, confusion. Initial encounter.  EXAM: CT HEAD WITHOUT CONTRAST  CT CERVICAL SPINE WITHOUT CONTRAST  TECHNIQUE: Multidetector CT imaging of the head and cervical spine was performed following the standard protocol without intravenous contrast. Multiplanar CT image reconstructions of the cervical spine were also generated.   COMPARISON:  Head CT without contrast 09/06/2013 and earlier.  FINDINGS: CT HEAD FINDINGS  Stable paranasal sinuses and mastoids. Left anterior convexity scalp hematoma measuring up to 4 mm in thickness. Underlying calvarium intact. Visualized orbit soft tissues are within normal limits.  Calcified atherosclerosis at the skull base. Stable cerebral volume. No ventriculomegaly. No midline shift, mass effect, or evidence of intracranial mass lesion. Patchy cerebral white matter hypodensity appears stable. No evidence of cortically based acute infarction identified. No acute intracranial hemorrhage identified. No suspicious intracranial vascular hyperdensity.  CT CERVICAL SPINE FINDINGS  Straightening of cervical lordosis. Osteopenia  and degenerative sclerosis in the cervical spine. Cervicothoracic junction alignment is within normal limits. Visualized skull base is intact. No atlanto-occipital dissociation. Bilateral posterior element alignment is within normal limits. Diffuse disc and endplate degeneration. No acute cervical spine fracture identified. Grossly intact visualized upper thoracic levels. Negative non contrast paraspinal soft tissues.  IMPRESSION: 1. Scalp hematoma without underlying fracture. 2.  No acute intracranial abnormality. 3. No acute fracture or listhesis identified in the cervical spine. Ligamentous injury is not excluded.   Electronically Signed   By: Lars Pinks M.D.   On: 11/19/2013 00:36   Ct Cervical Spine Wo Contrast  11/19/2013   CLINICAL DATA:  80 year old year female fell out of bed, confusion. Initial encounter.  EXAM: CT HEAD WITHOUT CONTRAST  CT CERVICAL SPINE WITHOUT CONTRAST  TECHNIQUE: Multidetector CT imaging of the head and cervical spine was performed following the standard protocol without intravenous contrast. Multiplanar CT image reconstructions of the cervical spine were also generated.  COMPARISON:  Head CT without contrast 09/06/2013 and earlier.  FINDINGS: CT HEAD  FINDINGS  Stable paranasal sinuses and mastoids. Left anterior convexity scalp hematoma measuring up to 4 mm in thickness. Underlying calvarium intact. Visualized orbit soft tissues are within normal limits.  Calcified atherosclerosis at the skull base. Stable cerebral volume. No ventriculomegaly. No midline shift, mass effect, or evidence of intracranial mass lesion. Patchy cerebral white matter hypodensity appears stable. No evidence of cortically based acute infarction identified. No acute intracranial hemorrhage identified. No suspicious intracranial vascular hyperdensity.  CT CERVICAL SPINE FINDINGS  Straightening of cervical lordosis. Osteopenia and degenerative sclerosis in the cervical spine. Cervicothoracic junction alignment is within normal limits. Visualized skull base is intact. No atlanto-occipital dissociation. Bilateral posterior element alignment is within normal limits. Diffuse disc and endplate degeneration. No acute cervical spine fracture identified. Grossly intact visualized upper thoracic levels. Negative non contrast paraspinal soft tissues.  IMPRESSION: 1. Scalp hematoma without underlying fracture. 2.  No acute intracranial abnormality. 3. No acute fracture or listhesis identified in the cervical spine. Ligamentous injury is not excluded.   Electronically Signed   By: Lars Pinks M.D.   On: 11/19/2013 00:36   Orders placed during the hospital encounter of 11/18/13  . EKG 12-LEAD: Sinus rhythm with Premature atrial complexes Septal infarct , age undetermined Abnormal ECG When compared with ECG of 09/07/2013, No significant change was found Confirmed by [facility] MD, DAVID ([id number]) on 11/18/2013 11:01:28 PM  . EKG 12-LEAD    Assessment/Plan Principal Problem:   Hyponatremia: A recurrent issue. He appears to be partially based on polydipsia in the setting of her dementia. We will recheck her morning cortisol and thyroid levels again. Check a spot urine sodium. I do not think she needs a  Vaptan or 3% sodium at the present time. She's artery up to 117 with some fluids. Her baseline is about the mid 120s to 1:30. Hopefully we can resolve this slowly enough. Active Problems:   Hypertension: Continue medications   Dementia: Please moderately advanced on medications   Weakness: Chronic   Hypothyroid: On medications, check level   Anemia: Improved, continue medications   Diarrhea: This is more of a loose stool problem chronically. Probably due to poor by mouth intake. I do not think she has C. difficile or any other significant problem like this.   Khianna Blazina ALAN 11/19/2013, 8:22 AM

## 2013-11-19 NOTE — Progress Notes (Signed)
CRITICAL VALUE ALERT  Critical value received: Sodium 117  Date of notification:  11/19/13  Time of notification:  0554  Critical value read back:yes  Nurse who received alert:  Dellie Catholic  MD notified (1st page):  Yes  Time of first page: 0600  :

## 2013-11-20 LAB — COMPREHENSIVE METABOLIC PANEL
ALK PHOS: 55 U/L (ref 39–117)
ALT: 9 U/L (ref 0–35)
ANION GAP: 11 (ref 5–15)
AST: 17 U/L (ref 0–37)
Albumin: 3.6 g/dL (ref 3.5–5.2)
BILIRUBIN TOTAL: 0.4 mg/dL (ref 0.3–1.2)
BUN: 11 mg/dL (ref 6–23)
CHLORIDE: 89 meq/L — AB (ref 96–112)
CO2: 26 mEq/L (ref 19–32)
Calcium: 9.5 mg/dL (ref 8.4–10.5)
Creatinine, Ser: 0.89 mg/dL (ref 0.50–1.10)
GFR calc Af Amer: 70 mL/min — ABNORMAL LOW (ref >90)
GFR calc non Af Amer: 60 mL/min — ABNORMAL LOW (ref >90)
Glucose, Bld: 85 mg/dL (ref 70–99)
Potassium: 3.9 mEq/L (ref 3.7–5.3)
SODIUM: 126 meq/L — AB (ref 137–147)
TOTAL PROTEIN: 6.7 g/dL (ref 6.0–8.3)

## 2013-11-20 LAB — CBC
HEMATOCRIT: 32.4 % — AB (ref 36.0–46.0)
Hemoglobin: 11.3 g/dL — ABNORMAL LOW (ref 12.0–15.0)
MCH: 28 pg (ref 26.0–34.0)
MCHC: 34.9 g/dL (ref 30.0–36.0)
MCV: 80.2 fL (ref 78.0–100.0)
Platelets: 336 10*3/uL (ref 150–400)
RBC: 4.04 MIL/uL (ref 3.87–5.11)
RDW: 16.4 % — AB (ref 11.5–15.5)
WBC: 6.5 10*3/uL (ref 4.0–10.5)

## 2013-11-20 MED ORDER — SODIUM CHLORIDE 0.9 % IV SOLN
1000.0000 mL | INTRAVENOUS | Status: DC
  Administered 2013-11-20 – 2013-11-21 (×2): 1000 mL via INTRAVENOUS

## 2013-11-20 NOTE — Progress Notes (Signed)
Subjective: She is alert. She was up all night. Daughter in room with her. No pain.  She thinks she is at home. Daughter reports that she normally drinks 4 liters of pepsi a day at home and water on top of that.  Objective: Vital signs in last 24 hours: Temp:  [98.1 F (36.7 C)-98.9 F (37.2 C)] 98.9 F (37.2 C) (10/23 2121) Pulse Rate:  [74-75] 74 (10/23 2121) Resp:  [20] 20 (10/23 2121) BP: (134-148)/(49-50) 148/49 mmHg (10/23 2121) SpO2:  [99 %] 99 % (10/23 2121) Weight change:     Intake/Output from previous day: 10/23 0701 - 10/24 0700 In: 840 [P.O.:240; I.V.:600] Out: 2650 [Urine:2650] Intake/Output this shift:    General appearance: alert and appears stated age Resp: clear to auscultation bilaterally Cardio: regular rate and rhythm, S1, S2 normal, no murmur, click, rub or gallop GI: soft, non-tender; bowel sounds normal; no masses,  no organomegaly Extremities: extremities normal, atraumatic, no cyanosis or edema Neurologic: Grossly normal demented  Lab Results:  Recent Labs  11/18/13 2345 11/20/13 0432  WBC 7.2 6.5  HGB 11.8* 11.3*  HCT 32.8* 32.4*  PLT 301 336   BMET  Recent Labs  11/19/13 0441 11/20/13 0432  NA 117* 126*  K 4.2 3.9  CL 79* 89*  CO2 26 26  GLUCOSE 103* 85  BUN 8 11  CREATININE 0.71 0.89  CALCIUM 9.6 9.5   CMET CMP     Component Value Date/Time   NA 126* 11/20/2013 0432   NA 131* 04/28/2013 1021   K 3.9 11/20/2013 0432   CL 89* 11/20/2013 0432   CO2 26 11/20/2013 0432   GLUCOSE 85 11/20/2013 0432   GLUCOSE 96 04/28/2013 1021   BUN 11 11/20/2013 0432   BUN 11 04/28/2013 1021   CREATININE 0.89 11/20/2013 0432   CALCIUM 9.5 11/20/2013 0432   PROT 6.7 11/20/2013 0432   PROT 6.8 04/28/2013 1021   ALBUMIN 3.6 11/20/2013 0432   AST 17 11/20/2013 0432   ALT 9 11/20/2013 0432   ALKPHOS 55 11/20/2013 0432   BILITOT 0.4 11/20/2013 0432   GFRNONAA 60* 11/20/2013 0432   GFRAA 70* 11/20/2013 0432     Studies/Results: Dg Chest 1  View  11/19/2013   CLINICAL DATA:  Unwitnessed fall.  Altered mental status.  EXAM: CHEST - 1 VIEW  COMPARISON:  Knee 10/2013  FINDINGS: Shallow inspiration appear The heart size and mediastinal contours are within normal limits. Both lungs are clear. The visualized skeletal structures are unremarkable.  IMPRESSION: No active disease.   Electronically Signed   By: Lucienne Capers M.D.   On: 11/19/2013 00:22   Ct Head Wo Contrast  11/19/2013   CLINICAL DATA:  80 year old year female fell out of bed, confusion. Initial encounter.  EXAM: CT HEAD WITHOUT CONTRAST  CT CERVICAL SPINE WITHOUT CONTRAST  TECHNIQUE: Multidetector CT imaging of the head and cervical spine was performed following the standard protocol without intravenous contrast. Multiplanar CT image reconstructions of the cervical spine were also generated.  COMPARISON:  Head CT without contrast 09/06/2013 and earlier.  FINDINGS: CT HEAD FINDINGS  Stable paranasal sinuses and mastoids. Left anterior convexity scalp hematoma measuring up to 4 mm in thickness. Underlying calvarium intact. Visualized orbit soft tissues are within normal limits.  Calcified atherosclerosis at the skull base. Stable cerebral volume. No ventriculomegaly. No midline shift, mass effect, or evidence of intracranial mass lesion. Patchy cerebral white matter hypodensity appears stable. No evidence of cortically based acute infarction identified. No  acute intracranial hemorrhage identified. No suspicious intracranial vascular hyperdensity.  CT CERVICAL SPINE FINDINGS  Straightening of cervical lordosis. Osteopenia and degenerative sclerosis in the cervical spine. Cervicothoracic junction alignment is within normal limits. Visualized skull base is intact. No atlanto-occipital dissociation. Bilateral posterior element alignment is within normal limits. Diffuse disc and endplate degeneration. No acute cervical spine fracture identified. Grossly intact visualized upper thoracic levels.  Negative non contrast paraspinal soft tissues.  IMPRESSION: 1. Scalp hematoma without underlying fracture. 2.  No acute intracranial abnormality. 3. No acute fracture or listhesis identified in the cervical spine. Ligamentous injury is not excluded.   Electronically Signed   By: Lars Pinks M.D.   On: 11/19/2013 00:36   Ct Cervical Spine Wo Contrast  11/19/2013   CLINICAL DATA:  80 year old year female fell out of bed, confusion. Initial encounter.  EXAM: CT HEAD WITHOUT CONTRAST  CT CERVICAL SPINE WITHOUT CONTRAST  TECHNIQUE: Multidetector CT imaging of the head and cervical spine was performed following the standard protocol without intravenous contrast. Multiplanar CT image reconstructions of the cervical spine were also generated.  COMPARISON:  Head CT without contrast 09/06/2013 and earlier.  FINDINGS: CT HEAD FINDINGS  Stable paranasal sinuses and mastoids. Left anterior convexity scalp hematoma measuring up to 4 mm in thickness. Underlying calvarium intact. Visualized orbit soft tissues are within normal limits.  Calcified atherosclerosis at the skull base. Stable cerebral volume. No ventriculomegaly. No midline shift, mass effect, or evidence of intracranial mass lesion. Patchy cerebral white matter hypodensity appears stable. No evidence of cortically based acute infarction identified. No acute intracranial hemorrhage identified. No suspicious intracranial vascular hyperdensity.  CT CERVICAL SPINE FINDINGS  Straightening of cervical lordosis. Osteopenia and degenerative sclerosis in the cervical spine. Cervicothoracic junction alignment is within normal limits. Visualized skull base is intact. No atlanto-occipital dissociation. Bilateral posterior element alignment is within normal limits. Diffuse disc and endplate degeneration. No acute cervical spine fracture identified. Grossly intact visualized upper thoracic levels. Negative non contrast paraspinal soft tissues.  IMPRESSION: 1. Scalp hematoma without  underlying fracture. 2.  No acute intracranial abnormality. 3. No acute fracture or listhesis identified in the cervical spine. Ligamentous injury is not excluded.   Electronically Signed   By: Lars Pinks M.D.   On: 11/19/2013 00:36    Medications: I have reviewed the patient's current medications. Marland Kitchen desvenlafaxine  50 mg Oral Daily  . enoxaparin (LOVENOX) injection  40 mg Subcutaneous Q24H  . galantamine  16 mg Oral Q breakfast  . irbesartan  150 mg Oral Daily  . iron polysaccharides  150 mg Oral Daily  . levothyroxine  100 mcg Oral QAC breakfast  . nisoldipine  17 mg Oral Daily  . pantoprazole  40 mg Oral Daily  . traZODone  25 mg Oral QHS  . 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 Discussed with daughter. Limited free water intake.  May be able to d/c home tomorrow if sodium up a bit further. Active Problems:   Hypertension-stable   Dementia-severe   Weakness-follow   Hypothyroid-cont. Treatment.   Anemia-mild   Diarrhea-none reported today.   LOS: 2 days   Jerlyn Ly, MD 11/20/2013, 11:59 AM

## 2013-11-21 LAB — CBC WITH DIFFERENTIAL/PLATELET
BASOS PCT: 1 % (ref 0–1)
Basophils Absolute: 0.1 10*3/uL (ref 0.0–0.1)
EOS ABS: 0.1 10*3/uL (ref 0.0–0.7)
Eosinophils Relative: 1 % (ref 0–5)
HCT: 34.3 % — ABNORMAL LOW (ref 36.0–46.0)
Hemoglobin: 11.9 g/dL — ABNORMAL LOW (ref 12.0–15.0)
Lymphocytes Relative: 22 % (ref 12–46)
Lymphs Abs: 1.5 10*3/uL (ref 0.7–4.0)
MCH: 28.5 pg (ref 26.0–34.0)
MCHC: 34.7 g/dL (ref 30.0–36.0)
MCV: 82.3 fL (ref 78.0–100.0)
MONO ABS: 0.7 10*3/uL (ref 0.1–1.0)
Monocytes Relative: 11 % (ref 3–12)
NEUTROS PCT: 65 % (ref 43–77)
Neutro Abs: 4.2 10*3/uL (ref 1.7–7.7)
PLATELETS: 356 10*3/uL (ref 150–400)
RBC: 4.17 MIL/uL (ref 3.87–5.11)
RDW: 16.8 % — ABNORMAL HIGH (ref 11.5–15.5)
WBC: 6.6 10*3/uL (ref 4.0–10.5)

## 2013-11-21 LAB — COMPREHENSIVE METABOLIC PANEL
ALT: 13 U/L (ref 0–35)
ANION GAP: 14 (ref 5–15)
AST: 25 U/L (ref 0–37)
Albumin: 3.6 g/dL (ref 3.5–5.2)
Alkaline Phosphatase: 59 U/L (ref 39–117)
BILIRUBIN TOTAL: 0.3 mg/dL (ref 0.3–1.2)
BUN: 12 mg/dL (ref 6–23)
CALCIUM: 9.5 mg/dL (ref 8.4–10.5)
CHLORIDE: 89 meq/L — AB (ref 96–112)
CO2: 24 meq/L (ref 19–32)
Creatinine, Ser: 0.92 mg/dL (ref 0.50–1.10)
GFR calc Af Amer: 67 mL/min — ABNORMAL LOW (ref >90)
GFR calc non Af Amer: 58 mL/min — ABNORMAL LOW (ref >90)
Glucose, Bld: 82 mg/dL (ref 70–99)
Potassium: 4 mEq/L (ref 3.7–5.3)
Sodium: 127 mEq/L — ABNORMAL LOW (ref 137–147)
Total Protein: 7.3 g/dL (ref 6.0–8.3)

## 2013-11-21 NOTE — Discharge Summary (Signed)
Physician Discharge Summary  Patient ID: Brianna [name] MRN: [id number] DOB/AGE: Dec 30, 1932 80 y.o.  Admit date: 11/18/2013 Discharge date: 11/21/2013  Admission Diagnoses: Hyponatremia Primary polydipsia  Discharge Diagnoses:  Principal Problem:   Hyponatremia Active Problems:   Hypertension   Dementia   Weakness   Hypothyroid   Anemia   Diarrhea   Discharged Condition: stable  Hospital Course: Brianna [name] is a 80 yo female with progressive and fairly advanced dementia.  She gets excellent care at home with her family. However, she has had recurrent issues with severe hyponatremia which responds to fluid restriction and IV NS.  This happened again this admission.  The family reports a tremendous amount of daily oral fluid intake as a routine for Brianna [name].  In house she was stabilized with free water restriction and IV normal saline and sodium gradually increased from 110 to 127 (which is basically her baseline). The daughter felt she was at her baseline so she will be discharged home.  Consults: None  Significant Diagnostic Studies: none  Treatments: IV hydration  Discharge Exam:  Blood pressure 134/53, pulse 81, temperature 98.8 F (37.1 C), temperature source Oral, resp. rate 20, height 5' (1.524 m), weight 59.8 kg (131 lb 13.4 oz), SpO2 96.00%.  Physical Exam: sitting in chair eating lunch. Nad. Oriented to person only. Lungs cta bilat. No w/r/r. Heart is rrr no m/r/g abd soft, nt. No mass or hsm  Disposition:discharge to home.     Medication List    ASK your doctor about these medications       galantamine 16 MG 24 hr capsule  Commonly known as:  RAZADYNE ER  Take 1 capsule (16 mg total) by mouth daily with breakfast.     levothyroxine 100 MCG tablet  Commonly known as:  SYNTHROID, LEVOTHROID  Take 100 mcg by mouth daily before breakfast.     NEXIUM 40 MG capsule  Generic drug:  esomeprazole  Take 40 mg by mouth daily.     nisoldipine 17 MG 24 hr tablet   Commonly known as:  SULAR  Take 17 mg by mouth daily.     NU-IRON 150 MG capsule  Generic drug:  iron polysaccharides  Take 150 mg by mouth daily.     PRISTIQ 50 MG 24 hr tablet  Generic drug:  desvenlafaxine  Take 50 mg by mouth daily.     traZODone 50 MG tablet  Commonly known as:  DESYREL  Take 25 mg by mouth at bedtime.     valsartan 320 MG tablet  Commonly known as:  DIOVAN  Take 320 mg by mouth daily.     Vitamin D (Ergocalciferol) 50000 UNITS Caps capsule  Commonly known as:  DRISDOL  Take 50,000 Units by mouth every 7 (seven) days. Tuesday       she should be fluid restricted to no more than 1200 to 1500 cc fluid daily Needs follow up visit in our office in about one week. She needs repeat cbc-diff, cmet in our office in 2-3 days (slip given).  SignedCrist Infante A 11/21/2013, 1:51 PM

## 2013-11-21 NOTE — Progress Notes (Signed)
Medicare Important Message given? YES  (If response is "NO", the following Medicare IM given date fields will be blank)  Date Medicare IM given:  11/21/2013 Medicare IM given by: Jonnie Finner

## 2013-11-24 DIAGNOSIS — D509 Iron deficiency anemia, unspecified: Secondary | ICD-10-CM | POA: Diagnosis not present

## 2013-11-24 DIAGNOSIS — E871 Hypo-osmolality and hyponatremia: Secondary | ICD-10-CM | POA: Diagnosis not present

## 2013-11-26 DIAGNOSIS — D508 Other iron deficiency anemias: Secondary | ICD-10-CM | POA: Diagnosis not present

## 2013-11-26 DIAGNOSIS — D126 Benign neoplasm of colon, unspecified: Secondary | ICD-10-CM | POA: Diagnosis not present

## 2013-11-29 ENCOUNTER — Encounter (HOSPITAL_COMMUNITY): Payer: Self-pay | Admitting: Emergency Medicine

## 2013-11-29 DIAGNOSIS — G309 Alzheimer's disease, unspecified: Secondary | ICD-10-CM | POA: Diagnosis not present

## 2013-11-29 DIAGNOSIS — E871 Hypo-osmolality and hyponatremia: Secondary | ICD-10-CM | POA: Diagnosis not present

## 2013-11-29 DIAGNOSIS — Z6824 Body mass index (BMI) 24.0-24.9, adult: Secondary | ICD-10-CM | POA: Diagnosis not present

## 2013-11-29 DIAGNOSIS — R269 Unspecified abnormalities of gait and mobility: Secondary | ICD-10-CM | POA: Diagnosis not present

## 2013-12-07 DIAGNOSIS — E871 Hypo-osmolality and hyponatremia: Secondary | ICD-10-CM | POA: Diagnosis not present

## 2013-12-14 DIAGNOSIS — E871 Hypo-osmolality and hyponatremia: Secondary | ICD-10-CM | POA: Diagnosis not present

## 2013-12-22 DIAGNOSIS — E871 Hypo-osmolality and hyponatremia: Secondary | ICD-10-CM | POA: Diagnosis not present

## 2013-12-28 ENCOUNTER — Encounter: Payer: Self-pay | Admitting: Physical Therapy

## 2013-12-28 ENCOUNTER — Ambulatory Visit: Payer: Medicare Other | Attending: Endocrinology | Admitting: Physical Therapy

## 2013-12-28 DIAGNOSIS — R269 Unspecified abnormalities of gait and mobility: Secondary | ICD-10-CM

## 2013-12-28 DIAGNOSIS — E871 Hypo-osmolality and hyponatremia: Secondary | ICD-10-CM | POA: Diagnosis not present

## 2013-12-28 DIAGNOSIS — Z9181 History of falling: Secondary | ICD-10-CM | POA: Diagnosis not present

## 2013-12-28 DIAGNOSIS — R279 Unspecified lack of coordination: Secondary | ICD-10-CM | POA: Insufficient documentation

## 2013-12-28 NOTE — Therapy (Signed)
Physical Therapy Evaluation  Patient Details  Name: Brianna [name] MRN: [id number] Date of Birth: Aug 09, 1932  Encounter Date: 12/28/2013      PT End of Session - 12/28/13 1529    Visit Number 1   Number of Visits 17   Date for PT Re-Evaluation 02/26/14   PT Start Time 1403   PT Stop Time 1449   PT Time Calculation (min) 46 min   Equipment Utilized During Treatment Gait belt   Activity Tolerance Patient tolerated treatment well   Behavior During Therapy [facility] for tasks assessed/performed      Past Medical History  Diagnosis Date  . DUB (dysfunctional uterine bleeding)   . Thyroid disease     Hypothyroid  . Hypertension   . Dementia   . Elevated cholesterol   . Kidney stone   . Hyperlipidemia   . Depression with anxiety   . Sleep apnea   . Adenomatous colon polyp     Past Surgical History  Procedure Laterality Date  . Cholecystectomy    . Kidney stones    . Colon tumor-benign    . Abdominal hysterectomy    . Appendectomy      There were no vitals taken for this visit.  Visit Diagnosis:  Abnormality of gait  Lack of coordination  Risk for falls      Subjective Assessment - 12/28/13 1409    Symptoms No pain, no current complaints.   Patient Stated Goals Increase strength and balance to decrease fall risk   Currently in Pain? No/denies          [facility] PT Assessment - 12/28/13 1400    Assessment   Medical Diagnosis Alzheimers disease   Onset Date 12/09/13   Precautions   Precautions Fall   Precaution Comments patient has recent history of falls and recent diagnosis of Alzheimer's Disease with 8 year history of cognitive impairment   Balance Screen   Has the patient fallen in the past 6 months Yes   How many times? 2   Has the patient had a decrease in activity level because of a fear of falling?  No  pt daughter is fearfull of her falling in home and community   Is the patient reluctant to leave their home because of a fear of falling?  No   Home  Environment   Living Enviornment Private residence   Living Arrangements Spouse/significant other   Available Help at Discharge Available PRN/intermittently  Daughter lives in house next door to mother/father   Type of [place] to enter  1 step to front porch; 1 step to enter front Media planner of Steps 2 single steps   [place] One level   [place] seat   Prior Function   Level of [place] with gait  household gait with no assistive device   Cognition   Overall Cognitive Status History of cognitive impairments - at baseline   Memory Impaired  could not remember how many children she had   Behaviors --  confused, cooperative, appropriate   Observation/Other Assessments   Focus on Therapeutic Outcomes (FOTO)  Physical FS Primary Measure (51)   Posture/Postural Control   Posture/Postural Control Postural limitations   Postural Limitations Rounded Shoulders;Forward head;Decreased lumbar lordosis;Posterior pelvic tilt;Flexed trunk   Strength   Overall Strength Deficits   Overall Strength Comments 4+/5 bilateral hip flexion (sitting); 4/5 hip abduction in sidelying   Bed Mobility   Bed  Mobility Supine to Sit;Sit to Supine;Rolling Right;Rolling Left   Rolling Right 6: Modified independent (Device/Increase time)   Rolling Left 6: Modified independent (Device/Increase time)   Supine to Sit 6: Modified independent (Device/Increase time)   Sit to Supine 6: Modified independent (Device/Increase time)   Transfers   Transfers Sit to Stand;Stand to Sit;Stand Pivot Transfers   Sit to Stand 5: Supervision   Stand to Sit 5: Supervision   Stand Pivot Transfers 5: Supervision   Ambulation/Gait   Ambulation/Gait Yes   Ambulation/Gait Assistance 4: Min assist;4: Min guard   Ambulation/Gait Assistance Details ambulates with min guard with tactile cues to provide feedback to initiate balance adjustment   Ambulation Distance  (Feet) 100 Feet   Assistive device None   Gait Pattern Step-through pattern;Trunk flexed;Narrow base of support  variable foot placement, unsteady gait   Gait velocity 2.27 ft/sec   Standardized Balance Assessment   Standardized Balance Assessment Berg Balance Test;Timed Up and Go Test   Berg Balance Test   Sit to Stand Able to stand without using hands and stabilize independently   Standing Unsupported Able to stand 2 minutes with supervision   Sitting with Back Unsupported but Feet Supported on Floor or Stool Able to sit safely and securely 2 minutes   Stand to Sit Sits safely with minimal use of hands   Transfers Able to transfer safely, minor use of hands   Standing Unsupported with Eyes Closed Able to stand 10 seconds with supervision   Standing Ubsupported with Feet Together Needs help to attain position but able to stand for 30 seconds with feet together   From Standing, Reach Forward with Outstretched Arm Can reach forward >12 cm safely (5")   From Standing Position, Pick up Object from Floor Able to pick up shoe, needs supervision   From Standing Position, Turn to Look Behind Over each Shoulder Looks behind one side only/other side shows less weight shift   Turn 360 Degrees Needs close supervision or verbal cueing   Standing Unsupported, Alternately Place Feet on Step/Stool Able to complete >2 steps/needs minimal assist   Standing Unsupported, One Foot in Front Able to take small step independently and hold 30 seconds   Standing on One Leg Tries to lift leg/unable to hold 3 seconds but remains standing independently   Total Score 37   Timed Up and Go Test   Normal TUG (seconds) 14.25   Manual TUG (seconds) 17.05   Cognitive TUG (seconds) 27.84   TUG Comments safe during turns but takes extra time; carried cup with 2 hands during manual TUG; only verbalized 6 colors during cognitive TUG            PT Education - 12/28/13 1529    Education provided Yes   Education Details  Plan of care direction   Person(s) Educated Patient;Child(ren)   Methods Explanation   Comprehension Verbalized understanding          PT Short Term Goals - 12/28/13 1538    PT SHORT TERM GOAL #1   Title Patient will perform HEP with minimal cueing for accuracy from caregiver (01/25/14)   Time 4   Period Weeks   Status New   PT SHORT TERM GOAL #2   Title Patient caregiver will verbalize and return demonstrate proper guarding of patient when negotiating ramp/curb/stairs (01/25/14)   Time 4   Period Weeks   Status New   PT SHORT TERM GOAL #3   Title Patient will transfer floor to chair  with minimal assistance from family member with minimal verbal cues from PT for sequencing (01/25/14)   Time 4   Period Weeks   PT SHORT TERM GOAL #4   Title Patient will ambulate safety at gait speed >2.50 ft/sec with supervision from PT (01/25/14)   Time 4   Period Weeks   Status New   PT SHORT TERM GOAL #5   Title Patient will improve BERG balance score to 42/56 (01/25/14)   Time 4   Period Weeks   Status New          PT Long Term Goals - 12/28/13 1546    PT LONG TERM GOAL #1   Title Patient will perform HEP with no verbal cues from PT under supervision of caregiver (02/22/14)   Time 8   Period Weeks   Status New   PT LONG TERM GOAL #2   Title Patient will improve BERG balance score to >/= 48/56 (02/22/14)   Time 8   Period Weeks   Status New   PT LONG TERM GOAL #3   Title Patient will improve gait speed to >/= 2.82 ft/sec with supervision from PT (02/22/14)   Time 8   Period Weeks   Status New   PT LONG TERM GOAL #4   Title Patient will have FOTO score increase >/= 10 points (02/22/14)   Time 8   Period Weeks   Status New   PT LONG TERM GOAL #5   Title Patient will perform TUG with a time </= 13.5 seconds under supervision of PT (02/22/14)   Time 8   Period Weeks   Status New          Plan - 12/28/13 1530    Clinical Impression Statement The patient has good overall  strength in bilateral LE but has significant deficits in balance and ambulation with scores indicating she is at increased rall risk.   Pt will benefit from skilled therapeutic intervention in order to improve on the following deficits Abnormal gait;Difficulty walking;Decreased coordination;Decreased endurance;Decreased safety awareness;Decreased activity tolerance;Decreased balance;Decreased cognition;Decreased mobility;Decreased strength   Rehab Potential Good   PT Frequency 2x / week   PT Duration 8 weeks   PT Treatment/Interventions ADLs/Self Care Home Management;Therapeutic activities;Patient/family education;Therapeutic exercise;Gait training;Balance training;Energy conservation;Neuromuscular re-education;Stair training;Functional mobility training   PT Next Visit Plan Initiate home exercise plan (counter exercises; corner balance exercises), provide print-out, provide caregiver education on exercises   Consulted and Agree with Plan of Care Patient;Family member/caregiver   Family Member Consulted Daughter        Problem List Patient Active Problem List   Diagnosis Date Noted  . Anemia 10/26/2013  . Diarrhea 10/26/2013  . Near syncope 09/06/2013  . Hypothyroid 09/06/2013  . Hyponatremia 02/13/2013  . Weakness 02/13/2013  . 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 Hypersomnia with sleep apnea, unspecified 11/17/2012  . DUB (dysfunctional uterine bleeding)   . Hypertension   . Dementia   . Elevated cholesterol   . Kidney stone      All PT delivered under supervision of Licensed Physical Therapist.    Blima Rich, Student PT 12/28/2013, 3:52 PM      See edited evaluation note by Jamey Reas, PT, DPT  This entire session was performed under direct supervision and direction of a licensed therapist.  Jamey Reas, PT, DPT PT Specializing in [place] 12/29/2013 1:20 PM Phone:  [phone]  Fax:   [phone] [address] [place], [place] [address]

## 2013-12-29 ENCOUNTER — Encounter: Payer: Self-pay | Admitting: Physical Therapy

## 2013-12-29 NOTE — Therapy (Signed)
[facility] [address] [place], [place], [address] Phone: [phone]   Fax:  [phone]  Physical Therapy Evaluation  Patient Details  Name: Brianna [name] MRN: [id number] Date of Birth: 1932-11-28  Encounter Date: 12/28/2013      PT End of Session - 12/28/13 1529    Visit Number 1   Number of Visits 17   Date for PT Re-Evaluation 02/26/14   PT Start Time 1403   PT Stop Time 1449   PT Time Calculation (min) 46 min   Equipment Utilized During Treatment Gait belt   Activity Tolerance Patient tolerated treatment well   Behavior During Therapy [facility] for tasks assessed/performed      Past Medical History  Diagnosis Date  . DUB (dysfunctional uterine bleeding)   . Thyroid disease     Hypothyroid  . Hypertension   . Dementia   . Elevated cholesterol   . Kidney stone   . Hyperlipidemia   . Depression with anxiety   . Sleep apnea   . Adenomatous colon polyp     Past Surgical History  Procedure Laterality Date  . Cholecystectomy    . Kidney stones    . Colon tumor-benign    . Abdominal hysterectomy    . Appendectomy      There were no vitals taken for this visit.  Visit Diagnosis:  Abnormality of gait  Lack of coordination  Risk for falls      Subjective Assessment - 12/28/13 1409    Symptoms No pain, no current complaints. Patient presents with her daughter for PT evaluation due to decline in her mobility with increased falls.   Patient Stated Goals Increase strength and balance to decrease fall risk   Currently in Pain? No/denies          [facility] PT Assessment - 12/28/13 1400    Assessment   Medical Diagnosis Alzheimers disease   Onset Date 12/09/13  MD appt   Precautions   Precautions Fall   Precaution Comments patient has recent history of falls and diagnosis of Alzheimer's Disease with 8 year history of cognitive impairment   Balance Screen   Has the patient fallen in the past 6 months Yes   How  many times? 2   Has the patient had a decrease in activity level because of a fear of falling?  No  pt daughter is fearfull of her falling in home and community   Is the patient reluctant to leave their home because of a fear of falling?  No   Home Environment   Living Enviornment Private residence   Living Arrangements Spouse/significant other   Available Help at Discharge Available PRN/intermittently  Daughter lives in house next door to mother/father   Type of Home House   Home Access Stairs to enter  1 step to front porch; 1 step to enter front IT sales professional of Steps 2 single steps   Home Layout One level   Home Equipment Shower seat   Prior Function   Level of [place] Independent with gait  household gait with no assistive device   Cognition   Overall Cognitive Status History of cognitive impairments - at baseline   Focused Attention Impaired   Focused Attention Impairment Verbal basic;Functional basic   Memory Impaired  could not remember how many children she had   Memory Impairment Storage deficit;Retrieval deficit;Decreased recall of new information;Decreased short term memory;Decreased long term memory   Problem Solving Impaired   Problem  Solving Impairment Verbal basic;Functional basic   Behaviors --  confused, cooperative, appropriate   Observation/Other Assessments   Focus on Therapeutic Outcomes (FOTO)  51  Functional Status measure, dtr completed survey   Activities of Balance Confidence Scale (ABC Scale)  15.0%   Posture/Postural Control   Posture/Postural Control Postural limitations   Postural Limitations Rounded Shoulders;Forward head;Decreased lumbar lordosis;Posterior pelvic tilt;Flexed trunk   Strength   Overall Strength Deficits   Overall Strength Comments seated LE grossly 4/5, sidelying hip abd & ext <4/5  difficult to assess due to cognition   Bed Mobility   Bed Mobility Supine to Sit;Sit to Supine;Rolling Right;Rolling Left    Rolling Right 6: Modified independent (Device/Increase time)   Rolling Left 6: Modified independent (Device/Increase time)   Supine to Sit 6: Modified independent (Device/Increase time)   Sit to Supine 6: Modified independent (Device/Increase time)   Transfers   Transfers Sit to Stand;Stand to Sit;Stand Pivot Transfers   Sit to Stand 5: Supervision;With upper extremity assist;With armrests   Stand to Sit 5: Supervision;With upper extremity assist;With armrests   Stand Pivot Transfers 5: Supervision;With armrests   Ambulation/Gait   Ambulation/Gait Yes   Ambulation/Gait Assistance 4: Min assist   Ambulation/Gait Assistance Details ambulates with min assist with tactile cues to provide feedback to initiate balance adjustment   Ambulation Distance (Feet) 100 Feet   Assistive device None   Gait Pattern Step-through pattern;Trunk flexed;Narrow base of support  variable foot placement, unsteady gait   Gait velocity 2.27 ft/sec   Standardized Balance Assessment   Standardized Balance Assessment Berg Balance Test;Timed Up and Go Test   Berg Balance Test   Sit to Stand Able to stand  independently using hands   Standing Unsupported Able to stand 2 minutes with supervision   Sitting with Back Unsupported but Feet Supported on Floor or Stool Able to sit safely and securely 2 minutes   Stand to Sit Controls descent by using hands   Transfers Able to transfer safely, definite need of hands   Standing Unsupported with Eyes Closed Able to stand 10 seconds with supervision   Standing Ubsupported with Feet Together Needs help to attain position but able to stand for 30 seconds with feet together   From Standing, Reach Forward with Outstretched Arm Reaches forward but needs supervision   From Standing Position, Pick up Object from Floor Able to pick up shoe, needs supervision   From Standing Position, Turn to Look Behind Over each Shoulder Needs supervision when turning   Turn 360 Degrees Needs close  supervision or verbal cueing   Standing Unsupported, Alternately Place Feet on Step/Stool Able to complete >2 steps/needs minimal assist   Standing Unsupported, One Foot in Front Able to take small step independently and hold 30 seconds   Standing on One Leg Tries to lift leg/unable to hold 3 seconds but remains standing independently   Total Score 30   Timed Up and Go Test   Normal TUG (seconds) 14.25   Manual TUG (seconds) 17.05   Cognitive TUG (seconds) 27.84  simple naming tasks 5 colors then repeated & could not conti   TUG Comments --            PT Education - 12/28/13 1529    Education provided Yes   Education Details Plan of care direction   Person(s) Educated Patient;Child(ren)   Methods Explanation   Comprehension Verbalized understanding          PT Short Term Goals - 12/28/13  1538    PT SHORT TERM GOAL #1   Title Patient will perform HEP with cueing for accuracy from caregiver (Target Date: 01/27/14)   Time 4   Period Weeks   Status New   PT SHORT TERM GOAL #2   Title Patient caregiver will verbalize and return demonstrate proper guarding of patient when negotiating ramp/curb/stairs (Target Date: 01/27/14)   Time 4   Period Weeks   Status New   PT SHORT TERM GOAL #3   Title Patient will transfer floor to chair with assistance from family member with verbal cues from PT (Target Date: 01/27/14)   Time 4   Period Weeks   Status New   PT SHORT TERM GOAL #4   Title Patient will turn to look over shoulders without balance loss. (Target Date: 01/27/14)   Time 4   Period Weeks   Status New   PT SHORT TERM GOAL #5   Title --   Time --   Period --   Status --          PT Long Term Goals - 12/28/13 1546    PT LONG TERM GOAL #1   Title Patient will perform HEP with cues / supervision of caregiver (Target Date: 02/25/14)   Time 8   Period Weeks   Status New   PT LONG TERM GOAL #2   Title Patient will improve BERG balance score to > 38/56 (Target Date:  02/25/14)   Time 8   Period Weeks   Status New   PT LONG TERM GOAL #3   Title Patient ambulates >300' on various terrain, negotiates ramp/curb/stairs (1 rail) with daughter's supervision /assistance safely. (Target Date: 02/25/14)   Time 8   Period Weeks   Status New   PT LONG TERM GOAL #4   Title Patient will have FOTO ABC score increase >/= 5 points (Target Date: 02/25/14)   Time 8   Period Weeks   Status New   PT LONG TERM GOAL #5   Title Patient will perform standard TUG with a time </= 13.5 seconds under supervision. (Target Date: 02/25/14)   Time 8   Period Weeks   Status New          Plan - 12/28/13 1530    Clinical Impression Statement This 80yo female with Alzheimer's is still able to live at home with her husband with daughter providing care >18hours /day. Daughter reports that she only goes home next door to sleep a few hours each night. Her daughter requested PT to enable mobility to continue her ability to care for her mother in the home. Brianna [name] has strength, balance & gait deficits that can be addressed with skilled PT with daughter's assistance to provide carryover to home.   Pt will benefit from skilled therapeutic intervention in order to improve on the following deficits Abnormal gait;Difficulty walking;Decreased coordination;Decreased endurance;Decreased safety awareness;Decreased activity tolerance;Decreased balance;Decreased cognition;Decreased mobility;Decreased strength;Decreased knowledge of use of DME   Rehab Potential Good  with strong support of daughter   PT Frequency 2x / week   PT Duration 8 weeks   PT Treatment/Interventions ADLs/Self Care Home Management;Therapeutic activities;Patient/family education;Therapeutic exercise;Gait training;Balance training;Energy conservation;Neuromuscular re-education;Stair training;Functional mobility training;DME Instruction   PT Next Visit Plan Initiate home exercise plan (counter exercises; corner balance exercises),  provide print-out, provide caregiver education on exercises   Consulted and Agree with Plan of Care Patient;Family member/caregiver   Family Member Consulted Daughter  G-Codes - 12/29/13 1316    Functional Assessment Tool Used Timed Up & Go: Standard 14.25sec, Manual 17.05sec, cognitive 27.84sec   Functional Limitation Mobility: Walking and moving around   Mobility: Walking and Moving Around Current Status (G4010) At least 40 percent but less than 60 percent impaired, limited or restricted   Mobility: Walking and Moving Around Goal Status [phone]) At least 20 percent but less than 40 percent impaired, limited or restricted          Problem List Patient Active Problem List   Diagnosis Date Noted  . Anemia 10/26/2013  . Diarrhea 10/26/2013  . Near syncope 09/06/2013  . Hypothyroid 09/06/2013  . Hyponatremia 02/13/2013  . Weakness 02/13/2013  . 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 Hypersomnia with sleep apnea, unspecified 11/17/2012  . DUB (dysfunctional uterine bleeding)   . Hypertension   . Dementia   . Elevated cholesterol   . Kidney stone     Hendy Brindle 12/29/2013, 1:18 PM    This entire session was performed under direct supervision and direction of a licensed therapist/therapist assistant . I have personally read, edited and approve of the note as written.  Blanche East, Student PT  Vladimir Faster, PT, DPT PT Specializing in Prosthetics & Orthotics 12/29/2013 1:19 PM Phone:  [phone]  Fax:  [phone] [facility] [address] [place], [place] [address]

## 2013-12-30 ENCOUNTER — Encounter: Payer: Self-pay | Admitting: Physical Therapy

## 2013-12-30 ENCOUNTER — Ambulatory Visit: Payer: Medicare Other | Admitting: Physical Therapy

## 2013-12-30 DIAGNOSIS — Z9181 History of falling: Secondary | ICD-10-CM

## 2013-12-30 DIAGNOSIS — R279 Unspecified lack of coordination: Secondary | ICD-10-CM

## 2013-12-30 DIAGNOSIS — R269 Unspecified abnormalities of gait and mobility: Secondary | ICD-10-CM

## 2013-12-30 NOTE — Therapy (Signed)
[facility] [address] [place], [place], [address] Phone: [phone]   Fax:  [phone]  Physical Therapy Treatment  Patient Details  Name: Brianna [name] MRN: [id number] Date of Birth: [birth year]  Encounter Date: 12/30/2013      PT End of Session - 12/30/13 1452    Visit Number 2   Number of Visits 17   Date for PT Re-Evaluation 02/26/14   PT Start Time 1404   PT Stop Time 1446   PT Time Calculation (min) 42 min   Equipment Utilized During Treatment Gait belt   Activity Tolerance Patient tolerated treatment well   Behavior During Therapy [facility] for tasks assessed/performed      Past Medical History  Diagnosis Date  . DUB (dysfunctional uterine bleeding)   . Thyroid disease     Hypothyroid  . Hypertension   . Dementia   . Elevated cholesterol   . Kidney stone   . Hyperlipidemia   . Depression with anxiety   . Sleep apnea   . Adenomatous colon polyp     Past Surgical History  Procedure Laterality Date  . Cholecystectomy    . Kidney stones    . Colon tumor-benign    . Abdominal hysterectomy    . Appendectomy      There were no vitals taken for this visit.  Visit Diagnosis:  Abnormality of gait  Lack of coordination  Risk for falls      Subjective Assessment - 12/30/13 1410    Symptoms No changes since Eval, no falls.    Currently in Pain? No/denies       Patient ambulates with decreased gait speed and variable gait pattern requiring hand held assist for ensured safety while ambulating in PT. Pt daughter is caregiver and very helpful with cueing and guarding of pt inside and outside of PT. Pt is limited by cognitive impairment (pleasantly confused) but cooperative with all exercises. Patient performs sit<>stand with supervision, and all exercises with verbal and tactile cues to increase accuracy of performance.         PT Education - 12/30/13 1452    Education provided Yes   Education Details  Caregiver training on patient guarding techniques; HEP initiated (OTAGO)   Person(s) Educated Patient;Child(ren)   Methods Explanation;Demonstration   Comprehension Verbalized understanding;Returned demonstration;Need further instruction          PT Short Term Goals - 12/30/13 1400    PT SHORT TERM GOAL #1   Title Patient will perform HEP with cueing for accuracy from caregiver (Target Date: 01/27/14)   Time 4   Period Weeks   Status On-going   PT SHORT TERM GOAL #2   Title Patient caregiver will verbalize and return demonstrate proper guarding of patient when negotiating ramp/curb/stairs (Target Date: 01/27/14)   Time 4   Period Weeks   Status On-going   PT SHORT TERM GOAL #3   Title Patient will transfer floor to chair with assistance from family member with verbal cues from PT (Target Date: 01/27/14)   Time 4   Period Weeks   Status On-going   PT SHORT TERM GOAL #4   Title Patient will turn to look over shoulders without balance loss. (Target Date: 01/27/14)   Time 4   Period Weeks   Status On-going          PT Long Term Goals - 12/30/13 1400    PT LONG TERM GOAL #1   Title Patient will perform HEP with cues /  supervision of caregiver (Target Date: 02/25/14)   Time 8   Period Weeks   Status On-going   PT LONG TERM GOAL #2   Title Patient will improve BERG balance score to > 38/56 (Target Date: 02/25/14)   Time 8   Period Weeks   Status On-going   PT LONG TERM GOAL #3   Title Patient ambulates >300' on various terrain, negotiates ramp/curb/stairs (1 rail) with daughter's supervision /assistance safely. (Target Date: 02/25/14)   Time 8   Period Weeks   Status On-going   PT LONG TERM GOAL #4   Title Patient will have FOTO ABC score increase >/= 5 points (Target Date: 02/25/14)   Time 8   Period Weeks   Status On-going   PT LONG TERM GOAL #5   Title Patient will perform standard TUG with a time </= 13.5 seconds under supervision. (Target Date: 02/25/14)   Time 8    Period Weeks   Status On-going          Plan - 12/30/13 1453    Clinical Impression Statement Patient performed well with all OTAGO exercises requiring BUE support for most standing exercises for safety. Pt caregiver (daughter) taught proper guarding techniques when patient performing HE{P.    Pt will benefit from skilled therapeutic intervention in order to improve on the following deficits Abnormal gait;Difficulty walking;Decreased coordination;Decreased endurance;Decreased safety awareness;Decreased activity tolerance;Decreased balance;Decreased cognition;Decreased mobility;Decreased strength;Decreased knowledge of use of DME   Rehab Potential Good   PT Frequency 2x / week   PT Duration 8 weeks   PT Treatment/Interventions ADLs/Self Care Home Management;Therapeutic activities;Patient/family education;Therapeutic exercise;Gait training;Balance training;Energy conservation;Neuromuscular re-education;Stair training;Functional mobility training;DME Instruction   PT Next Visit Plan Continue OTAGO HEP with family caregiver training.   Consulted and Agree with Plan of Care Patient;Family member/caregiver   Family Member Consulted Daughter              Balance Exercises - 12/30/13 1413    OTAGO PROGRAM   Head Movements Sitting;5 reps   Neck Movements Sitting;5 reps   Back Extension Standing;5 reps   Trunk Movements Standing;5 reps   Ankle Movements Sitting;10 reps   Knee Extensor 10 reps   Knee Flexor 10 reps  standing   Hip ABductor 10 reps  standing   Ankle Plantorflexors 20 reps, support   Ankle Dorsiflexors 20 reps, support   Knee Bends 10 reps, support        Problem List Patient Active Problem List   Diagnosis Date Noted  . Anemia 10/26/2013  . Diarrhea 10/26/2013  . Near syncope 09/06/2013  . Hypothyroid 09/06/2013  . Hyponatremia 02/13/2013  . Weakness 02/13/2013  . 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  Hypersomnia with sleep apnea, unspecified 11/17/2012  . DUB (dysfunctional uterine bleeding)   . Hypertension   . Dementia   . Elevated cholesterol   . Kidney stone     All PT delivered under supervision of Licensed Physical Therapist Assistant   Blima Rich, Student PT 12/30/2013, 2:55 PM   Willow Ora, PTA, [place] [address], [place], [place] [address] [phone] 12/31/2013, 12:04 PM

## 2014-01-05 ENCOUNTER — Ambulatory Visit: Payer: Medicare Other | Admitting: Physical Therapy

## 2014-01-05 ENCOUNTER — Encounter: Payer: Self-pay | Admitting: Physical Therapy

## 2014-01-05 DIAGNOSIS — R269 Unspecified abnormalities of gait and mobility: Secondary | ICD-10-CM | POA: Diagnosis not present

## 2014-01-05 DIAGNOSIS — Z9181 History of falling: Secondary | ICD-10-CM

## 2014-01-05 DIAGNOSIS — R279 Unspecified lack of coordination: Secondary | ICD-10-CM

## 2014-01-06 NOTE — Therapy (Signed)
[facility] [address] [place], [place], [address] Phone: [phone]   Fax:  [phone]  Physical Therapy Treatment  Patient Details  Name: Brianna [name] MRN: [id number] Date of Birth: 04/19/1932  Encounter Date: 01/05/2014      PT End of Session - 01/05/14 1525    Visit Number 3   Number of Visits 17   Date for PT Re-Evaluation 02/26/14   PT Start Time 1447   PT Stop Time 1525   PT Time Calculation (min) 38 min   Equipment Utilized During Treatment Gait belt   Activity Tolerance Patient tolerated treatment well   Behavior During Therapy [facility] for tasks assessed/performed      Past Medical History  Diagnosis Date  . DUB (dysfunctional uterine bleeding)   . Thyroid disease     Hypothyroid  . Hypertension   . Dementia   . Elevated cholesterol   . Kidney stone   . Hyperlipidemia   . Depression with anxiety   . Sleep apnea   . Adenomatous colon polyp     Past Surgical History  Procedure Laterality Date  . Cholecystectomy    . Kidney stones    . Colon tumor-benign    . Abdominal hysterectomy    . Appendectomy      There were no vitals taken for this visit.  Visit Diagnosis:  Abnormality of gait  Lack of coordination  Risk for falls      Subjective Assessment - 01/05/14 1452    Symptoms No changes since last session. Reports no falls or pain. Daughter reports having trouble getting her to do HEP that was issued last visit   Currently in Pain? No/denies            [facility] Adult PT Treatment/Exercise - 01/05/14 1517    Static Standing Balance   Static Standing - Balance Support Right upper extremity supported;During functional activity   Static Standing - Level of Assistance 4: Min assist   Static Standing - Comment/# of Minutes single leg stance activities with 6 inch box: alternating forward taps, alternating cross taps, alternating forward double taps, and cross double taps x10 each with UE HHA.  with feet across foam beam: forward step off and then back off x5 each leg with min HHA.                        PT Education - 01/05/14 1525    Education provided Yes   Education Details Completed OTAGO program for [employer]) Educated Patient;Child(ren)   Methods Explanation;Demonstration;Handout   Comprehension Verbalized understanding;Returned demonstration          PT Short Term Goals - 01/05/14 1535    PT SHORT TERM GOAL #1   Title Patient will perform HEP with cueing for accuracy from caregiver (Target Date: 01/27/14)   Time 4   Period Weeks   Status On-going   PT SHORT TERM GOAL #2   Title Patient caregiver will verbalize and return demonstrate proper guarding of patient when negotiating ramp/curb/stairs (Target Date: 01/27/14)   Time 4   Period Weeks   Status On-going   PT SHORT TERM GOAL #3   Title Patient will transfer floor to chair with assistance from family member with verbal cues from PT (Target Date: 01/27/14)   Time 4   Period Weeks   Status On-going   PT SHORT TERM GOAL #4   Title Patient will turn to look over shoulders  without balance loss. (Target Date: 01/27/14)   Time 4   Period Weeks   Status On-going   PT SHORT TERM GOAL #5   Status On-going          PT Long Term Goals - 01/05/14 1535    PT LONG TERM GOAL #1   Title Patient will perform HEP with cues / supervision of caregiver (Target Date: 02/25/14)   Time 8   Period Weeks   Status On-going   PT LONG TERM GOAL #2   Title Patient will improve BERG balance score to > 38/56 (Target Date: 02/25/14)   Time 8   Period Weeks   Status On-going   PT LONG TERM GOAL #3   Title Patient ambulates >300' on various terrain, negotiates ramp/curb/stairs (1 rail) with daughter's supervision /assistance safely. (Target Date: 02/25/14)   Time 8   Period Weeks   Status On-going   PT LONG TERM GOAL #4   Title Patient will have FOTO ABC score increase >/= 5 points (Target Date: 02/25/14)   Time 8    Period Weeks   Status On-going   PT LONG TERM GOAL #5   Title Patient will perform standard TUG with a time </= 13.5 seconds under supervision. (Target Date: 02/25/14)   Time 8   Period Weeks   Status On-going          Plan - 01/05/14 1525    Clinical Impression Statement Pt/daughter educated on remaining OTAGO ex's appropriate for pt to perform at home. Initiated additional balance training in session. Pt cooperative and agreeable with therapy today, however her daughter reports this is not always the case at home. It depends on the pt's mood. For example it took max encouargement for pt to agree to come to therapy today.                                Pt will benefit from skilled therapeutic intervention in order to improve on the following deficits Abnormal gait;Difficulty walking;Decreased coordination;Decreased endurance;Decreased safety awareness;Decreased activity tolerance;Decreased balance;Decreased cognition;Decreased mobility;Decreased strength;Decreased knowledge of use of DME   Rehab Potential Good   PT Frequency 2x / week   PT Duration 8 weeks   PT Treatment/Interventions ADLs/Self Care Home Management;Therapeutic activities;Patient/family education;Therapeutic exercise;Gait training;Balance training;Energy conservation;Neuromuscular re-education;Stair training;Functional mobility training;DME Instruction   PT Next Visit Plan Continue with gait and balance activites   Consulted and Agree with Plan of Care Patient;Family member/caregiver   Family Member Consulted Daughter             Balance Exercises - 01/05/14 1500    Balance Exercises: Standing   SLS --   OTAGO PROGRAM-Home exercise program   Backwards Walking Support   Sideways Walking --  with UE assist on counter top   Tandem Stance 10 seconds, support   Tandem Walk Support   One Leg Stand 10 seconds, support   Heel Walking Support   Toe Walk Support   Sit to Stand 10 reps, bilateral support           Problem List Patient Active Problem List   Diagnosis Date Noted  . Anemia 10/26/2013  . Diarrhea 10/26/2013  . Near syncope 09/06/2013  . Hypothyroid 09/06/2013  . Hyponatremia 02/13/2013  . Weakness 02/13/2013  . 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 Hypersomnia with sleep apnea, unspecified 11/17/2012  . DUB (dysfunctional uterine bleeding)   .  Hypertension   . Dementia   . Elevated cholesterol   . Kidney stone     Willow Ora 01/06/2014, 1:56 PM  Willow Ora, PTA, [place] [address], [place], [place] [address] [phone] 01/06/2014, 1:56 PM

## 2014-01-07 ENCOUNTER — Ambulatory Visit: Payer: Medicare Other | Admitting: Physical Therapy

## 2014-01-07 ENCOUNTER — Encounter: Payer: Self-pay | Admitting: Physical Therapy

## 2014-01-07 DIAGNOSIS — Z9181 History of falling: Secondary | ICD-10-CM

## 2014-01-07 DIAGNOSIS — R269 Unspecified abnormalities of gait and mobility: Secondary | ICD-10-CM | POA: Diagnosis not present

## 2014-01-07 DIAGNOSIS — R279 Unspecified lack of coordination: Secondary | ICD-10-CM

## 2014-01-07 NOTE — Therapy (Signed)
[facility] [address] [place], [place], [address] Phone: [phone]   Fax:  [phone]  Physical Therapy Treatment  Patient Details  Name: Brianna [name] MRN: [id number] Date of Birth: [birth year]  Encounter Date: 01/07/2014      PT End of Session - 01/07/14 1412    Visit Number 4   Number of Visits 17   Date for PT Re-Evaluation 02/26/14   PT Start Time 6073   PT Stop Time 1400   PT Time Calculation (min) 43 min   Equipment Utilized During Treatment Gait belt   Activity Tolerance Patient tolerated treatment well   Behavior During Therapy [facility] for tasks assessed/performed      Past Medical History  Diagnosis Date  . DUB (dysfunctional uterine bleeding)   . Thyroid disease     Hypothyroid  . Hypertension   . Dementia   . Elevated cholesterol   . Kidney stone   . Hyperlipidemia   . Depression with anxiety   . Sleep apnea   . Adenomatous colon polyp     Past Surgical History  Procedure Laterality Date  . Cholecystectomy    . Kidney stones    . Colon tumor-benign    . Abdominal hysterectomy    . Appendectomy      There were no vitals taken for this visit.  Visit Diagnosis:  Abnormality of gait  Lack of coordination  Risk for falls      Subjective Assessment - 01/07/14 1320    Symptoms No changes since last session. Reports no falls or pain.   Currently in Pain? No/denies            [facility] Adult PT Treatment/Exercise - 01/07/14 1320    Transfers   Sit to Stand 5: Supervision   Sit to Stand Details (indicate cue type and reason) no cues or assist needed, for safety only   Stand to Sit 5: Supervision   Stand to Sit Details no cues or assistance needed, for safety only   Ambulation/Gait   Ambulation/Gait Yes   Ambulation/Gait Assistance 4: Min guard   Ambulation Distance (Feet) 430 Feet  x1   Assistive device 1 person hand held assist   Gait Pattern Step-through pattern;Decreased stride  length;Narrow base of support   Ramp 4: Min assist  HHA, performed x2 reps   Ramp Details (indicate cue type and reason) cues on increased step lenght and to increase foot clearance          Curb 4: Min assist  HHA, performed x2 reps   Curb Details (indicate cue type and reason) cues on technique: to get closer to edge before stepping down and to not step as far forward with first leg with ascending the curb             Static Standing Balance   Static Standing - Balance Support Right upper extremity supported   Static Standing - Level of Assistance 5: Stand by assistance   Static Standing - Comment/# of Minutes single leg stance activities with tall cones: alternating fwd taps, cross taps, fwd double taps, cross double taps, and flip over/up with min HHA.                   High Level Balance   High Level Balance Activities Negotitating around obstacles;Negotiating over obstacles  on both solid floor and blue/red mat (compliant surfaces)   High Level Balance Comments obstacle course composed of hurdles on floor, red mat, and  blue mat combined x4 laps, obstacle course composed of obstacles to walk around (cones) and step over (black blocks) on both floor, red and blue mats x4 laps. Pt needed up to min HHA with obstacle courses and guidance/cues on what to do, where to go.                 Knee/Hip Exercises: Aerobic   Stationary Bike Scifit x4 extremeties L1.0 x 8 minutes with cues on posture and cues to keep going needed at times.                    PT Short Term Goals - 01/07/14 1415    PT SHORT TERM GOAL #1   Title Patient will perform HEP with cueing for accuracy from caregiver (Target Date: 01/27/14)   Time 4   Period Weeks   Status On-going   PT SHORT TERM GOAL #2   Title Patient caregiver will verbalize and return demonstrate proper guarding of patient when negotiating ramp/curb/stairs (Target Date: 01/27/14)   Time 4   Period Weeks   Status On-going   PT SHORT TERM GOAL #3    Title Patient will transfer floor to chair with assistance from family member with verbal cues from PT (Target Date: 01/27/14)   Time 4   Period Weeks   Status On-going   PT SHORT TERM GOAL #4   Title Patient will turn to look over shoulders without balance loss. (Target Date: 01/27/14)   Time 4   Period Weeks   Status On-going   PT SHORT TERM GOAL #5   Status On-going          PT Long Term Goals - 01/07/14 1416    PT LONG TERM GOAL #1   Title Patient will perform HEP with cues / supervision of caregiver (Target Date: 02/25/14)   Time 8   Period Weeks   Status On-going   PT LONG TERM GOAL #2   Title Patient will improve BERG balance score to > 38/56 (Target Date: 02/25/14)   Time 8   Period Weeks   Status On-going   PT LONG TERM GOAL #3   Title Patient ambulates >300' on various terrain, negotiates ramp/curb/stairs (1 rail) with daughter's supervision /assistance safely. (Target Date: 02/25/14)   Time 8   Period Weeks   Status On-going   PT LONG TERM GOAL #4   Title Patient will have FOTO ABC score increase >/= 5 points (Target Date: 02/25/14)   Time 8   Period Weeks   Status On-going   PT LONG TERM GOAL #5   Title Patient will perform standard TUG with a time </= 13.5 seconds under supervision. (Target Date: 02/25/14)   Time 8   Period Weeks   Status On-going          Plan - 01/07/14 1413    Clinical Impression Statement Focued on gait safety and balance activites today, ending with scifit for strengthening and activity tolerance. Pt needs guidance for where and what to do, then performs well. Pt still with decrease motivation and needs encouragement to continue/keep participating, especially with challenging/strenous activity such as scifit and single leg balance activities.                 Pt will benefit from skilled therapeutic intervention in order to improve on the following deficits Abnormal gait;Difficulty walking;Decreased coordination;Decreased  endurance;Decreased safety awareness;Decreased activity tolerance;Decreased balance;Decreased cognition;Decreased mobility;Decreased strength;Decreased knowledge of use of DME   Rehab Potential  Good   PT Frequency 2x / week   PT Duration 8 weeks   PT Treatment/Interventions ADLs/Self Care Home Management;Therapeutic activities;Patient/family education;Therapeutic exercise;Gait training;Balance training;Energy conservation;Neuromuscular re-education;Stair training;Functional mobility training;DME Instruction   PT Next Visit Plan Continue with gait and balance activites   Consulted and Agree with Plan of Care Patient;Family member/caregiver   Family Member Consulted Daughter         Problem List Patient Active Problem List   Diagnosis Date Noted  . Anemia 10/26/2013  . Diarrhea 10/26/2013  . Near syncope 09/06/2013  . Hypothyroid 09/06/2013  . Hyponatremia 02/13/2013  . Weakness 02/13/2013  . 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 Hypersomnia with sleep apnea, unspecified 11/17/2012  . DUB (dysfunctional uterine bleeding)   . Hypertension   . Dementia   . Elevated cholesterol   . Kidney stone     Willow Ora 01/07/2014, 2:17 PM   Willow Ora, PTA, [place] [address] [place], [place] [address] [phone] 01/07/2014, 2:18 PM

## 2014-01-10 ENCOUNTER — Ambulatory Visit: Payer: Medicare Other | Admitting: Physical Therapy

## 2014-01-13 ENCOUNTER — Ambulatory Visit: Payer: Medicare Other | Admitting: Physical Therapy

## 2014-01-27 ENCOUNTER — Other Ambulatory Visit: Payer: Self-pay | Admitting: Neurology

## 2014-01-27 ENCOUNTER — Other Ambulatory Visit: Payer: Self-pay | Admitting: Dermatology

## 2014-01-27 DIAGNOSIS — C44311 Basal cell carcinoma of skin of nose: Secondary | ICD-10-CM | POA: Diagnosis not present

## 2014-01-27 DIAGNOSIS — L821 Other seborrheic keratosis: Secondary | ICD-10-CM | POA: Diagnosis not present

## 2014-01-27 DIAGNOSIS — D485 Neoplasm of uncertain behavior of skin: Secondary | ICD-10-CM | POA: Diagnosis not present

## 2014-01-27 DIAGNOSIS — C44319 Basal cell carcinoma of skin of other parts of face: Secondary | ICD-10-CM | POA: Diagnosis not present

## 2014-01-27 NOTE — Telephone Encounter (Signed)
Patient no showed last appt.  I called, they will check calendar and call us back to reschedule.

## 2014-01-27 NOTE — Telephone Encounter (Signed)
Auth small supply so patient is not without meds over holiday weekend

## 2014-02-04 ENCOUNTER — Emergency Department (HOSPITAL_COMMUNITY): Payer: Medicare Other

## 2014-02-04 ENCOUNTER — Emergency Department (HOSPITAL_COMMUNITY)
Admission: EM | Admit: 2014-02-04 | Discharge: 2014-02-04 | Disposition: A | Discharge status: Home / Self Care | Payer: Medicare Other | Attending: Emergency Medicine | Admitting: Emergency Medicine

## 2014-02-04 ENCOUNTER — Encounter (HOSPITAL_COMMUNITY): Payer: Self-pay | Admitting: Emergency Medicine

## 2014-02-04 DIAGNOSIS — Z87448 Personal history of other diseases of urinary system: Secondary | ICD-10-CM | POA: Insufficient documentation

## 2014-02-04 DIAGNOSIS — Z880 Allergy status to penicillin: Secondary | ICD-10-CM | POA: Diagnosis not present

## 2014-02-04 DIAGNOSIS — Z79899 Other long term (current) drug therapy: Secondary | ICD-10-CM | POA: Insufficient documentation

## 2014-02-04 DIAGNOSIS — F0390 Unspecified dementia without behavioral disturbance: Secondary | ICD-10-CM | POA: Insufficient documentation

## 2014-02-04 DIAGNOSIS — Z87442 Personal history of urinary calculi: Secondary | ICD-10-CM | POA: Insufficient documentation

## 2014-02-04 DIAGNOSIS — R4182 Altered mental status, unspecified: Secondary | ICD-10-CM

## 2014-02-04 DIAGNOSIS — R40241 Glasgow coma scale score 13-15: Secondary | ICD-10-CM | POA: Diagnosis not present

## 2014-02-04 DIAGNOSIS — Z86010 Personal history of colonic polyps: Secondary | ICD-10-CM | POA: Insufficient documentation

## 2014-02-04 DIAGNOSIS — Z8669 Personal history of other diseases of the nervous system and sense organs: Secondary | ICD-10-CM | POA: Diagnosis not present

## 2014-02-04 DIAGNOSIS — E039 Hypothyroidism, unspecified: Secondary | ICD-10-CM | POA: Insufficient documentation

## 2014-02-04 DIAGNOSIS — I10 Essential (primary) hypertension: Secondary | ICD-10-CM | POA: Insufficient documentation

## 2014-02-04 DIAGNOSIS — F418 Other specified anxiety disorders: Secondary | ICD-10-CM | POA: Diagnosis not present

## 2014-02-04 LAB — I-STAT CHEM 8, ED
BUN: 12 mg/dL (ref 6–23)
CALCIUM ION: 1.22 mmol/L (ref 1.13–1.30)
CHLORIDE: 100 meq/L (ref 96–112)
Creatinine, Ser: 1 mg/dL (ref 0.50–1.10)
Glucose, Bld: 118 mg/dL — ABNORMAL HIGH (ref 70–99)
HEMATOCRIT: 37 % (ref 36.0–46.0)
Hemoglobin: 12.6 g/dL (ref 12.0–15.0)
Potassium: 4.4 mmol/L (ref 3.5–5.1)
SODIUM: 135 mmol/L (ref 135–145)
TCO2: 23 mmol/L (ref 0–100)

## 2014-02-04 LAB — URINALYSIS, ROUTINE W REFLEX MICROSCOPIC
Bilirubin Urine: NEGATIVE
Glucose, UA: NEGATIVE mg/dL
HGB URINE DIPSTICK: NEGATIVE
Ketones, ur: NEGATIVE mg/dL
LEUKOCYTES UA: NEGATIVE
NITRITE: NEGATIVE
PH: 5.5 (ref 5.0–8.0)
PROTEIN: NEGATIVE mg/dL
Specific Gravity, Urine: 1.008 (ref 1.005–1.030)
Urobilinogen, UA: 0.2 mg/dL (ref 0.0–1.0)

## 2014-02-04 LAB — COMPREHENSIVE METABOLIC PANEL
ALBUMIN: 4.1 g/dL (ref 3.5–5.2)
ALT: 13 U/L (ref 0–35)
AST: 25 U/L (ref 0–37)
Alkaline Phosphatase: 85 U/L (ref 39–117)
Anion gap: 8 (ref 5–15)
BILIRUBIN TOTAL: 0.6 mg/dL (ref 0.3–1.2)
BUN: 13 mg/dL (ref 6–23)
CO2: 26 mmol/L (ref 19–32)
Calcium: 9.7 mg/dL (ref 8.4–10.5)
Chloride: 101 mEq/L (ref 96–112)
Creatinine, Ser: 0.91 mg/dL (ref 0.50–1.10)
GFR calc non Af Amer: 58 mL/min — ABNORMAL LOW (ref >90)
GFR, EST AFRICAN AMERICAN: 68 mL/min — AB (ref >90)
Glucose, Bld: 117 mg/dL — ABNORMAL HIGH (ref 70–99)
Potassium: 4.4 mmol/L (ref 3.5–5.1)
SODIUM: 135 mmol/L (ref 135–145)
TOTAL PROTEIN: 7.8 g/dL (ref 6.0–8.3)

## 2014-02-04 LAB — I-STAT TROPONIN, ED: TROPONIN I, POC: 0.01 ng/mL (ref 0.00–0.08)

## 2014-02-04 LAB — CBC
HCT: 34.2 % — ABNORMAL LOW (ref 36.0–46.0)
Hemoglobin: 11.2 g/dL — ABNORMAL LOW (ref 12.0–15.0)
MCH: 29.4 pg (ref 26.0–34.0)
MCHC: 32.7 g/dL (ref 30.0–36.0)
MCV: 89.8 fL (ref 78.0–100.0)
Platelets: 311 10*3/uL (ref 150–400)
RBC: 3.81 MIL/uL — AB (ref 3.87–5.11)
RDW: 12.7 % (ref 11.5–15.5)
WBC: 8 10*3/uL (ref 4.0–10.5)

## 2014-02-04 LAB — I-STAT CG4 LACTIC ACID, ED: Lactic Acid, Venous: 1.61 mmol/L (ref 0.5–2.2)

## 2014-02-04 LAB — CBG MONITORING, ED: Glucose-Capillary: 101 mg/dL — ABNORMAL HIGH (ref 70–99)

## 2014-02-04 MED ORDER — SODIUM CHLORIDE 0.9 % IV BOLUS (SEPSIS)
500.0000 mL | Freq: Once | INTRAVENOUS | Status: AC
  Administered 2014-02-04: 500 mL via INTRAVENOUS

## 2014-02-04 NOTE — Discharge Instructions (Signed)
Tests were good.   Follow up your primary care dr or return if worse

## 2014-02-04 NOTE — ED Provider Notes (Signed)
MSE was initiated and I personally evaluated the patient and placed orders (if any) at  2:59 PM on February 04, 2014.  The patient appears stable so that the remainder of the MSE may be completed by another provider.  Patient presents with altered mental status, beginning approximately 2 hours ago.  (2 PM).  Daughter states that she has had confusion in the past with hyponatremia, though not this severe.  She denies recent illnesses, injuries, or change in medications.  Specifically, she denies recent fevers, chills, coughing, abdominal pain, nausea, vomiting or diarrhea.  On physical exam, vital signs are stable, she is GCS 14, in no acute distress.  She has no localizing neuro findings.  Her smile is mildly symmetric.  The daughter says that it appears to be her baseline.    Ernestina Patches, MD 02/04/14 1459

## 2014-02-04 NOTE — ED Provider Notes (Signed)
CSN: [id number]     Arrival date & time 02/04/14  1432 History   First MD Initiated Contact with Patient 02/04/14 1507     Chief Complaint  Patient presents with  . Fever  . Altered Mental Status  . Dementia     (Consider location/radiation/quality/duration/timing/severity/associated sxs/prior Treatment) HPI..... Level V caveat for mild dementia.  Daughter reports some minimal behavioral changes approximate 2 hours ago. She was confused stating her husband was dead Runell Gess is not true].  No fever, sweats, chills, chest pain, dyspnea, dysuria, cough. Patient has had a history of hyponatremia in the past which has responded to fluid restriction. Daughter states now she is approximately baseline.  Past Medical History  Diagnosis Date  . DUB (dysfunctional uterine bleeding)   . Thyroid disease     Hypothyroid  . Hypertension   . Dementia   . Elevated cholesterol   . Kidney stone   . Hyperlipidemia   . Depression with anxiety   . Sleep apnea   . Adenomatous colon polyp    Past Surgical History  Procedure Laterality Date  . Cholecystectomy    . Kidney stones    . Colon tumor-benign    . Abdominal hysterectomy    . Appendectomy     Family History  Problem Relation Age of Onset  . Hypertension Brother   . Heart disease Brother   . Diabetes type II Brother   . Diabetes type II Sister   . Heart disease Father   . Diabetes Mother   . Thyroid disease Child   . Diabetes Child    History  Substance Use Topics  . Smoking status: Never Smoker   . Smokeless tobacco: Never Used  . Alcohol Use: No   OB History    Gravida Para Term Preterm AB TAB SAB Ectopic Multiple Living   3 3 3       3      Review of Systems  Unable to perform ROS: Dementia      Allergies  Penicillins  Home Medications   Prior to Admission medications   Medication Sig Start Date End Date Taking? Authorizing Provider  galantamine (RAZADYNE ER) 16 MG 24 hr capsule TAKE ONE CAPSULE BY MOUTH EVERY DAY  WITH BREAKFAST 01/27/14  Yes Larey Seat, MD  iron polysaccharides (NU-IRON) 150 MG capsule Take 150 mg by mouth daily.  06/02/12  Yes Historical Provider, MD  levothyroxine (SYNTHROID, LEVOTHROID) 100 MCG tablet Take 100 mcg by mouth daily before breakfast.   Yes Historical Provider, MD  NEXIUM 40 MG capsule Take 40 mg by mouth daily.  03/15/13  Yes Historical Provider, MD  nisoldipine (SULAR) 17 MG 24 hr tablet Take 17 mg by mouth daily.  03/13/09  Yes Historical Provider, MD  PRISTIQ 50 MG 24 hr tablet Take 50 mg by mouth daily.  04/05/13  Yes Historical Provider, MD  traZODone (DESYREL) 50 MG tablet Take 25 mg by mouth at bedtime.  02/16/13  Yes Historical Provider, MD  valsartan (DIOVAN) 320 MG tablet Take 320 mg by mouth daily.   Yes Historical Provider, MD  Vitamin D, Ergocalciferol, (DRISDOL) 50000 UNITS CAPS capsule Take 50,000 Units by mouth every 7 (seven) days. Tuesday   Yes Historical Provider, MD   BP 162/63 mmHg  Pulse 76  Temp(Src) 98.2 F (36.8 C) (Rectal)  Resp 16  SpO2 98% Physical Exam  Constitutional: She is oriented to person, place, and time. She appears well-developed and well-nourished.  Pleasant, answers questions appropriately  HENT:  Head: Normocephalic and atraumatic.  Eyes: Conjunctivae and EOM are normal. Pupils are equal, round, and reactive to light.  Neck: Normal range of motion. Neck supple.  Cardiovascular: Normal rate and regular rhythm.   Pulmonary/Chest: Effort normal and breath sounds normal.  Abdominal: Soft. Bowel sounds are normal.  Musculoskeletal: Normal range of motion.  Neurological: She is alert and oriented to person, place, and time.  Alert and oriented 2. Does not know day of the week  Skin: Skin is warm and dry.  Psychiatric: She has a normal mood and affect. Her behavior is normal.  Nursing note and vitals reviewed.   ED Course  Procedures (including critical care time) Labs Review Labs Reviewed  COMPREHENSIVE METABOLIC PANEL -  Abnormal; Notable for the following:    Glucose, Bld 117 (*)    GFR calc non Af Amer 58 (*)    GFR calc Af Amer 68 (*)    All other components within normal limits  CBC - Abnormal; Notable for the following:    RBC 3.81 (*)    Hemoglobin 11.2 (*)    HCT 34.2 (*)    All other components within normal limits  CBG MONITORING, ED - Abnormal; Notable for the following:    Glucose-Capillary 101 (*)    All other components within normal limits  I-STAT CHEM 8, ED - Abnormal; Notable for the following:    Glucose, Bld 118 (*)    All other components within normal limits  URINE CULTURE  URINALYSIS, ROUTINE W REFLEX MICROSCOPIC  CBG MONITORING, ED  I-STAT TROPOININ, ED  I-STAT CHEM 8, ED  I-STAT CG4 LACTIC ACID, ED    Imaging Review Ct Head Wo Contrast  02/04/2014   CLINICAL DATA:  81 year old female with sudden onset altered mental status x2 hrs. Initial encounter.  EXAM: CT HEAD WITHOUT CONTRAST  TECHNIQUE: Contiguous axial images were obtained from the base of the skull through the vertex without intravenous contrast.  COMPARISON:  Head CTs 11/19/2013 and earlier.  FINDINGS: Chronic right ethmoid air cell opacification is unchanged. Other Visualized paranasal sinuses and mastoids are clear. No acute osseous abnormality identified.  No acute scalp soft tissue findings. Visualized orbit soft tissues are within normal limits.  Stable cerebral volume. No ventriculomegaly. No midline shift, mass effect, or evidence of intracranial mass lesion. Stable gray-white matter differentiation, with nonspecific cerebral white matter hypodensity. No evidence of cortically based acute infarction identified. No acute intracranial hemorrhage identified. No suspicious intracranial vascular hyperdensity.  IMPRESSION: No acute intracranial abnormality. Mild to moderate for age nonspecific white matter changes.   Electronically Signed   By: Lars Pinks M.D.   On: 02/04/2014 15:39   Dg Chest Port 1 View  02/04/2014    CLINICAL DATA:  Dementia is worse than normal.  Hypertension.  EXAM: PORTABLE CHEST - 1 VIEW  COMPARISON:  11/18/2013  FINDINGS: The heart size and mediastinal contours are within normal limits. Both lungs are clear. The visualized skeletal structures are unremarkable.  IMPRESSION: No active disease.   Electronically Signed   By: Maryclare Bean M.D.   On: 02/04/2014 15:17     EKG Interpretation None      MDM   Final diagnoses:  Altered mental status, unspecified altered mental status type   Patient appears to be at her baseline. Screening labs, urinalysis, CT head negative for acute changes. Discussed findings with daughter. She is comfortable taking her mother home.    Nat Christen, MD 02/04/14 1924

## 2014-02-04 NOTE — ED Notes (Signed)
Bed: RESB Expected date:  Expected time:  Means of arrival:  Comments: EMS-sepsis 

## 2014-02-04 NOTE — ED Notes (Addendum)
Per EMS- (from home) was angrily sitting in living room saying another man was trying to marry her, saying her husband died two days ago (husband is currently living). Pt reports that it is June, 1956 Salvadore Farber is president. Was seen recently at the PCP and "sodium was low." Has severe posterior left breast rash. Patient is verbally repetitive. Severe confusion starting one hour ago. No grip differences. No stroke symptoms. No other neurological deficits. CBG 115 mg/dl. VS: 156/92 HR 98 regular (12 Lead unremarkable) SpO2 98% on RA Temp 100.8 Tympanic. Ambulatory to truck. Hx dementia. Has had 1000 mg PO Tylenol (1420). No swallowing difficulties noted. 18 G PIV in right AC.

## 2014-02-06 ENCOUNTER — Other Ambulatory Visit: Payer: Self-pay | Admitting: Neurology

## 2014-02-07 ENCOUNTER — Telehealth: Payer: Self-pay | Admitting: Neurology

## 2014-02-07 NOTE — Telephone Encounter (Signed)
Patient's daughter stated Rx refill is needed for galantamine (RAZADYNE ER) 16 MG 24 hr capsule.  Patient is completely out.  Please call and advise.

## 2014-02-07 NOTE — Telephone Encounter (Signed)
Rx has already been sent.  I called back, got no answer.  Left message.

## 2014-02-08 LAB — URINE CULTURE

## 2014-02-16 DIAGNOSIS — I10 Essential (primary) hypertension: Secondary | ICD-10-CM | POA: Diagnosis not present

## 2014-02-16 DIAGNOSIS — D509 Iron deficiency anemia, unspecified: Secondary | ICD-10-CM | POA: Diagnosis not present

## 2014-02-16 DIAGNOSIS — Z6828 Body mass index (BMI) 28.0-28.9, adult: Secondary | ICD-10-CM | POA: Diagnosis not present

## 2014-02-16 DIAGNOSIS — E559 Vitamin D deficiency, unspecified: Secondary | ICD-10-CM | POA: Diagnosis not present

## 2014-02-16 DIAGNOSIS — G309 Alzheimer's disease, unspecified: Secondary | ICD-10-CM | POA: Diagnosis not present

## 2014-02-16 DIAGNOSIS — E039 Hypothyroidism, unspecified: Secondary | ICD-10-CM | POA: Diagnosis not present

## 2014-02-16 DIAGNOSIS — R634 Abnormal weight loss: Secondary | ICD-10-CM | POA: Diagnosis not present

## 2014-02-16 DIAGNOSIS — E785 Hyperlipidemia, unspecified: Secondary | ICD-10-CM | POA: Diagnosis not present

## 2014-02-16 DIAGNOSIS — E871 Hypo-osmolality and hyponatremia: Secondary | ICD-10-CM | POA: Diagnosis not present

## 2014-03-08 DIAGNOSIS — C44319 Basal cell carcinoma of skin of other parts of face: Secondary | ICD-10-CM | POA: Diagnosis not present

## 2014-03-08 DIAGNOSIS — Z85828 Personal history of other malignant neoplasm of skin: Secondary | ICD-10-CM | POA: Diagnosis not present

## 2014-03-15 DIAGNOSIS — Z85828 Personal history of other malignant neoplasm of skin: Secondary | ICD-10-CM | POA: Diagnosis not present

## 2014-03-15 DIAGNOSIS — C44311 Basal cell carcinoma of skin of nose: Secondary | ICD-10-CM | POA: Diagnosis not present

## 2014-03-30 ENCOUNTER — Other Ambulatory Visit: Payer: Self-pay | Admitting: Neurology

## 2014-04-01 ENCOUNTER — Other Ambulatory Visit: Payer: Self-pay | Admitting: Neurology

## 2014-04-05 ENCOUNTER — Ambulatory Visit: Payer: Medicare Other | Admitting: Nurse Practitioner

## 2014-04-08 ENCOUNTER — Encounter: Payer: Self-pay | Admitting: Nurse Practitioner

## 2014-04-08 ENCOUNTER — Ambulatory Visit (INDEPENDENT_AMBULATORY_CARE_PROVIDER_SITE_OTHER): Payer: Medicare Other | Admitting: Nurse Practitioner

## 2014-04-08 VITALS — BP 124/56 | HR 67 | Ht 60.0 in | Wt 152.0 lb

## 2014-04-08 DIAGNOSIS — F0390 Unspecified dementia without behavioral disturbance: Secondary | ICD-10-CM | POA: Diagnosis not present

## 2014-04-08 MED ORDER — GALANTAMINE HYDROBROMIDE ER 16 MG PO CP24: ORAL_CAPSULE | ORAL | Status: DC

## 2014-04-08 NOTE — Patient Instructions (Signed)
Continue Razadyne at current dose will refill Perform physical therapy exercises daily Walk for overall health and well-being Follow-up in 6 months

## 2014-04-08 NOTE — Progress Notes (Signed)
I agree with the assessment and plan as directed by NP .The patient is known to me .   Ammy Lienhard, MD  

## 2014-04-08 NOTE — Progress Notes (Signed)
[facility]  PATIENT: Brianna [name] DOB: [birth year]   REASON FOR VISIT: Follow-up for Alzheimer's disease, tension headaches HISTORY FROM: Daughter    HISTORY OF PRESENT ILLNESS:81 year old caucasian female, who is a long established patient in our practice and carries the diagnosis of dementia, last seen by Dr. Brett [name] 04/22/2013 for tension headaches. MRI at that time showed Mesio-temporal atrophy indicative of memory loss disorder , As in Alzheimer's. small vessel disease has progressed since 2007.  She has been on Excelon and Ambien and could not tolerate it, changed to Galantamine ER at 16 mg and she has not had side effects. Last EEG 2013 was remarkably normal . Brianna [name] is no longer independent with ADL's.She requires some assistance  Her Mini-Mental status exam score is the same from 2014 today 20 out of 30. Her insomnia is better since shewas started on Trazodone with Dr. Forde [name]. She has had several hospital ER visits most recently for hyponatremia. She is now on fluid restriction  She is still not easily motivated to participate in activities. She is very passive, does not exercise.  Appetite good. Sleeps well now. She returns for reevaluation  REVIEW OF SYSTEMS: Full 14 system review of systems performed and notable only for those listed, all others are neg:  Constitutional: neg  Cardiovascular: neg Ear/Nose/Throat: neg  Skin: neg Eyes: neg Respiratory: neg Gastroitestinal: neg  Hematology/Lymphatic: Anemia Endocrine: neg Musculoskeletal:neg Allergy/Immunology: neg Neurological: Memory loss Psychiatric: neg Sleep : neg   ALLERGIES: Allergies  Allergen Reactions  . Penicillins     unknown    HOME MEDICATIONS: Outpatient Prescriptions Prior to Visit  Medication Sig Dispense Refill  . galantamine (RAZADYNE ER) 16 MG 24 hr capsule TAKE ONE CAPSULE BY MOUTH EVERY DAY WITH BREAKFAST 11 capsule 0  . iron polysaccharides  (NU-IRON) 150 MG capsule Take 150 mg by mouth 3 (three) times daily.     Marland Kitchen NEXIUM 40 MG capsule Take 40 mg by mouth daily.     . nisoldipine (SULAR) 17 MG 24 hr tablet Take 17 mg by mouth daily.     Marland Kitchen PRISTIQ 50 MG 24 hr tablet Take 50 mg by mouth daily.     . traZODone (DESYREL) 50 MG tablet Take 25 mg by mouth at bedtime.     . valsartan (DIOVAN) 320 MG tablet Take 320 mg by mouth daily.    . Vitamin D, Ergocalciferol, (DRISDOL) 50000 UNITS CAPS capsule Take 50,000 Units by mouth every 7 (seven) days. Tuesday    . levothyroxine (SYNTHROID, LEVOTHROID) 100 MCG tablet Take 100 mcg by mouth daily before breakfast.     No facility-administered medications prior to visit.    PAST MEDICAL HISTORY: Past Medical History  Diagnosis Date  . DUB (dysfunctional uterine bleeding)   . Thyroid disease     Hypothyroid  . Hypertension   . Dementia   . Elevated cholesterol   . Kidney stone   . Hyperlipidemia   . Depression with anxiety   . Sleep apnea   . Adenomatous colon polyp     PAST SURGICAL HISTORY: Past Surgical History  Procedure Laterality Date  . Cholecystectomy    . Kidney stones    . Colon tumor-benign    . Abdominal hysterectomy    . Appendectomy      FAMILY HISTORY: Family History  Problem Relation Age of Onset  . Hypertension Brother   . Heart disease Brother   . Diabetes type II Brother   . Diabetes  type II Sister   . Heart disease Father   . Diabetes Mother   . Thyroid disease Child   . Diabetes Child     SOCIAL HISTORY: History   Social History  . Marital Status: Married    Spouse Name: Brianna [name]  . Number of Children: 4  . Years of Education: 8   Occupational History  .     Social History Main Topics  . Smoking status: Never Smoker   . Smokeless tobacco: Never Used  . Alcohol Use: No  . Drug Use: No  . Sexual Activity: No   Other Topics Concern  . Not on file   Social History Narrative   Patient is married Brianna [name]).   Patient has four  children.   Patient has an 8th grade education.   Patient is retired.   Patient is right-handed.   Patient drinks 3- 2 liters of Pepsi daily.     PHYSICAL EXAM  Filed Vitals:   04/08/14 1035  BP: 124/56  Pulse: 67  Height: 5' (1.524 m)  Weight: 152 lb (68.947 kg)   Body mass index is 29.69 kg/(m^2).  Generalized: Well developed, in no acute distress  Head: normocephalic and atraumatic,. Oropharynx benign  Neck: Supple, no carotid bruits  Cardiac: Regular rate rhythm, no murmur  Musculoskeletal: No deformity   Neurological examination   Mentation: Alert MMSE 20/30 missing items in orientation calculation 3 of 3 recall and unable to copy a figure . AFT 5.   Follows all commands , little speech and language fluent.   Cranial nerve II-XII: Pupils were equal round reactive to light extraocular movements were full, visual field were full on confrontational test. Facial sensation and strength were normal. hearing was intact to finger rubbing bilaterally. Uvula tongue midline. head turning and shoulder shrug were normal and symmetric.Tongue protrusion into cheek strength was normal. Motor: normal bulk and tone, full strength in the BUE, BLE, fine finger movements normal, no pronator drift. No focal weakness Sensory: normal and symmetric to light touch, pinprick, and  Vibration, proprioception  Coordination: finger-nose-finger, heel-to-shin bilaterally, no dysmetria Reflexes: Brachioradialis 2/2, biceps 2/2, triceps 2/2, patellar 2/2, Achilles 2/2, plantar responses were flexor bilaterally. Gait and Station: Rising up from seated position without assistance, normal stance,  moderate stride, good arm swing, smooth turning, able to perform tiptoe, and heel walking without difficulty. Tandem gait is  Mildly unsteady  DIAGNOSTIC DATA (LABS, IMAGING, TESTING) - I reviewed patient records, labs, notes, testing and imaging myself where available.  Lab Results  Component Value Date   WBC 8.0  02/04/2014   HGB 12.6 02/04/2014   HCT 37.0 02/04/2014   MCV 89.8 02/04/2014   PLT 311 02/04/2014      Component Value Date/Time   NA 135 02/04/2014 1456   NA 131* 04/28/2013 1021   K 4.4 02/04/2014 1456   CL 100 02/04/2014 1456   CO2 26 02/04/2014 1447   GLUCOSE 118* 02/04/2014 1456   GLUCOSE 96 04/28/2013 1021   BUN 12 02/04/2014 1456   BUN 11 04/28/2013 1021   CREATININE 1.00 02/04/2014 1456   CALCIUM 9.7 02/04/2014 1447   PROT 7.8 02/04/2014 1447   PROT 6.8 04/28/2013 1021   ALBUMIN 4.1 02/04/2014 1447   AST 25 02/04/2014 1447   ALT 13 02/04/2014 1447   ALKPHOS 85 02/04/2014 1447   BILITOT 0.6 02/04/2014 1447   GFRNONAA 58* 02/04/2014 1447   GFRAA 68* 02/04/2014 1447    Lab Results  Component Value Date  LOVFIEPP29 290 09/07/2013   Lab Results  Component Value Date   TSH 4.680* 11/19/2013      ASSESSMENT AND PLAN  81 y.o. year old female  has a past medical history of Dementia; and tension headaches when last seen which have improved.  MRI at that time showed Mesio-temporal atrophy indicative of memory loss disorder , As in Alzheimer's. small vessel disease has progressed since 2007.  Continue Razadyne at current dose will refill for memory loss will monitor MMSE every 6 months Perform physical therapy exercises daily, this will decrease liklihood of falls Walk for overall health and well-being Follow-up in 6 months Dennie Bible, [facility], [facility], APRN  [facility] [address], [place], [place] [address] [phone]

## 2014-04-26 DIAGNOSIS — I10 Essential (primary) hypertension: Secondary | ICD-10-CM | POA: Diagnosis not present

## 2014-06-16 DIAGNOSIS — Z6831 Body mass index (BMI) 31.0-31.9, adult: Secondary | ICD-10-CM | POA: Diagnosis not present

## 2014-06-16 DIAGNOSIS — M109 Gout, unspecified: Secondary | ICD-10-CM | POA: Diagnosis not present

## 2014-06-16 DIAGNOSIS — M7989 Other specified soft tissue disorders: Secondary | ICD-10-CM | POA: Diagnosis not present

## 2014-06-21 DIAGNOSIS — E871 Hypo-osmolality and hyponatremia: Secondary | ICD-10-CM | POA: Diagnosis not present

## 2014-06-21 DIAGNOSIS — G309 Alzheimer's disease, unspecified: Secondary | ICD-10-CM | POA: Diagnosis not present

## 2014-06-21 DIAGNOSIS — Z6831 Body mass index (BMI) 31.0-31.9, adult: Secondary | ICD-10-CM | POA: Diagnosis not present

## 2014-06-21 DIAGNOSIS — R269 Unspecified abnormalities of gait and mobility: Secondary | ICD-10-CM | POA: Diagnosis not present

## 2014-06-21 DIAGNOSIS — D509 Iron deficiency anemia, unspecified: Secondary | ICD-10-CM | POA: Diagnosis not present

## 2014-06-21 DIAGNOSIS — E785 Hyperlipidemia, unspecified: Secondary | ICD-10-CM | POA: Diagnosis not present

## 2014-06-21 DIAGNOSIS — I10 Essential (primary) hypertension: Secondary | ICD-10-CM | POA: Diagnosis not present

## 2014-06-21 DIAGNOSIS — M109 Gout, unspecified: Secondary | ICD-10-CM | POA: Diagnosis not present

## 2014-06-21 DIAGNOSIS — E559 Vitamin D deficiency, unspecified: Secondary | ICD-10-CM | POA: Diagnosis not present

## 2014-06-21 DIAGNOSIS — E039 Hypothyroidism, unspecified: Secondary | ICD-10-CM | POA: Diagnosis not present

## 2014-10-05 ENCOUNTER — Telehealth: Payer: Self-pay | Admitting: Neurology

## 2014-10-05 NOTE — Telephone Encounter (Signed)
Patient's daughter is calling because she is summoned for jury duty. Brianna [name] needs a note saying she can not serve because her Mom,the patient, has dementia and Brianna [name] is her caregiver. Please call Brianna [name] when ready for pick up. Thank you.

## 2014-10-05 NOTE — Telephone Encounter (Signed)
Spoke to daughter. Advised to bring copy of jury summons to our office. No charge for letter.

## 2014-10-11 ENCOUNTER — Encounter: Payer: Self-pay | Admitting: Neurology

## 2014-10-11 ENCOUNTER — Ambulatory Visit (INDEPENDENT_AMBULATORY_CARE_PROVIDER_SITE_OTHER): Payer: Medicare Other | Admitting: Neurology

## 2014-10-11 VITALS — BP 150/60 | HR 80 | Resp 20 | Ht 61.0 in | Wt 175.0 lb

## 2014-10-11 DIAGNOSIS — F02818 Dementia in other diseases classified elsewhere, unspecified severity, with other behavioral disturbance: Secondary | ICD-10-CM

## 2014-10-11 DIAGNOSIS — F0281 Dementia in other diseases classified elsewhere with behavioral disturbance: Secondary | ICD-10-CM

## 2014-10-11 DIAGNOSIS — F0280 Dementia in other diseases classified elsewhere without behavioral disturbance: Secondary | ICD-10-CM

## 2014-10-11 DIAGNOSIS — G309 Alzheimer's disease, unspecified: Secondary | ICD-10-CM | POA: Insufficient documentation

## 2014-10-11 HISTORY — DX: Dementia in other diseases classified elsewhere with behavioral disturbance: F02.81

## 2014-10-11 HISTORY — DX: Dementia in other diseases classified elsewhere, unspecified severity, with other behavioral disturbance: F02.818

## 2014-10-11 MED ORDER — DEXLANSOPRAZOLE 30 MG PO CPDR: 30.0000 mg | DELAYED_RELEASE_CAPSULE | Freq: Every day | ORAL | Status: DC

## 2014-10-11 MED ORDER — GALANTAMINE HYDROBROMIDE ER 16 MG PO CP24: ORAL_CAPSULE | ORAL | Status: DC

## 2014-10-11 NOTE — Progress Notes (Signed)
[facility]  PATIENT: KASSY MCENROE DOB: 1933/01/05   REASON FOR VISIT: Follow-up for Alzheimer's disease, tension headaches HISTORY FROM: Daughter    HISTORY OF PRESENT ILLNESS:81 year old caucasian female, who is a long established patient in our practice and carries the diagnosis of dementia, last seen by Dr. Brett Fairy 04/22/2013 for tension headaches. MRI at that time showed Mesio-temporal atrophy indicative of memory loss disorder , As in Alzheimer's. small vessel disease has progressed since 2007.  She has been on Excelon and Ambien and could not tolerate it, changed to Galantamine ER at 16 mg and she has not had side effects. Last EEG 2013 was remarkably normal . Mrs. Rund is no longer independent with ADL's.She requires some assistance  Her Mini-Mental status exam score is the same from 2014 today 20 out of 30. Her insomnia is better since shewas started on Trazodone with Dr. Forde Dandy. She has had several hospital ER visits most recently for hyponatremia. She is now on fluid restriction  She is still not easily motivated to participate in activities. She is very passive, does not exercise.  Appetite good. Sleeps well now. She returns for reevaluation  REVIEW OF SYSTEMS: Full 14 system review of systems performed and notable only for those listed, all others are neg:  Constitutional: neg  Cardiovascular: neg Ear/Nose/Throat: neg  Skin: neg Eyes: neg Respiratory: neg Gastroitestinal: neg  Hematology/Lymphatic: Anemia Endocrine: neg Musculoskeletal:neg Allergy/Immunology: neg Neurological: Memory loss Psychiatric: neg Sleep : neg   ALLERGIES: Allergies  Allergen Reactions  . Penicillins     unknown    HOME MEDICATIONS: Outpatient Prescriptions Prior to Visit  Medication Sig Dispense Refill  . galantamine (RAZADYNE ER) 16 MG 24 hr capsule TAKE ONE CAPSULE BY MOUTH EVERY DAY WITH BREAKFAST 30 capsule 6  . iron polysaccharides  (NU-IRON) 150 MG capsule Take 150 mg by mouth 3 (three) times daily.     Marland Kitchen levothyroxine (SYNTHROID, LEVOTHROID) 125 MCG tablet Take 125 mcg by mouth daily before breakfast.    . nisoldipine (SULAR) 17 MG 24 hr tablet Take 17 mg by mouth daily.     Marland Kitchen PRISTIQ 50 MG 24 hr tablet Take 50 mg by mouth daily.     . traZODone (DESYREL) 50 MG tablet Take 25 mg by mouth at bedtime.     . valsartan (DIOVAN) 320 MG tablet Take 320 mg by mouth daily.    . Vitamin D, Ergocalciferol, (DRISDOL) 50000 UNITS CAPS capsule Take 50,000 Units by mouth every 7 (seven) days. Tuesday    . NEXIUM 40 MG capsule Take 40 mg by mouth daily.      No facility-administered medications prior to visit.    PAST MEDICAL HISTORY: Past Medical History  Diagnosis Date  . DUB (dysfunctional uterine bleeding)   . Thyroid disease     Hypothyroid  . Hypertension   . Dementia   . Elevated cholesterol   . Kidney stone   . Hyperlipidemia   . Depression with anxiety   . Sleep apnea   . Adenomatous colon polyp     PAST SURGICAL HISTORY: Past Surgical History  Procedure Laterality Date  . Cholecystectomy    . Kidney stones    . Colon tumor-benign    . Abdominal hysterectomy    . Appendectomy      FAMILY HISTORY: Family History  Problem Relation Age of Onset  . Hypertension Brother   . Heart disease Brother   . Diabetes type II Brother   . Diabetes  type II Sister   . Heart disease Father   . Diabetes Mother   . Thyroid disease Child   . Diabetes Child     SOCIAL HISTORY: Social History   Social History  . Marital Status: Married    Spouse Name: [name]  . Number of Children: 4  . Years of Education: 8   Occupational History  .     Social History Main Topics  . Smoking status: Never Smoker   . Smokeless tobacco: Never Used  . Alcohol Use: No  . Drug Use: No  . Sexual Activity: No   Other Topics Concern  . Not on file   Social History Narrative   Patient is married [name]).   Patient has  four children.   Patient has an 8th grade education.   Patient is retired.   Patient is right-handed.   Patient drinks 3- 2 liters of Pepsi daily.     PHYSICAL EXAM  Filed Vitals:   10/11/14 1542  BP: 150/60  Pulse: 80  Resp: 20  Height: 5\' 1"  (1.549 m)  Weight: 175 lb (79.379 kg)   Body mass index is 33.08 kg/(m^2).  Generalized: Well developed, in no acute distress , gained weight from 120 pounds  6 years ago.  Head: normocephalic and atraumatic,. Oropharynx benign , no TMJ pain, she  is without teeth and lost her implanted teeth as well. Osteopenia and osteoporosis related jaw changes.  Neck: Supple, no carotid bruits  Cardiac: Regular rate rhythm, no murmur    Neurological examination   Mentation: Alert MMSE 20/30 6 month ago, repeat from 10-11-14 revealed again 20 out of 30 points. The patient was able to recall 1 out of 3 words in the Mini-Mental Status Examination she was able to comprehend multistep request. She was able to read and follow the bed instructions. Able to write a sentence. She was able to name. She is oriented fully to the place but less so to the time. She thought it was 59 and June.  Cranial nerves ; the patient denies loss of  smell or taste sensation Pupils were equal round reactive to light extraocular movements were full, visual field were full on confrontational test. Facial sensation and strength were normal. hearing was intact to finger rubbing bilaterally. Uvula tongue midline. head turning and shoulder shrug were normal and symmetric.Tongue protrusion into cheek strength was normal. Motor: normal bulk and tone, full strength in the BUE, BLE, fine finger movements normal, no pronator drift. No focal weakness Sensory: normal and symmetric to light touch, pinprick, and  Vibration, proprioception  Coordination: finger-nose-finger, heel-to-shin bilaterally, no dysmetria Reflexes: 2/2, plantar responses were flexor bilaterally.  Gait and Station: Rising  up from seated position without assistance, normal stance,  The patient appears slightly wrist rigid around the lumbar spine, she does have a normal arm swing on the right side, left side arm swing is restricted.  She turns with 5-1/2 steps makes very small steps. She does not shuffle.  She does confuse left and right at times.   DIAGNOSTIC DATA (LABS, IMAGING, TESTING) - I reviewed patient records, labs, notes, testing and imaging myself where available.  Lab Results  Component Value Date   [id number] 268 09/07/2013   Lab Results  Component Value Date   TSH 4.680* 11/19/2013      ASSESSMENT AND PLAN  81 y.o. year old female  has a past medical history of Dementia of  Alzheimer's disease as well as vascular component. ;  No longer able to report  tension headaches , when last seen may have improved.   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 Her primary care physician has restricted her fluid intake to help her preserve a normal sodium level. For while she had been seen at the hospital ED every other week with hyponatremia related spells of loss of consciousness, awareness changes, spells that looked like seizures.  Continue Razadyne at current dose, I  will refill for memory loss. I  will monitor MMSE every 6 months Perform physical therapy exercises daily, this will decrease risk of falls Walk for overall health and well-being.   35 minute visit with more than 50% of face to face time designated to discuss caregiver burden with her daughter, who also takes care of the father. 2 granddaughters help to some extend.  Mrs. Villescas daughter has been able to maintain the fluid restriction which has kept her mother from having to go back to the hospital now for many month. The sodium levels have normalized. The Mini-Mental Status Examination is stable yet there is a lack of spontaneous speech that tells me that her dementia has progressed. Her gait is  also somewhat restricted and she does not have some normal rotation ability and appears rigid en bloc. Suspected is no longer enjoy riding in the car or joining her daughter on outings. It would be nice if she could at least walks through the stores or the shopping mall on an even floor-  perhaps with a walker or shopping cart in front of her. Her daughter will implement some of these changes. There is little financial wiggle room for respite care.  Her daughter dresses and bathes is the patient, she feeds herself. I will see if there is a possibility to get some help for the household to give some respite time to her daughter. Referral for social worker services. Follow-up in 6 months with Dennie Bible, [facility] [address], [place], [place] [address] [phone]

## 2014-12-26 DIAGNOSIS — Z6834 Body mass index (BMI) 34.0-34.9, adult: Secondary | ICD-10-CM | POA: Diagnosis not present

## 2014-12-26 DIAGNOSIS — E871 Hypo-osmolality and hyponatremia: Secondary | ICD-10-CM | POA: Diagnosis not present

## 2014-12-26 DIAGNOSIS — D649 Anemia, unspecified: Secondary | ICD-10-CM | POA: Diagnosis not present

## 2014-12-26 DIAGNOSIS — I10 Essential (primary) hypertension: Secondary | ICD-10-CM | POA: Diagnosis not present

## 2014-12-26 DIAGNOSIS — E785 Hyperlipidemia, unspecified: Secondary | ICD-10-CM | POA: Diagnosis not present

## 2014-12-26 DIAGNOSIS — R269 Unspecified abnormalities of gait and mobility: Secondary | ICD-10-CM | POA: Diagnosis not present

## 2014-12-26 DIAGNOSIS — E039 Hypothyroidism, unspecified: Secondary | ICD-10-CM | POA: Diagnosis not present

## 2014-12-26 DIAGNOSIS — G309 Alzheimer's disease, unspecified: Secondary | ICD-10-CM | POA: Diagnosis not present

## 2014-12-26 DIAGNOSIS — E559 Vitamin D deficiency, unspecified: Secondary | ICD-10-CM | POA: Diagnosis not present

## 2015-02-10 DIAGNOSIS — E871 Hypo-osmolality and hyponatremia: Secondary | ICD-10-CM | POA: Diagnosis not present

## 2015-02-10 DIAGNOSIS — R2689 Other abnormalities of gait and mobility: Secondary | ICD-10-CM | POA: Diagnosis not present

## 2015-02-10 DIAGNOSIS — G309 Alzheimer's disease, unspecified: Secondary | ICD-10-CM | POA: Diagnosis not present

## 2015-02-10 DIAGNOSIS — F329 Major depressive disorder, single episode, unspecified: Secondary | ICD-10-CM | POA: Diagnosis not present

## 2015-02-10 DIAGNOSIS — Z6831 Body mass index (BMI) 31.0-31.9, adult: Secondary | ICD-10-CM | POA: Diagnosis not present

## 2015-02-10 DIAGNOSIS — D6489 Other specified anemias: Secondary | ICD-10-CM | POA: Diagnosis not present

## 2015-02-10 DIAGNOSIS — E038 Other specified hypothyroidism: Secondary | ICD-10-CM | POA: Diagnosis not present

## 2015-02-17 ENCOUNTER — Telehealth: Payer: Self-pay | Admitting: *Deleted

## 2015-02-17 NOTE — Telephone Encounter (Signed)
Daughter called this am. Pt has dementia.  Pt noted to have decreased arm movement yesterday per daughter.  This am pt stating cannot move legs or arms.   She did say that she could get her to move R arm eventually.   Speech different, slurred like, no facial droop.   I told her it was hard to say, she could have had stroke.  Call 911 to evaluate.   Daughter stated would do this.   Will call us back for update.

## 2015-02-20 ENCOUNTER — Ambulatory Visit (INDEPENDENT_AMBULATORY_CARE_PROVIDER_SITE_OTHER): Payer: Medicare Other | Admitting: Neurology

## 2015-02-20 ENCOUNTER — Encounter: Payer: Self-pay | Admitting: Neurology

## 2015-02-20 VITALS — BP 118/58 | HR 86 | Resp 20 | Ht 59.0 in | Wt 159.0 lb

## 2015-02-20 DIAGNOSIS — F068 Other specified mental disorders due to known physiological condition: Secondary | ICD-10-CM

## 2015-02-20 DIAGNOSIS — R41841 Cognitive communication deficit: Secondary | ICD-10-CM | POA: Diagnosis not present

## 2015-02-20 DIAGNOSIS — F0390 Unspecified dementia without behavioral disturbance: Secondary | ICD-10-CM

## 2015-02-20 DIAGNOSIS — R4189 Other symptoms and signs involving cognitive functions and awareness: Secondary | ICD-10-CM

## 2015-02-20 NOTE — Patient Instructions (Signed)
Vascular Dementia Vascular dementia is a common cause of dementia in the elderly. Dementia is a condition that affects the brain and causes people to not think well or act normally. Vascular dementia is one type of dementia. It occurs when blood clots block small blood vessels in the brain and destroy brain tissue. Likely risk factors are high blood pressure and advanced age. This disease can cause stroke, migraine-like headaches, and psychiatric disturbances.  SYMPTOMS   Confusion.  Problems with recent memory.  Wandering or getting lost in familiar places.  Loss of bladder or bowel control (incontinence).  Unsteady gait.  Poor attention and concentration.  Emotional problems such as laughing or crying inappropriately.  Difficulty following instructions.  Problems handling money.  Depression.  Difficulty planning ahead. Usually the damage is slight at first. Over time, as more small vessels are blocked, there is a gradual mental decline. However, symptoms may begin suddenly. Symptoms may be very similar to Alzheimer's disease. The two forms of dementia may occur together. Vascular dementia typically begins between the ages of 60 and 75. It affects men more often than women. TREATMENT   Currently there is no treatment for vascular dementia that can reverse the damage that has already occurred.  Treatment focuses on prevention of additional brain damage and improvement of symptoms.  It is important to treat the risk factors for vascular dementia, such as keeping blood pressure under control, treating diabetes, lowering cholesterol, and stop smoking. PROGNOSIS   Prognosis for patients is generally poor. Individuals with the disease may improve for short periods of time, then get worse again. Early treatment and management of blood pressure and other risk factors may prevent further worsening of the disorder.   This information is not intended to replace advice given to you by your  health care provider. Make sure you discuss any questions you have with your health care provider.   Document Released: 01/04/2002 Document Revised: 04/08/2011 Document Reviewed: 04/27/2014 Elsevier Interactive Patient Education 2016 Elsevier Inc.  

## 2015-02-20 NOTE — Progress Notes (Signed)
[facility]  PATIENT: Brianna Brianna [name] DOB: [birth year]   REASON FOR VISIT: Follow-up for Alzheimer's disease, tension headaches HISTORY FROM: Daughter    HISTORY OF PRESENT ILLNESS:82 year old caucasian female, who is a long established patient in our practice and carries the diagnosis of dementia, last seen by Dr. Vickey Huger 04/22/2013 for tension headaches. MRI at that time showed Mesio-temporal atrophy indicative of memory loss disorder , As in Alzheimer's. small vessel disease has progressed since 2007.  She has been on Excelon and Ambien and could not tolerate it, changed to Galantamine ER at 16 mg and she has not had side effects. Last EEG 2013 was remarkably normal .  Brianna Brianna [name] is no longer independent with ADL's.She requires 24/7 assistance . She is more and ore unable  to walk. Daughter requests PT for gait training.  Her Mini-Mental status exam score is the same from 2014 through 2016 at 20 out of 30.  Now declined MMSE - Mini Mental State Exam 02/20/2015 10/11/2014 04/08/2014  Orientation to time 0 1 2  Orientation to Place 2 5 5   Registration 3 3 3   Attention/ Calculation 1 3 2   Recall 0 1 0  Language- name 2 objects 1 2 2   Language- repeat 0 0 1  Language- follow 3 step command 3 3 3   Language- read & follow direction 0 1 1  Write a sentence 0 1 1  Copy design 0 0 0  Total score 10 20 20    Her insomnia is better since shewas started on Trazodone with Dr. Evlyn Kanner. She has had several hospital ER visits Brianna [name] recently for hyponatremia. She is now on fluid restriction, she is passive and sleeping Brianna [name] of the day.   She is still not easily motivated to participate in activities. She is very passive, does not exercise, does no answer, appears limited - verbally .  Appetite good. Sleeps well now. She returns for reevaluation  REVIEW OF SYSTEMS: Full 14 system review of systems performed and notable only for those listed, all others are neg:  Mrs.  [name] has continued to suffer memory loss and she scored only 10 points in a Mini-Mental Status Examination today on 02/20/2015. 2 in the animal fluency test unable to draw clock face unable to copy  an image she was partially able to write a sentence. She is using diapers now at night. She is less confused since her urinary tract infection was treated, she also seems to sleep better and through the night. However nocturnal incontinence has also developed recently. She has swollen fingers and the dorsal manners, I wonder if she may indeed have gout as her daughter suspected she seems to have pain over the thenar eminence and at the thenar joint. She denies toe pain.   ALLERGIES: Allergies  Allergen Reactions  . Penicillins     unknown    HOME MEDICATIONS: Outpatient Prescriptions Prior to Visit  Medication Sig Dispense Refill  . Dexlansoprazole 30 MG capsule Take 1 capsule (30 mg total) by mouth daily. 30 capsule 5  . galantamine (RAZADYNE ER) 16 MG 24 hr capsule TAKE ONE CAPSULE BY MOUTH EVERY DAY WITH BREAKFAST 30 capsule 6  . iron polysaccharides (NU-IRON) 150 MG capsule Take 150 mg by mouth 3 (three) times daily.     Marland Kitchen levothyroxine (SYNTHROID, LEVOTHROID) 125 MCG tablet Take 125 mcg by mouth daily before breakfast.    . nisoldipine (SULAR) 17 MG 24 hr tablet Take 17 mg by mouth daily.     Marland Kitchen  PRISTIQ 50 MG 24 hr tablet Take 50 mg by mouth daily.     . traZODone (DESYREL) 50 MG tablet Take 25 mg by mouth at bedtime.     . valsartan (DIOVAN) 320 MG tablet Take 320 mg by mouth daily.    . Vitamin D, Ergocalciferol, (DRISDOL) 50000 UNITS CAPS capsule Take 50,000 Units by mouth every 7 (seven) days. Tuesday     No facility-administered medications prior to visit.    PAST MEDICAL HISTORY: Past Medical History  Diagnosis Date  . DUB (dysfunctional uterine bleeding)   . Thyroid disease     Hypothyroid  . Hypertension   . Dementia   . Elevated cholesterol   . Kidney stone   .  Hyperlipidemia   . Depression with anxiety   . Sleep apnea   . Adenomatous colon polyp     PAST SURGICAL HISTORY: Past Surgical History  Procedure Laterality Date  . Cholecystectomy    . Kidney stones    . Colon tumor-benign    . Abdominal hysterectomy    . Appendectomy      FAMILY HISTORY: Family History  Problem Relation Age of Onset  . Hypertension Brother   . Heart disease Brother   . Diabetes type II Brother   . Diabetes type II Sister   . Heart disease Father   . Diabetes Mother   . Thyroid disease Child   . Diabetes Child     SOCIAL HISTORY: Social History   Social History  . Marital Status: Married    Spouse Name: Brianna Brianna [name]  . Number of Children: 4  . Years of Education: 8   Occupational History  .     Social History Main Topics  . Smoking status: Never Smoker   . Smokeless tobacco: Never Used  . Alcohol Use: No  . Drug Use: No  . Sexual Activity: No   Other Topics Concern  . Not on file   Social History Narrative   Patient is married Brianna Brianna [name]).   Patient has four children.   Patient has an 8th grade education.   Patient is retired.   Patient is right-handed.   Patient drinks 3- 2 liters of Pepsi daily.     PHYSICAL EXAM  Filed Vitals:   02/20/15 1544  BP: 118/58  Pulse: 86  Resp: 20  Height: 4\' 11"  (1.499 m)  Weight: 159 lb (72.122 kg)   Body mass index is 32.1 kg/(m^2).  Generalized: Well developed, in no acute distress , gained weight from 120 pounds  6 years ago.  Head: normocephalic and atraumatic,. Oropharynx benign , no TMJ pain, she  is without teeth and lost her implanted teeth as well. Osteopenia and osteoporosis related jaw changes.  Neck: Supple, no carotid bruits  Cardiac: Regular rate rhythm, no murmur    Neurological examination   Mentation: Alert MMSE 20/30 6 month ago, repeat from 10-11-14 revealed again 20 out of 30 points.  02-20-15 10 out of 30 !  The patient was able to recall none of 3 words in the Mini-Mental  Status Examination she was able to comprehend multistep request. She was able to read and follow the bed instructions. Able to write a sentence. She was able to name. She is oriented fully to the place but less so to the time. She thought it was 25 and June.  Cranial nerves ; the patient denies loss of  smell or taste sensation Pupils were equal round reactive to light extraocular movements were full, visual  field were full on confrontational test. Facial sensation and strength were normal. hearing was intact to finger rubbing bilaterally. Uvula tongue midline. head turning and shoulder shrug were normal and symmetric.Tongue protrusion into cheek strength was normal. Motor: normal bulk and tone, no pronator drift. No focal weakness, has swollen hands and fingers, not feet and toes. Seems to be sensitive to touch .   Coordination: finger-nose-finger, heel-to-shin bilaterally, no dysmetria Reflexes: 2/2, plantar responses were flexor bilaterally.  Gait and Station: Rising up from seated position without assistance, normal stance,  The patient appears slightly wrist rigid around the lumbar spine, she does have a normal arm swing on the right side, left side arm swing is restricted.  She turns with 5-1/2 steps makes very small steps. She does not shuffle.  She does confuse left and right at times.    DIAGNOSTIC DATA (LABS, IMAGING, TESTING) - I reviewed patient records, labs, notes, testing and imaging myself where available.  Lab Results  Component Value Date   [id number] 290 09/07/2013   Lab Results  Component Value Date   TSH 4.680* 11/19/2013      ASSESSMENT AND PLAN  82 y.o. year old female  has a past medical history of Dementia of Alzheimer's disease as well as vascular component. The patient has a severe dementia. ;   MRI brain showed Mesio-temporal atrophy indicative of memory loss disorder . Her primary care physician has restricted her fluid intake to help her preserve a  normal sodium level.  For while she had been seen at the hospital ED every other week with hyponatremia related spells of loss of consciousness, awareness changes, spells that looked like seizures.  Continue Razadyne at current dose, I  will refill for memory loss. We will monitor MMSE every 6 months Perform physical therapy exercises daily, this will decrease risk of falls Walk for overall health and well-being. Pt at the home requested.    35 minute visit with more than 50% of face to face time designated to discuss caregiver burden with her daughter, who also takes care of the father. 2 granddaughters help to some extend.  Mrs. Eighmy daughter has been able to maintain the fluid restriction which has kept her mother from having to go back to the hospital now for many month. The sodium levels have normalized. The Mini-Mental Status Examination is stable yet there is a lack of spontaneous speech that tells me that her dementia has progressed.  10-2 30 poins. Her gait is also somewhat restricted and she does not have some normal rotation ability and appears rigid en bloc. Suspected is no longer enjoy riding in the car or joining her daughter on outings. It would be nice if she could at least walks through the stores or the shopping mall on an even floor-  perhaps with a walker or shopping cart in front of her. Her daughter will implement some of these changes. There is little financial wiggle room for respite care.  Her daughter dresses and bathes is the patient, she feeds herself not longer since early 2017 ! Marland Kitchen I will see if there is a possibility to get some help for the household to give some respite time to her daughter.   Referral for social worker services.  Encourage moderate diet and hydration.  Labs reviewed. UTI treated. After treatment her cognition improved and she developed Gout on ATB !    Follow-up in 6 months with Nilda Riggs, [facility]    Thekla Colborn, MD  [facility] [address], [address] [place], [place] [address] [phone]

## 2015-03-03 ENCOUNTER — Telehealth: Payer: Self-pay | Admitting: Neurology

## 2015-03-03 DIAGNOSIS — R262 Difficulty in walking, not elsewhere classified: Secondary | ICD-10-CM

## 2015-03-03 NOTE — Telephone Encounter (Signed)
[name] [phone] called regarding patient having a lot of trouble walking, would like to know if Rehab would help?

## 2015-03-03 NOTE — Telephone Encounter (Signed)
Daughter wants to know if rehab or pt is appropriate. I suggest in home rehab, or PT in home. She has lost the use of her hands and seems non verbal at times, good days and bad days.  Is not opposed to a rehab stay at facility, wants [place] to research this. cd

## 2015-03-03 NOTE — Telephone Encounter (Signed)
I'm not sure her mental abilities will allow her to participate with rehab More likely, she needs long term SNF

## 2015-03-06 NOTE — Telephone Encounter (Signed)
Referral placed to a company that performs in-home physical therapy.  Hinton Dyer, will you set this up, and then call pt's daughter and let her know? Thanks!

## 2015-03-06 NOTE — Addendum Note (Signed)
Addended by: Lester Hoagland A on: 03/06/2015 03:19 PM   Modules accepted: Orders

## 2015-03-07 NOTE — Telephone Encounter (Signed)
Faxed orders and notes to [facility] for scheduling Home Physical Therapy. Relayed to [place] to please call daughter for scheduling.

## 2015-03-08 ENCOUNTER — Inpatient Hospital Stay (HOSPITAL_COMMUNITY)
Admission: EM | Admit: 2015-03-08 | Discharge: 2015-03-12 | DRG: 641 | Disposition: A | Discharge status: Skilled Nursing Facility | Payer: Medicare Other | Attending: Internal Medicine | Admitting: Internal Medicine

## 2015-03-08 ENCOUNTER — Inpatient Hospital Stay (HOSPITAL_COMMUNITY): Payer: Medicare Other

## 2015-03-08 ENCOUNTER — Emergency Department (HOSPITAL_COMMUNITY): Payer: Medicare Other

## 2015-03-08 DIAGNOSIS — R488 Other symbolic dysfunctions: Secondary | ICD-10-CM | POA: Diagnosis not present

## 2015-03-08 DIAGNOSIS — E871 Hypo-osmolality and hyponatremia: Secondary | ICD-10-CM | POA: Diagnosis present

## 2015-03-08 DIAGNOSIS — I10 Essential (primary) hypertension: Secondary | ICD-10-CM | POA: Diagnosis present

## 2015-03-08 DIAGNOSIS — R531 Weakness: Secondary | ICD-10-CM

## 2015-03-08 DIAGNOSIS — E860 Dehydration: Secondary | ICD-10-CM | POA: Diagnosis present

## 2015-03-08 DIAGNOSIS — N179 Acute kidney failure, unspecified: Secondary | ICD-10-CM | POA: Diagnosis not present

## 2015-03-08 DIAGNOSIS — Z8249 Family history of ischemic heart disease and other diseases of the circulatory system: Secondary | ICD-10-CM | POA: Diagnosis not present

## 2015-03-08 DIAGNOSIS — Z833 Family history of diabetes mellitus: Secondary | ICD-10-CM | POA: Diagnosis not present

## 2015-03-08 DIAGNOSIS — R55 Syncope and collapse: Secondary | ICD-10-CM

## 2015-03-08 DIAGNOSIS — Z9049 Acquired absence of other specified parts of digestive tract: Secondary | ICD-10-CM | POA: Diagnosis not present

## 2015-03-08 DIAGNOSIS — R262 Difficulty in walking, not elsewhere classified: Secondary | ICD-10-CM | POA: Diagnosis not present

## 2015-03-08 DIAGNOSIS — S199XXA Unspecified injury of neck, initial encounter: Secondary | ICD-10-CM | POA: Diagnosis not present

## 2015-03-08 DIAGNOSIS — S0990XA Unspecified injury of head, initial encounter: Secondary | ICD-10-CM | POA: Diagnosis not present

## 2015-03-08 DIAGNOSIS — M6281 Muscle weakness (generalized): Secondary | ICD-10-CM | POA: Diagnosis not present

## 2015-03-08 DIAGNOSIS — K219 Gastro-esophageal reflux disease without esophagitis: Secondary | ICD-10-CM | POA: Diagnosis present

## 2015-03-08 DIAGNOSIS — R293 Abnormal posture: Secondary | ICD-10-CM | POA: Diagnosis not present

## 2015-03-08 DIAGNOSIS — F0390 Unspecified dementia without behavioral disturbance: Secondary | ICD-10-CM | POA: Diagnosis present

## 2015-03-08 DIAGNOSIS — E039 Hypothyroidism, unspecified: Secondary | ICD-10-CM | POA: Diagnosis present

## 2015-03-08 DIAGNOSIS — F418 Other specified anxiety disorders: Secondary | ICD-10-CM | POA: Diagnosis present

## 2015-03-08 DIAGNOSIS — E7800 Pure hypercholesterolemia, unspecified: Secondary | ICD-10-CM | POA: Diagnosis present

## 2015-03-08 DIAGNOSIS — R404 Transient alteration of awareness: Secondary | ICD-10-CM | POA: Diagnosis not present

## 2015-03-08 DIAGNOSIS — Z79899 Other long term (current) drug therapy: Secondary | ICD-10-CM | POA: Diagnosis not present

## 2015-03-08 DIAGNOSIS — R5381 Other malaise: Secondary | ICD-10-CM

## 2015-03-08 DIAGNOSIS — D638 Anemia in other chronic diseases classified elsewhere: Secondary | ICD-10-CM | POA: Diagnosis not present

## 2015-03-08 DIAGNOSIS — D509 Iron deficiency anemia, unspecified: Secondary | ICD-10-CM | POA: Diagnosis present

## 2015-03-08 DIAGNOSIS — G4730 Sleep apnea, unspecified: Secondary | ICD-10-CM | POA: Diagnosis present

## 2015-03-08 DIAGNOSIS — Z90710 Acquired absence of both cervix and uterus: Secondary | ICD-10-CM | POA: Diagnosis not present

## 2015-03-08 DIAGNOSIS — R197 Diarrhea, unspecified: Secondary | ICD-10-CM | POA: Diagnosis not present

## 2015-03-08 DIAGNOSIS — E785 Hyperlipidemia, unspecified: Secondary | ICD-10-CM | POA: Diagnosis present

## 2015-03-08 DIAGNOSIS — Z9181 History of falling: Secondary | ICD-10-CM | POA: Diagnosis not present

## 2015-03-08 LAB — IRON AND TIBC
Iron: 11 ug/dL — ABNORMAL LOW (ref 28–170)
SATURATION RATIOS: 5 % — AB (ref 10.4–31.8)
TIBC: 241 ug/dL — AB (ref 250–450)
UIBC: 230 ug/dL

## 2015-03-08 LAB — BASIC METABOLIC PANEL
ANION GAP: 15 (ref 5–15)
Anion gap: 15 (ref 5–15)
BUN: 58 mg/dL — ABNORMAL HIGH (ref 6–20)
BUN: 55 mg/dL — ABNORMAL HIGH (ref 6–20)
CHLORIDE: 102 mmol/L (ref 101–111)
CO2: 14 mmol/L — ABNORMAL LOW (ref 22–32)
CO2: 13 mmol/L — AB (ref 22–32)
CREATININE: 2 mg/dL — AB (ref 0.44–1.00)
Calcium: 9.3 mg/dL (ref 8.9–10.3)
Calcium: 8.8 mg/dL — ABNORMAL LOW (ref 8.9–10.3)
Chloride: 97 mmol/L — ABNORMAL LOW (ref 101–111)
Creatinine, Ser: 2.36 mg/dL — ABNORMAL HIGH (ref 0.44–1.00)
GFR calc Af Amer: 21 mL/min — ABNORMAL LOW (ref >60)
GFR, EST AFRICAN AMERICAN: 26 mL/min — AB (ref >60)
GFR, EST NON AFRICAN AMERICAN: 18 mL/min — AB (ref >60)
GFR, EST NON AFRICAN AMERICAN: 22 mL/min — AB (ref >60)
GLUCOSE: 96 mg/dL (ref 65–99)
GLUCOSE: 102 mg/dL — AB (ref 65–99)
POTASSIUM: 4.8 mmol/L (ref 3.5–5.1)
Potassium: 4.8 mmol/L (ref 3.5–5.1)
Sodium: 126 mmol/L — ABNORMAL LOW (ref 135–145)
Sodium: 130 mmol/L — ABNORMAL LOW (ref 135–145)

## 2015-03-08 LAB — CBC
HEMATOCRIT: 28.4 % — AB (ref 36.0–46.0)
HEMOGLOBIN: 9.2 g/dL — AB (ref 12.0–15.0)
MCH: 24.5 pg — ABNORMAL LOW (ref 26.0–34.0)
MCHC: 32.4 g/dL (ref 30.0–36.0)
MCV: 75.5 fL — ABNORMAL LOW (ref 78.0–100.0)
Platelets: 414 10*3/uL — ABNORMAL HIGH (ref 150–400)
RBC: 3.76 MIL/uL — AB (ref 3.87–5.11)
RDW: 15.1 % (ref 11.5–15.5)
WBC: 9 10*3/uL (ref 4.0–10.5)

## 2015-03-08 LAB — RETICULOCYTES
RBC.: 3.38 MIL/uL — ABNORMAL LOW (ref 3.87–5.11)
RETIC CT PCT: 1.6 % (ref 0.4–3.1)
Retic Count, Absolute: 54.1 10*3/uL (ref 19.0–186.0)

## 2015-03-08 LAB — MAGNESIUM: MAGNESIUM: 1.8 mg/dL (ref 1.7–2.4)

## 2015-03-08 LAB — VITAMIN B12: Vitamin B-12: 323 pg/mL (ref 180–914)

## 2015-03-08 LAB — POC OCCULT BLOOD, ED: Fecal Occult Bld: NEGATIVE

## 2015-03-08 LAB — FOLATE: Folate: 11 ng/mL (ref >5.9)

## 2015-03-08 LAB — FERRITIN: FERRITIN: 222 ng/mL (ref 11–307)

## 2015-03-08 LAB — PHOSPHORUS: Phosphorus: 4.2 mg/dL (ref 2.5–4.6)

## 2015-03-08 MED ORDER — IRBESARTAN 300 MG PO TABS
300.0000 mg | ORAL_TABLET | Freq: Every day | ORAL | Status: DC
  Administered 2015-03-09 – 2015-03-11 (×3): 300 mg via ORAL
  Filled 2015-03-08 (×7): qty 1

## 2015-03-08 MED ORDER — SODIUM CHLORIDE 0.9 % IV SOLN
INTRAVENOUS | Status: DC
  Administered 2015-03-08: 17:00:00 via INTRAVENOUS
  Administered 2015-03-08: 100 mL via INTRAVENOUS
  Administered 2015-03-09: 03:00:00 via INTRAVENOUS

## 2015-03-08 MED ORDER — SODIUM CHLORIDE 0.9% FLUSH
3.0000 mL | Freq: Two times a day (BID) | INTRAVENOUS | Status: DC
  Administered 2015-03-10: 3 mL via INTRAVENOUS

## 2015-03-08 MED ORDER — HEPARIN SODIUM (PORCINE) 5000 UNIT/ML IJ SOLN
5000.0000 [IU] | Freq: Three times a day (TID) | INTRAMUSCULAR | Status: DC
  Administered 2015-03-08 – 2015-03-12 (×10): 5000 [IU] via SUBCUTANEOUS
  Filled 2015-03-08 (×10): qty 1

## 2015-03-08 MED ORDER — NISOLDIPINE ER 17 MG PO TB24
17.0000 mg | ORAL_TABLET | Freq: Every day | ORAL | Status: DC
  Administered 2015-03-09 – 2015-03-11 (×3): 17 mg via ORAL
  Filled 2015-03-08 (×5): qty 1

## 2015-03-08 MED ORDER — SODIUM CHLORIDE 0.9% FLUSH: 3.0000 mL | Freq: Two times a day (BID) | INTRAVENOUS | Status: DC

## 2015-03-08 MED ORDER — VENLAFAXINE HCL ER 75 MG PO CP24
75.0000 mg | ORAL_CAPSULE | Freq: Every day | ORAL | Status: DC
  Administered 2015-03-09 – 2015-03-11 (×3): 75 mg via ORAL
  Filled 2015-03-08 (×4): qty 1

## 2015-03-08 MED ORDER — TRAZODONE HCL 50 MG PO TABS
25.0000 mg | ORAL_TABLET | Freq: Every day | ORAL | Status: DC
  Administered 2015-03-09 – 2015-03-11 (×3): 25 mg via ORAL
  Filled 2015-03-08 (×4): qty 1

## 2015-03-08 MED ORDER — SODIUM CHLORIDE 0.9 % IV BOLUS (SEPSIS)
1000.0000 mL | Freq: Once | INTRAVENOUS | Status: AC
  Administered 2015-03-08: 1000 mL via INTRAVENOUS

## 2015-03-08 MED ORDER — PANTOPRAZOLE SODIUM 40 MG PO TBEC
80.0000 mg | DELAYED_RELEASE_TABLET | Freq: Every day | ORAL | Status: DC
  Administered 2015-03-09 – 2015-03-11 (×3): 80 mg via ORAL
  Filled 2015-03-08 (×3): qty 2

## 2015-03-08 MED ORDER — ONDANSETRON HCL 4 MG/2ML IJ SOLN: 4.0000 mg | Freq: Four times a day (QID) | INTRAMUSCULAR | Status: DC | PRN

## 2015-03-08 MED ORDER — ONDANSETRON HCL 4 MG PO TABS: 4.0000 mg | ORAL_TABLET | Freq: Four times a day (QID) | ORAL | Status: DC | PRN

## 2015-03-08 MED ORDER — LEVOTHYROXINE SODIUM 125 MCG PO TABS
125.0000 ug | ORAL_TABLET | Freq: Every day | ORAL | Status: DC
  Filled 2015-03-08: qty 1

## 2015-03-08 NOTE — Progress Notes (Signed)
Dr. Marily Memos notified via tedt page of pt's location.  Karie Kirks, Therapist, sports.

## 2015-03-08 NOTE — ED Notes (Signed)
Per [facility] patient had a witness fall yesterday without LOC.  Patient was evaluated by EMS but refused transport.  Today family states the patient had a witnessed syncopal episode. Patient has a history of dementia, is able to follow commands.  Patient alert and oriented to self, in no apparent distress at this time.

## 2015-03-08 NOTE — Progress Notes (Signed)
At 11750 introduced self to pt.  A&0x1 self.  Oriented to room .  Place call bell at bedside and asked her to use it for assistance.  Verbalized yes.  Daughter accompanied pt from the ED.  Will continue to monitor.  Karie Kirks, Therapist, sports.

## 2015-03-08 NOTE — ED Notes (Signed)
Patient transported to CT 

## 2015-03-08 NOTE — ED Notes (Signed)
Patient undressed, in gown, on monitor, continuous pulse oximetry and blood pressure cuff; visitor at bedside 

## 2015-03-08 NOTE — Progress Notes (Signed)
Unabe to obtained pt';s orthostatic VS as pt is week unable to stand.  Karie Kirks, RN

## 2015-03-08 NOTE — ED Provider Notes (Signed)
CSN: [id number]     Arrival date & time 03/08/15  1141 History   First MD Initiated Contact with Patient 03/08/15 1213     Chief Complaint  Patient presents with  . Loss of Consciousness   L5 caveat dementia history is obtained from patient's daughter, her caregiver  (Consider location/radiation/quality/duration/timing/severity/associated sxs/prior Treatment) HPI Patient had syncopal event today lasting 2 or 3 minutes. She's had diarrhea for the past 2 days possibly 6 episodes yesterday and one or 2 episodes today. She has been lightheaded and unable to walk due to generalized weakness. Her daughter has been pushing her around in a chair. Today while in the chair she has syncopal event. Patient complains of generalized malaise but states nothing hurts. No other associated symptoms. She was on an antibiotic approximately 2 weeks ago for a UTI. No recent travel.. She fell yesterday, striking her head. Past Medical History  Diagnosis Date  . DUB (dysfunctional uterine bleeding)   . Thyroid disease     Hypothyroid  . Hypertension   . Dementia   . Elevated cholesterol   . Kidney stone   . Hyperlipidemia   . Depression with anxiety   . Sleep apnea   . Adenomatous colon polyp    Past Surgical History  Procedure Laterality Date  . Cholecystectomy    . Kidney stones    . Colon tumor-benign    . Abdominal hysterectomy    . Appendectomy     Family History  Problem Relation Age of Onset  . Hypertension Brother   . Heart disease Brother   . Diabetes type II Brother   . Diabetes type II Sister   . Heart disease Father   . Diabetes Mother   . Thyroid disease Child   . Diabetes Child    Social History  Substance Use Topics  . Smoking status: Never Smoker   . Smokeless tobacco: Never Used  . Alcohol Use: No   OB History    Gravida Para Term Preterm AB TAB SAB Ectopic Multiple Living   3 3 3       3      Review of Systems  Unable to perform ROS: Dementia  Gastrointestinal:  Positive for diarrhea. Negative for blood in stool.      Allergies  Penicillins  Home Medications   Prior to Admission medications   Medication Sig Start Date End Date Taking? Authorizing Provider  ciprofloxacin (CIPRO) 500 MG tablet TAKE ONE TABLET BY MOUTH TWICE DAILY X 7 DAYS 02/15/15   Historical Provider, MD  colchicine 0.6 MG tablet  02/15/15   Historical Provider, MD  Dexlansoprazole 30 MG capsule Take 1 capsule (30 mg total) by mouth daily. 10/11/14   Asencion Partridge Dohmeier, MD  galantamine (RAZADYNE ER) 16 MG 24 hr capsule TAKE ONE CAPSULE BY MOUTH EVERY DAY WITH BREAKFAST 10/11/14   Larey Seat, MD  iron polysaccharides (NU-IRON) 150 MG capsule Take 150 mg by mouth 3 (three) times daily.  06/02/12   Historical Provider, MD  levothyroxine (SYNTHROID, LEVOTHROID) 125 MCG tablet Take 125 mcg by mouth daily before breakfast.    Historical Provider, MD  nisoldipine (SULAR) 17 MG 24 hr tablet Take 17 mg by mouth daily.  03/13/09   Historical Provider, MD  PRISTIQ 50 MG 24 hr tablet Take 50 mg by mouth daily.  04/05/13   Historical Provider, MD  traZODone (DESYREL) 50 MG tablet Take 25 mg by mouth at bedtime.  02/16/13   Historical Provider, MD  valsartan (DIOVAN) 320  MG tablet Take 320 mg by mouth daily.    Historical Provider, MD  Vitamin D, Ergocalciferol, (DRISDOL) 50000 UNITS CAPS capsule Take 50,000 Units by mouth every 7 (seven) days. Tuesday    Historical Provider, MD   BP 99/44 mmHg  Pulse 75  Temp(Src) 98.2 F (36.8 C)  Resp 16  SpO2 100% Physical Exam  Constitutional: She appears well-developed and well-nourished.  HENT:  Head: Normocephalic and atraumatic.  Dry mucous membranes  Eyes: Conjunctivae are normal. Pupils are equal, round, and reactive to light.  Conjunctiva pale  Neck: Neck supple. No tracheal deviation present. No thyromegaly present.  Cardiovascular: Normal rate and regular rhythm.   No murmur heard. Pulmonary/Chest: Effort normal and breath sounds normal.   Abdominal: Soft. Bowel sounds are normal. She exhibits no distension. There is no tenderness.  Genitourinary: Guaiac negative stool.  Rectal normal tone and brown stool  Musculoskeletal: Normal range of motion. She exhibits no edema or tenderness.  Neurological: She is alert. Coordination normal.  Skin: Skin is warm and dry. No rash noted.  Psychiatric: She has a normal mood and affect.  Nursing note and vitals reviewed.   ED Course  Procedures (including critical care time) Labs Review Labs Reviewed  BASIC METABOLIC PANEL  CBC  URINALYSIS, ROUTINE W REFLEX MICROSCOPIC (NOT AT [facility])  CBG MONITORING, ED    Imaging Review No results found. I have personally reviewed and evaluated these images and lab results as part of my medical decision-making.   EKG Interpretation None     Results for orders placed or performed during the hospital encounter of [id number]  Basic metabolic panel  Result Value Ref Range   Sodium 126 (L) 135 - 145 mmol/L   Potassium 4.8 3.5 - 5.1 mmol/L   Chloride 97 (L) 101 - 111 mmol/L   CO2 14 (L) 22 - 32 mmol/L   Glucose, Bld 96 65 - 99 mg/dL   BUN 58 (H) 6 - 20 mg/dL   Creatinine, Ser 2.36 (H) 0.44 - 1.00 mg/dL   Calcium 9.3 8.9 - 10.3 mg/dL   GFR calc non Af Amer 18 (L) >60 mL/min   GFR calc Af Amer 21 (L) >60 mL/min   Anion gap 15 5 - 15  CBC  Result Value Ref Range   WBC 9.0 4.0 - 10.5 K/uL   RBC 3.76 (L) 3.87 - 5.11 MIL/uL   Hemoglobin 9.2 (L) 12.0 - 15.0 g/dL   HCT 28.4 (L) 36.0 - 46.0 %   MCV 75.5 (L) 78.0 - 100.0 fL   MCH 24.5 (L) 26.0 - 34.0 pg   MCHC 32.4 30.0 - 36.0 g/dL   RDW 15.1 11.5 - 15.5 %   Platelets 414 (H) 150 - 400 K/uL   Ct Head Wo Contrast  03/08/2015  CLINICAL DATA:  Witnessed fall on 03/07/2015, refused transport by EMS, no loss of consciousness, fell again today, agitation, history hypertension, dementia, hyperlipidemia EXAM: CT HEAD WITHOUT CONTRAST CT CERVICAL SPINE WITHOUT CONTRAST TECHNIQUE: Multidetector CT imaging  of the head and cervical spine was performed following the standard protocol without intravenous contrast. Multiplanar CT image reconstructions of the cervical spine were also generated. COMPARISON:  CT head 02/04/2014, CT cervical spine 11/19/2013 FINDINGS: CT HEAD FINDINGS Exam degraded by motion artifacts. Generalized atrophy. Normal ventricular morphology. No midline shift or mass effect. Small vessel chronic ischemic changes of deep cerebral white matter. No gross intracranial hemorrhage, mass lesion, or acute infarction. No extra-axial fluid collections. Visualized paranasal sinuses and mastoid  air cells clear. Bones unremarkable. CT CERVICAL SPINE FINDINGS Prevertebral soft tissues normal thickness. Beam hardening artifacts from the shoulders. Multilevel disc space narrowing and endplate spur formation. Vertebral body heights maintained without fracture or subluxation. Multilevel facet degenerative changes bilaterally. Visualized skullbase intact. Lung apices clear. IMPRESSION: Atrophy with small vessel chronic ischemic changes of deep cerebral white matter. Note definite acute intracranial abnormalities identified on exam limited by patient motion. Multilevel degenerative disc and facet disease changes cervical spine. No acute cervical spine abnormalities. Electronically Signed   By: Lavonia Dana M.D.   On: 03/08/2015 14:38   Ct Cervical Spine Wo Contrast  03/08/2015  CLINICAL DATA:  Witnessed fall on 03/07/2015, refused transport by EMS, no loss of consciousness, fell again today, agitation, history hypertension, dementia, hyperlipidemia EXAM: CT HEAD WITHOUT CONTRAST CT CERVICAL SPINE WITHOUT CONTRAST TECHNIQUE: Multidetector CT imaging of the head and cervical spine was performed following the standard protocol without intravenous contrast. Multiplanar CT image reconstructions of the cervical spine were also generated. COMPARISON:  CT head 02/04/2014, CT cervical spine 11/19/2013 FINDINGS: CT HEAD  FINDINGS Exam degraded by motion artifacts. Generalized atrophy. Normal ventricular morphology. No midline shift or mass effect. Small vessel chronic ischemic changes of deep cerebral white matter. No gross intracranial hemorrhage, mass lesion, or acute infarction. No extra-axial fluid collections. Visualized paranasal sinuses and mastoid air cells clear. Bones unremarkable. CT CERVICAL SPINE FINDINGS Prevertebral soft tissues normal thickness. Beam hardening artifacts from the shoulders. Multilevel disc space narrowing and endplate spur formation. Vertebral body heights maintained without fracture or subluxation. Multilevel facet degenerative changes bilaterally. Visualized skullbase intact. Lung apices clear. IMPRESSION: Atrophy with small vessel chronic ischemic changes of deep cerebral white matter. Note definite acute intracranial abnormalities identified on exam limited by patient motion. Multilevel degenerative disc and facet disease changes cervical spine. No acute cervical spine abnormalities. Electronically Signed   By: Lavonia Dana M.D.   On: 03/08/2015 14:38    MDM  CT brain of head and cervical spine obtained as patient fell striking her head yesterday and had syncopal event today. With history of dementia unable to assess clinically for head trauma Final diagnoses:  None   Patient felt to be dehydrated.Syncope likely secondary to dehydration Acute kidney injury likely secondary to dehydration. Consulted with with Dr.Merrill will arrange for inpatient stay. Dx #1 syncope  #2 acute kidney injury  #3 hyponatremia #4 dehydration #5 diarrhea #6 anemia      Orlie Dakin, MD 03/08/15 [phone]

## 2015-03-08 NOTE — H&P (Signed)
[facility] History and Physical  [name] [id number] DOB: 02-14-1932 DOA: 03/08/2015  Referring physician: Dr Rogene Houston PCP: Sheela Stack, MD   Chief Complaint: syncope  HPI: Brianna [name] is a 82 y.o. female  5 caveat, the patient presenting in altered mental state. History provided by EDP, and patient's daughter. Patient's daughter is her caregiver. Per report patient had a syncopal episode today that lasted approximately 2-3 minutes. This was preceded by lightheadedness . Increasing generalized weakness over the last several days. Daughter states the patient is become so weak that she has needed to be pushed around in a chair. Patient was sitting in her chair when she had her syncopal event. Patient is also had multiple episodes of diarrhea that were nonbloody over the last 2 days. Patient was started on an antibiotic 2 weeks ago for UTI. Patient also reports a mechanical fall 1 day ago at which time she struck her head. Denies any dysuria, back pain, chest pain, short of breath, palpitations, rash, fevers, cough, congestion, shortness of breath.  pts only complaint at this time is "I feel bad."  Review of Systems:  Unable to obtain further   Past Medical History  Diagnosis Date  . DUB (dysfunctional uterine bleeding)   . Thyroid disease     Hypothyroid  . Hypertension   . Dementia   . Elevated cholesterol   . Kidney stone   . Hyperlipidemia   . Depression with anxiety   . Sleep apnea   . Adenomatous colon polyp    Past Surgical History  Procedure Laterality Date  . Cholecystectomy    . Kidney stones    . Colon tumor-benign    . Abdominal hysterectomy    . Appendectomy     Social History:  reports that she has never smoked. She has never used smokeless tobacco. She reports that she does not drink alcohol or use illicit drugs.  Allergies  Allergen Reactions  . Penicillins Other (See Comments)    Has patient had a PCN reaction causing immediate  rash, facial/tongue/throat swelling, SOB or lightheadedness with hypotension:  Has patient had a PCN reaction causing severe rash involving mucus membranes or skin necrosis:  Has patient had a PCN reaction that required hospitalization  Has patient had a PCN reaction occurring within the last 10 years:  If all of the above answers are "NO", then may proceed with Cephalosporin use.   Family History  Problem Relation Age of Onset  . Hypertension Brother   . Heart disease Brother   . Diabetes type II Brother   . Diabetes type II Sister   . Heart disease Father   . Diabetes Mother   . Thyroid disease Child   . Diabetes Child      Prior to Admission medications   Medication Sig Start Date End Date Taking? Authorizing Provider  esomeprazole (NEXIUM) 40 MG capsule Take 40 mg by mouth daily at 12 noon.   Yes Historical Provider, MD  galantamine (RAZADYNE ER) 16 MG 24 hr capsule TAKE ONE CAPSULE BY MOUTH EVERY DAY WITH BREAKFAST 10/11/14  Yes Larey Seat, MD  iron polysaccharides (NU-IRON) 150 MG capsule Take 150-300 mg by mouth 3 (three) times daily. Take 1 capsule in the morning Take 2 capsules at lunch & dinner 06/02/12  Yes Historical Provider, MD  levothyroxine (SYNTHROID, LEVOTHROID) 125 MCG tablet Take 125 mcg by mouth daily before breakfast.   Yes Historical Provider, MD  nisoldipine (SULAR) 17 MG 24 hr tablet Take 17  mg by mouth daily.  03/13/09  Yes Historical Provider, MD  PRISTIQ 50 MG 24 hr tablet Take 50 mg by mouth daily.  04/05/13  Yes Historical Provider, MD  traZODone (DESYREL) 50 MG tablet Take 25 mg by mouth at bedtime.  02/16/13  Yes Historical Provider, MD  valsartan (DIOVAN) 320 MG tablet Take 320 mg by mouth daily.   Yes Historical Provider, MD  Vitamin D, Ergocalciferol, (DRISDOL) 50000 UNITS CAPS capsule Take 50,000 Units by mouth every 7 (seven) days. Tuesday   Yes Historical Provider, MD  ciprofloxacin (CIPRO) 500 MG tablet TAKE ONE TABLET BY MOUTH TWICE DAILY X 7 DAYS  02/15/15   Historical Provider, MD  colchicine 0.6 MG tablet Take 0.6 mg by mouth daily as needed. For gout 02/15/15   Historical Provider, MD  Dexlansoprazole 30 MG capsule Take 1 capsule (30 mg total) by mouth daily. Patient not taking: Reported on 03/08/2015 10/11/14   Larey Seat, MD   Physical Exam: Filed Vitals:   03/08/15 1230 03/08/15 1345 03/08/15 1430 03/08/15 1534  BP: 102/61 101/48 105/49 106/58  Pulse: 68 66 82 68  Temp:      Resp: 19 24 17 25   SpO2: 99% 98% 98% 100%    Wt Readings from Last 3 Encounters:  02/20/15 72.122 kg (159 lb)  10/11/14 79.379 kg (175 lb)  04/08/14 68.947 kg (152 lb)    General:  Appears calm and comfortable Eyes:  PERRL, EOMI, normal lids, iris ENT:  grossly normal hearing, lips & tongue Neck:  no LAD, masses or thyromegaly Cardiovascular:  RRR, no m/r/g. No LE edema.  Respiratory:  CTA bilaterally, no w/r/r. Normal respiratory effort. Abdomen:  soft, ntnd Skin:  no rash or induration seen on limited exam Musculoskeletal:  grossly normal tone BUE/BLE Psychiatric:  AOX0. Follows basic commands Neurologic:  CN 2-12 grossly intact, moves all extremities in coordinated fashion.          Labs on Admission:  Basic Metabolic Panel:  Recent Labs Lab 03/08/15 1228  NA 126*  K 4.8  CL 97*  CO2 14*  GLUCOSE 96  BUN 58*  CREATININE 2.36*  CALCIUM 9.3   Liver Function Tests: No results for input(s): AST, ALT, ALKPHOS, BILITOT, PROT, ALBUMIN in the last 168 hours. No results for input(s): LIPASE, AMYLASE in the last 168 hours. No results for input(s): AMMONIA in the last 168 hours. CBC:  Recent Labs Lab 03/08/15 1228  WBC 9.0  HGB 9.2*  HCT 28.4*  MCV 75.5*  PLT 414*   Cardiac Enzymes: No results for input(s): CKTOTAL, CKMB, CKMBINDEX, TROPONINI in the last 168 hours.  BNP (last 3 results) No results for input(s): BNP in the last 8760 hours.  ProBNP (last 3 results) No results for input(s): PROBNP in the last 8760  hours.   Creatinine clearance cannot be calculated (Unknown ideal weight.)  CBG: No results for input(s): GLUCAP in the last 168 hours.  Radiological Exams on Admission: Ct Head Wo Contrast  03/08/2015  CLINICAL DATA:  Witnessed fall on 03/07/2015, refused transport by EMS, no loss of consciousness, fell again today, agitation, history hypertension, dementia, hyperlipidemia EXAM: CT HEAD WITHOUT CONTRAST CT CERVICAL SPINE WITHOUT CONTRAST TECHNIQUE: Multidetector CT imaging of the head and cervical spine was performed following the standard protocol without intravenous contrast. Multiplanar CT image reconstructions of the cervical spine were also generated. COMPARISON:  CT head 02/04/2014, CT cervical spine 11/19/2013 FINDINGS: CT HEAD FINDINGS Exam degraded by motion artifacts. Generalized atrophy. Normal ventricular  morphology. No midline shift or mass effect. Small vessel chronic ischemic changes of deep cerebral white matter. No gross intracranial hemorrhage, mass lesion, or acute infarction. No extra-axial fluid collections. Visualized paranasal sinuses and mastoid air cells clear. Bones unremarkable. CT CERVICAL SPINE FINDINGS Prevertebral soft tissues normal thickness. Beam hardening artifacts from the shoulders. Multilevel disc space narrowing and endplate spur formation. Vertebral body heights maintained without fracture or subluxation. Multilevel facet degenerative changes bilaterally. Visualized skullbase intact. Lung apices clear. IMPRESSION: Atrophy with small vessel chronic ischemic changes of deep cerebral white matter. Note definite acute intracranial abnormalities identified on exam limited by patient motion. Multilevel degenerative disc and facet disease changes cervical spine. No acute cervical spine abnormalities. Electronically Signed   By: Lavonia Dana M.D.   On: 03/08/2015 14:38   Ct Cervical Spine Wo Contrast  03/08/2015  CLINICAL DATA:  Witnessed fall on 03/07/2015, refused  transport by EMS, no loss of consciousness, fell again today, agitation, history hypertension, dementia, hyperlipidemia EXAM: CT HEAD WITHOUT CONTRAST CT CERVICAL SPINE WITHOUT CONTRAST TECHNIQUE: Multidetector CT imaging of the head and cervical spine was performed following the standard protocol without intravenous contrast. Multiplanar CT image reconstructions of the cervical spine were also generated. COMPARISON:  CT head 02/04/2014, CT cervical spine 11/19/2013 FINDINGS: CT HEAD FINDINGS Exam degraded by motion artifacts. Generalized atrophy. Normal ventricular morphology. No midline shift or mass effect. Small vessel chronic ischemic changes of deep cerebral white matter. No gross intracranial hemorrhage, mass lesion, or acute infarction. No extra-axial fluid collections. Visualized paranasal sinuses and mastoid air cells clear. Bones unremarkable. CT CERVICAL SPINE FINDINGS Prevertebral soft tissues normal thickness. Beam hardening artifacts from the shoulders. Multilevel disc space narrowing and endplate spur formation. Vertebral body heights maintained without fracture or subluxation. Multilevel facet degenerative changes bilaterally. Visualized skullbase intact. Lung apices clear. IMPRESSION: Atrophy with small vessel chronic ischemic changes of deep cerebral white matter. Note definite acute intracranial abnormalities identified on exam limited by patient motion. Multilevel degenerative disc and facet disease changes cervical spine. No acute cervical spine abnormalities. Electronically Signed   By: Lavonia Dana M.D.   On: 03/08/2015 14:38     Assessment/Plan Active Problems:   Hypertension   Dementia   Hyponatremia   Weakness   Diarrhea   Syncope   Physical deconditioning   Syncope: Suspected multifocal etiology including dehydration, orthostasis, and possibly arrhythmia. Struck head 1 day ago. Todays incident occurred while pt sitting in chair. CT head and Cspine negative.  - Telemetry -  UA pending - Echo, CXR  AK I: Creatinine 2.3. Baseline 1. Suspect dehydration from GI loss and poor nutrition - IVF - BMET  Diarrhea: 2 days of severe diarrhea. Recently on ABX for UTI. No elevation in WBC, AFVSS - Cdiff - enteric precautions  Physical deconditioning and Dementia: Unable to care for self. Worsening. Suspect pt will need in home health care vs SNF.  - PT/OT - nutrition consult - CSW - continue galantamine - continue effexor  HTN:  continue avapro, Sular  Hyponatremia: 126. Chronic per patient's caregiver though the last several readings here at the hospital were 135. Again suspect poor nutrition and GI loss - IVF normal saline - BMET Q12  Anemia: Hgb 9.2 on admission. Microcytic.  FOBT neg. Per daughter pt on iron for anemia and baseline ~10. - Anemia panel - suspect will need iron infusion  Hypothyroid: - continue synthroid  GERD: - continue ppi  Code Status: FULL  DVT Prophylaxis: Hep Family Communication: daughter Disposition  Plan: Pending Improvement    MERRELL, DAVID Lenna Sciara, MD Family Medicine [facility] [email or web address] Password [email or web address]

## 2015-03-09 DIAGNOSIS — R55 Syncope and collapse: Secondary | ICD-10-CM

## 2015-03-09 DIAGNOSIS — R531 Weakness: Secondary | ICD-10-CM

## 2015-03-09 DIAGNOSIS — R5381 Other malaise: Secondary | ICD-10-CM

## 2015-03-09 DIAGNOSIS — I10 Essential (primary) hypertension: Secondary | ICD-10-CM

## 2015-03-09 DIAGNOSIS — E871 Hypo-osmolality and hyponatremia: Secondary | ICD-10-CM

## 2015-03-09 DIAGNOSIS — R197 Diarrhea, unspecified: Secondary | ICD-10-CM

## 2015-03-09 DIAGNOSIS — F0390 Unspecified dementia without behavioral disturbance: Secondary | ICD-10-CM

## 2015-03-09 LAB — COMPREHENSIVE METABOLIC PANEL
ALK PHOS: 104 U/L (ref 38–126)
ALT: 18 U/L (ref 14–54)
AST: 31 U/L (ref 15–41)
Albumin: 2.2 g/dL — ABNORMAL LOW (ref 3.5–5.0)
Anion gap: 15 (ref 5–15)
BILIRUBIN TOTAL: 1.2 mg/dL (ref 0.3–1.2)
BUN: 45 mg/dL — ABNORMAL HIGH (ref 6–20)
CHLORIDE: 105 mmol/L (ref 101–111)
CO2: 13 mmol/L — AB (ref 22–32)
Calcium: 8.7 mg/dL — ABNORMAL LOW (ref 8.9–10.3)
Creatinine, Ser: 1.52 mg/dL — ABNORMAL HIGH (ref 0.44–1.00)
GFR calc non Af Amer: 31 mL/min — ABNORMAL LOW (ref >60)
GFR, EST AFRICAN AMERICAN: 36 mL/min — AB (ref >60)
Glucose, Bld: 93 mg/dL (ref 65–99)
POTASSIUM: 4.2 mmol/L (ref 3.5–5.1)
SODIUM: 133 mmol/L — AB (ref 135–145)
Total Protein: 6.2 g/dL — ABNORMAL LOW (ref 6.5–8.1)

## 2015-03-09 LAB — CBC
HCT: 25.9 % — ABNORMAL LOW (ref 36.0–46.0)
Hemoglobin: 8.9 g/dL — ABNORMAL LOW (ref 12.0–15.0)
MCH: 25.8 pg — ABNORMAL LOW (ref 26.0–34.0)
MCHC: 34.4 g/dL (ref 30.0–36.0)
MCV: 75.1 fL — ABNORMAL LOW (ref 78.0–100.0)
Platelets: 380 10*3/uL (ref 150–400)
RBC: 3.45 MIL/uL — AB (ref 3.87–5.11)
RDW: 15.1 % (ref 11.5–15.5)
WBC: 8.3 10*3/uL (ref 4.0–10.5)

## 2015-03-09 LAB — C DIFFICILE QUICK SCREEN W PCR REFLEX
C DIFFICILE (CDIFF) TOXIN: NEGATIVE
C DIFFICLE (CDIFF) ANTIGEN: NEGATIVE
C Diff interpretation: NEGATIVE

## 2015-03-09 LAB — TSH: TSH: 1.771 u[IU]/mL (ref 0.350–4.500)

## 2015-03-09 LAB — GLUCOSE, CAPILLARY: Glucose-Capillary: 92 mg/dL (ref 65–99)

## 2015-03-09 MED ORDER — SODIUM CHLORIDE 0.9 % IV SOLN
510.0000 mg | Freq: Once | INTRAVENOUS | Status: DC
  Filled 2015-03-09: qty 17

## 2015-03-09 MED ORDER — POLYSACCHARIDE IRON COMPLEX 150 MG PO CAPS
150.0000 mg | ORAL_CAPSULE | Freq: Every day | ORAL | Status: DC
  Administered 2015-03-10 – 2015-03-11 (×2): 150 mg via ORAL
  Filled 2015-03-09 (×3): qty 1

## 2015-03-09 MED ORDER — GALANTAMINE HYDROBROMIDE ER 8 MG PO CP24
16.0000 mg | ORAL_CAPSULE | Freq: Every day | ORAL | Status: DC
  Administered 2015-03-09 – 2015-03-11 (×3): 16 mg via ORAL
  Filled 2015-03-09 (×5): qty 2

## 2015-03-09 MED ORDER — LEVOTHYROXINE SODIUM 25 MCG PO TABS
125.0000 ug | ORAL_TABLET | Freq: Every day | ORAL | Status: DC
  Administered 2015-03-09 – 2015-03-11 (×3): 125 ug via ORAL
  Filled 2015-03-09 (×4): qty 1

## 2015-03-09 MED ORDER — POLYSACCHARIDE IRON COMPLEX 150 MG PO CAPS
300.0000 mg | ORAL_CAPSULE | Freq: Two times a day (BID) | ORAL | Status: DC
  Administered 2015-03-09 – 2015-03-11 (×5): 300 mg via ORAL
  Filled 2015-03-09 (×5): qty 2

## 2015-03-09 MED ORDER — ENSURE ENLIVE PO LIQD: 237.0000 mL | Freq: Three times a day (TID) | ORAL | Status: DC | PRN

## 2015-03-09 MED ORDER — CYANOCOBALAMIN 1000 MCG/ML IJ SOLN
1000.0000 ug | Freq: Once | INTRAMUSCULAR | Status: AC
  Administered 2015-03-09: 1000 ug via INTRAMUSCULAR
  Filled 2015-03-09: qty 1

## 2015-03-09 MED ORDER — ADULT MULTIVITAMIN W/MINERALS CH
1.0000 | ORAL_TABLET | Freq: Every day | ORAL | Status: DC
  Administered 2015-03-10 – 2015-03-11 (×2): 1 via ORAL
  Filled 2015-03-09 (×3): qty 1

## 2015-03-09 NOTE — Progress Notes (Signed)
Utilization review completed. Evely Gainey, RN, BSN. 

## 2015-03-09 NOTE — [place] FL2 (Signed)
[place] MEDICAID FL2 LEVEL OF CARE SCREENING TOOL     IDENTIFICATION  Patient Name: Brianna [name] Birthdate: Mar 18, 1932 Sex: female Admission Date (Current Location): 03/08/2015  [facility] and [place] Number:  Herbalist and Address:  [facility], [place] [address] [place], [place] [address]      Provider Number:    Attending Physician Name and Address:  Cristal Ford, DO  Relative Name and Phone Number:     [name] (daughter) [phone]  Current Level of Care: Hospital Recommended Level of Care: [place] Prior Approval Number:    Date Approved/Denied:   PASRR Number:  [id number]  Discharge Plan: SNF    Current Diagnoses: Patient Active Problem List   Diagnosis Date Noted  . Syncope 03/08/2015  . Physical deconditioning 03/08/2015  . Alzheimer's dementia without behavioral disturbance 10/11/2014  . Anemia 10/26/2013  . Diarrhea 10/26/2013  . Near syncope 09/06/2013  . Hypothyroid 09/06/2013  . Hyponatremia 02/13/2013  . Weakness 02/13/2013  . 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 Hypersomnia with sleep apnea, unspecified 11/17/2012  . DUB (dysfunctional uterine bleeding)   . Hypertension   . Dementia   . Elevated cholesterol   . Kidney stone     Orientation RESPIRATION BLADDER Height & Weight     Self  Normal Continent Weight: 149 lb 0.5 oz (67.6 kg) Height:  4\' 11"  (149.9 cm)  BEHAVIORAL SYMPTOMS/MOOD NEUROLOGICAL BOWEL NUTRITION STATUS      Continent    AMBULATORY STATUS COMMUNICATION OF NEEDS Skin   Limited Assist Verbally Normal                       Personal Care Assistance Level of Assistance  Bathing, Feeding, Dressing Bathing Assistance: Maximum assistance Feeding assistance: Limited assistance Dressing Assistance: Maximum assistance     Functional Limitations Info  Sight, Hearing, Speech Sight Info: Adequate Hearing Info:  Adequate Speech Info: Adequate    SPECIAL CARE FACTORS FREQUENCY  PT (By licensed PT), OT (By licensed OT)     PT Frequency: 5x OT Frequency: 5x            Contractures      Additional Factors Info  Code Status, Allergies Code Status Info: full Allergies Info: Penicillins           Current Medications (03/09/2015):  This is the current hospital active medication list Current Facility-Administered Medications  Medication Dose Route Frequency Provider Last Rate Last Dose  . 0.9 %  sodium chloride infusion   Intravenous Continuous Waldemar Dickens, MD 100 mL/hr at 03/09/15 0247    . cyanocobalamin ((VITAMIN B-12)) injection 1,000 mcg  1,000 mcg Intramuscular Once [employer], DO      . feeding supplement (ENSURE ENLIVE) (ENSURE ENLIVE) liquid 237 mL  237 mL Oral TID PRN Lorenda Peck, RD      . ferumoxytol (FERAHEME) 510 mg in sodium chloride 0.9 % 100 mL IVPB  510 mg Intravenous Once [employer], DO      . galantamine (RAZADYNE ER) 24 hr capsule 16 mg  16 mg Oral Q breakfast Maryann Mikhail, DO   16 mg at 03/09/15 1207  . heparin injection 5,000 Units  5,000 Units Subcutaneous 3 times per day Waldemar Dickens, MD   5,000 Units at 03/09/15 0700  . irbesartan (AVAPRO) tablet 300 mg  300 mg Oral Daily Waldemar Dickens, MD   300 mg at  03/09/15 1025  . [START ON 03/10/2015] iron polysaccharides (NIFEREX) capsule 150 mg  150 mg Oral Daily Maryann Mikhail, DO      . iron polysaccharides (NIFEREX) capsule 300 mg  300 mg Oral BID AC Maryann Mikhail, DO   300 mg at 03/09/15 1207  . levothyroxine (SYNTHROID, LEVOTHROID) tablet 125 mcg  125 mcg Oral QAC breakfast Waldemar Dickens, MD   125 mcg at 03/09/15 0700  . multivitamin with minerals tablet 1 tablet  1 tablet Oral Daily Maryann Mikhail, DO      . nisoldipine (SULAR) 24 hr tablet 17 mg  17 mg Oral Daily Waldemar Dickens, MD   17 mg at 03/09/15 1025  . ondansetron (ZOFRAN) tablet 4 mg  4 mg Oral Q6H PRN Waldemar Dickens, MD       Or   . ondansetron [facility]) injection 4 mg  4 mg Intravenous Q6H PRN Waldemar Dickens, MD      . pantoprazole (PROTONIX) EC tablet 80 mg  80 mg Oral Q1200 Waldemar Dickens, MD   80 mg at 03/09/15 1207  . sodium chloride flush (NS) 0.9 % injection 3 mL  3 mL Intravenous Q12H Waldemar Dickens, MD   3 mL at 03/08/15 2200  . traZODone (DESYREL) tablet 25 mg  25 mg Oral QHS Waldemar Dickens, MD   25 mg at 03/08/15 2200  . venlafaxine XR (EFFEXOR-XR) 24 hr capsule 75 mg  75 mg Oral Q breakfast Waldemar Dickens, MD   75 mg at 03/09/15 0900     Discharge Medications: Please see discharge summary for a list of discharge medications.  Relevant Imaging Results:  Relevant Lab Results:   Additional Information SS# [id number]  Minta Balsam  BSW intern  [phone]

## 2015-03-09 NOTE — Progress Notes (Signed)
Initial Nutrition Assessment  DOCUMENTATION CODES:   Obesity unspecified  INTERVENTION:  Provide Ensure Enlive po PRN, each supplement provides 350 kcal and 20 grams of protein  Provide Multivitamin with minerals daily  Add Dysphagia 3 to diet order  NUTRITION DIAGNOSIS:   Predicted suboptimal nutrient intake related to lethargy/confusion as evidenced by meal completion < 50%, per patient/family report.   GOAL:   Patient will meet greater than or equal to 90% of their needs   MONITOR:   PO intake, Labs, Weight trends, Skin  REASON FOR ASSESSMENT:   Consult Assessment of nutrition requirement/status  ASSESSMENT:   82 year old female with history of dementia, HTN, HLD, and depression presents in altered mental state. History provided by EDP, and patient's daughter. Patient's daughter is her caregiver. Per report patient had a syncopal episode that lasted approximately 2-3 minutes. Increasing generalized weakness over the last several days. Patient is also had multiple episodes of diarrhea that were nonbloody over the last 2 days. Patient was started on an antibiotic 2 weeks ago for UTI. Patient also reports a mechanical fall 1 day PTA at which time she struck her head.  Pt reports having a good appetite, but unable to provide any history. Pt's daughter at bedside states that pt has been eating about 50% less than usual for the past 3 days, but previously was eating very well. Daughter provides pt Ensure when she does not eat well. Per daughter, pt usually weighs 159 lbs; suspects weight loss is related to patient having diarrhea for the past 2 days. Per nursing notes, pt declined dinner last night. Daughter reports pt ate 2 yogurts for breakfast this morning. Daughter reports that patient left bottom dentures at home and will need soft foods.   Labs: low hemoglobin, low sodium, low calcium, low iron, low TIBC  Diet Order:  Diet Heart Room service appropriate?: Yes; Fluid  consistency:: Thin  Skin:  Reviewed, no issues  Last BM:  2/8  Height:   Ht Readings from Last 1 Encounters:  03/08/15 4\' 11"  (1.499 m)    Weight:   Wt Readings from Last 1 Encounters:  03/09/15 149 lb 0.5 oz (67.6 kg)    Ideal Body Weight:  44.7 kg  BMI:  Body mass index is 30.08 kg/(m^2).  Estimated Nutritional Needs:   Kcal:  1350-1550  Protein:  60-70 grams  Fluid:  1.5 L/day  EDUCATION NEEDS:   No education needs identified at this time  [place], LDN Inpatient Clinical Dietitian Pager: [phone] After Hours Pager: [phone]

## 2015-03-09 NOTE — Evaluation (Signed)
Occupational Therapy Evaluation Patient Details Name: SHUNTAY SCHOENWALD MRN: [id number] DOB: May 01, 1932 Today's Date: 03/09/2015    History of Present Illness Pt admitted with AMS, syncopal episode, generalized wekness, mechanical fall, and severe diarrhea.  PMH includes:  dementia, HTN,    Clinical Impression   Pt admitted with above. She demonstrates the below listed deficits and will benefit from continued OT to maximize safety and independence with BADLs.  Pt presents to OT with cognitive deficits (h/o dementia), impaired balance, generalized weakness.  She requires mod - max A for ADLs and mod A for functional mobility.  Feel she will require SNF level rehab at discharge.       Follow Up Recommendations  SNF;Supervision/Assistance - 24 hour    Equipment Recommendations  None recommended by OT    Recommendations for Other Services       Precautions / Restrictions Precautions Precautions: Fall Restrictions Weight Bearing Restrictions: No      Mobility Bed Mobility Overal bed mobility: Needs Assistance Bed Mobility: Rolling;Sidelying to Sit Rolling: Min assist;+2 for physical assistance Sidelying to sit: Mod assist       General bed mobility comments: max cues to perform task, assist to initiate. patient with poor ability to carry out task. Assist for LE movement to EOB, increased assist to elevate trunk to EOB.  Transfers Overall transfer level: Needs assistance Equipment used: 1 person hand held assist Transfers: Sit to/from [employer] Sit to Stand: Min assist Stand pivot transfers: Mod assist       General transfer comment: Pt requires min A to stand, but fatigues quickly in standing, requiring mod A to maintain standing     Balance Overall balance assessment: Needs assistance Sitting-balance support: Feet supported Sitting balance-Leahy Scale: Poor Sitting balance - Comments: Requires min - mod A to maintian EOC sitting while attempting to  don socks.  Loses balance posteriorly  Postural control: Posterior lean Standing balance support: During functional activity;Single extremity supported Standing balance-Leahy Scale: Poor Standing balance comment: requires mod A to maintain balance                             ADL Overall ADL's : Needs assistance/impaired Eating/Feeding: Set up;Sitting   Grooming: Wash/dry hands;Wash/dry face;Brushing hair;Set up;Supervision/safety;Sitting   Upper Body Bathing: Minimal assitance;Sitting   Lower Body Bathing: Maximal assistance;Sit to/from stand   Upper Body Dressing : Moderate assistance;Sitting   Lower Body Dressing: Total assistance;Sit to/from stand   Toilet Transfer: Moderate assistance;Stand-pivot;BSC   Toileting- Clothing Manipulation and Hygiene: Total assistance Toileting - Clothing Manipulation Details (indicate cue type and reason): Pt incontinent of urine.  Assisted pt with peri care      Functional mobility during ADLs: Minimal assistance;Moderate assistance General ADL Comments: Pt fatigues rapidly.  She is unable to access feet for LB ADLs frequently falling backwards as she attempts to reach her feet.  Min - mod A to maintain standing balance.       Vision     Perception     Praxis      Pertinent Vitals/Pain Pain Assessment: No/denies pain     Hand Dominance Right   Extremity/Trunk Assessment Upper Extremity Assessment Upper Extremity Assessment: RUE deficits/detail;LUE deficits/detail RUE Deficits / Details: Pt with very limited shoulder ROM bil.  Pt reports this is long standing, but daughter had stepped out of the room and not available to confirm information.  LUE Deficits / Details: Pt with very limited shoulder  ROM bil.  Pt reports this is long standing, but daughter had stepped out of the room and not available to confirm information.   Lower Extremity Assessment Lower Extremity Assessment: Defer to PT evaluation        Communication Communication Communication: No difficulties   Cognition Arousal/Alertness: Awake/alert Behavior During Therapy: WFL for tasks assessed/performed Overall Cognitive Status: History of cognitive impairments - at baseline Area of Impairment: Attention;Following commands;Safety/judgement;Awareness;Problem solving   Current Attention Level: Focused   Following Commands: Follows one step commands with increased time Safety/Judgement: Decreased awareness of deficits Awareness: Intellectual Problem Solving: Slow processing;Decreased initiation;Difficulty sequencing;Requires verbal cues;Requires tactile cues     General Comments       Exercises       Shoulder Instructions      Home Living Family/patient expects to be discharged to:: Private residence Living Arrangements: Spouse/significant other Available Help at Discharge: Family Type of Home: House       Home Layout: One level     Bathroom Shower/Tub: Tub/shower unit Shower/tub characteristics: Engineer, building services: Standard     Home Equipment: TEFL teacher Comments: Daughter lives next door and provides care as needed but she states that usually patient is undependent/supervision level with husband just keeping an eye on her, the past few days the patient has been too weak to mobilize safely      Prior Functioning/Environment Level of Independence: Needs assistance  Gait / Transfers Assistance Needed: independent ADL's / Homemaking Assistance Needed: Daughter reports she assisted pt with bathing and dressing to ensure thoroughness and that clothing was donned correctly.  Pt was able to perform toilet transfers indpenedently, fix herself something to eat, etc. Did require 24 hour supervision due to cognitive deficits    Comments: daughter reports patient did not need to use any form of assistive device    OT Diagnosis: Generalized weakness;Cognitive deficits   OT Problem List: Decreased  strength;Decreased activity tolerance;Impaired balance (sitting and/or standing);Decreased cognition;Decreased safety awareness;Decreased knowledge of use of DME or AE   OT Treatment/Interventions: Self-care/ADL training;DME and/or AE instruction;Therapeutic activities;Patient/family education;Balance training    OT Goals(Current goals can be found in the care plan section) Acute Rehab OT Goals Patient Stated Goal: Per daughter, goal is for pt to return to baseline  OT Goal Formulation: With family Time For Goal Achievement: 03/23/15 Potential to Achieve Goals: Good ADL Goals Pt Will Perform Grooming: with min assist;standing Pt Will Perform Upper Body Bathing: with supervision;sitting Pt Will Perform Lower Body Bathing: with min assist;sit to/from stand Pt Will Perform Upper Body Dressing: with supervision;sitting Pt Will Perform Lower Body Dressing: with min assist;sit to/from stand Pt Will Transfer to Toilet: with min assist;ambulating;regular height toilet;bedside commode;grab bars Pt Will Perform Toileting - Clothing Manipulation and hygiene: with min assist;sit to/from stand  OT Frequency: Min 2X/week   Barriers to D/C: Decreased caregiver support          Co-evaluation              End of Session Nurse Communication: Mobility status  Activity Tolerance: Patient limited by fatigue Patient left: in chair;with call bell/phone within reach;with chair alarm set   Time: 1308-6578 OT Time Calculation (min): 24 min Charges:  OT General Charges $OT Visit: 1 Procedure OT Evaluation $OT Eval Moderate Complexity: 1 Procedure OT Treatments $Self Care/Home Management : 8-22 mins G-Codes:    Veronnica Hennings M 03/15/2015, 11:43 AM

## 2015-03-09 NOTE — Clinical Social Work Note (Signed)
Clinical Social Work Assessment  Patient Details  Name: Brianna [name] MRN: [id number] Date of Birth: 02-10-1932  Date of referral:  03/09/15               Reason for consult:  Facility Placement                Permission sought to share information with:  Facility Sport and exercise psychologist, Family Supports Permission granted to share information::  Yes, Verbal Permission Granted  Name::        Agency::     Relationship::     Contact Information:     Housing/Transportation Living arrangements for the past 2 months:  [place] of Information:  Adult Children Brianna [name]) Patient Interpreter Needed:  None Criminal Activity/Legal Involvement Pertinent to Current Situation/Hospitalization:  No - Comment as needed Significant Relationships:  Adult Children (daughter [phone])) Lives with:  Spouse Do you feel safe going back to the place where you live?  Yes Need for family participation in patient care:  Yes (Comment)  Care giving concerns:  Patient had a recent fall at home. Daughter can not care for mom due to how weak she is, daughter can not left on mom by herself and patient husband is also ill. PT recommend SNF.  Social Worker assessment / plan:  BSW intern entered patient room. Due to patient confusion BSW had to complete assessment with patient daughter Brianna [name] who was in the room near bedside. BSW intern explained PT recommendation and referral process. Patient stated how her mom recently fell at home and how weak she has been. Patient says that she can not care for mom the proper way due to her dad illness as well and how weak her mother has became she can not left and pull on her by herself. Patient daughter is agreeable to her mom going to a SNF . Patient daughter states this is in her mom best interest if she does go to SNF as long as shes able to visit and its a facility that is close by home and in [facility] . BSW intern provided patient daughter with a  list of facilities and left her supervisor name and contact information for further questions or concerns.patient daughter did state that her mom and dad live next door to her.  Employment status:  Retired Forensic scientist:  Medicare PT Recommendations:  [place] / Referral to community resources:  Acute Rehab  Patient/Family's Response to care:  Patient daughter is understanding of patient current care and need. Patient daughter is agreeable to her mom going to a SNF.  Patient/Family's Understanding of and Emotional Response to Diagnosis, Current Treatment, and Prognosis:  Patient daughter has a good insight about patient current treatment and diagnosis. She is very understanding that her mom will get better by going to a SNF.   Emotional Assessment Appearance:  Appears stated age Attitude/Demeanor/Rapport:  Unable to Assess Affect (typically observed):  Unable to Assess Orientation:  Oriented to Self Alcohol / Substance use:  Never Used Psych involvement (Current and /or in the community):  No (Comment)  Discharge Needs  Concerns to be addressed:  No discharge needs identified Readmission within the last 30 days:  No Current discharge risk:  None Barriers to Discharge:  No Barriers Identified   Council intern  [phone]

## 2015-03-09 NOTE — Progress Notes (Signed)
[facility]                                                                              Patient Demographics  Brianna [name], is a 82 y.o. female, DOB - 11-16-1932, UEA:[id number]  Admit date - 03/08/2015   Admitting Physician Ozella Rocks, MD  Outpatient Primary MD for the patient is Julian Hy, MD  LOS - 1   Chief Complaint  Patient presents with  . Loss of Consciousness      HPI on 03/08/15 by Dr. Shelly Flatten Brianna [name] is a 82 y.o. female  5 caveat, the patient presenting in altered mental state. History provided by EDP, and patient's daughter. Patient's daughter is her caregiver. Per report patient had a syncopal episode today that lasted approximately 2-3 minutes. This was preceded by lightheadedness . Increasing generalized weakness over the last several days. Daughter states the patient is become so weak that she has needed to be pushed around in a chair. Patient was sitting in her chair when she had her syncopal event. Patient is also had multiple episodes of diarrhea that were nonbloody over the last 2 days. Patient was started on an antibiotic 2 weeks ago for UTI. Patient also reports a mechanical fall 1 day ago at which time she struck her head. Denies any dysuria, back pain, chest pain, short of breath, palpitations, rash, fevers, cough, congestion, shortness of breath. pts only complaint at this time is "I feel bad."  Assessment & Plan   Syncope -Likely secondary to multifactorial causes including dehydration, orthostasis vs questionable arrhythmia -Continue telemetry monitoring -Patient did fall and her head approximately 2 days prior to coming into the hospital -CT head and C-spine negative -Chest x-ray showed old rib fractures otherwise unremarkable -Echocardiogram pending -PT consulted and recommended SNF -Unable to obtain orthostatic vitals this patient is too weak to get up  Acute kidney injury -Creatinine baseline 1, upon admission  2.3 -Creatinine currently 1.52 -Continue to monitor BMP  Diarrhea -Patient had several days of diarrhea prior to admission.  She was recently on antibiotics for urinary tract infection -C. difficile negative   Physical deconditioning and dementia -PT and OT consulted, recommended SNF -Continue galantamine for dementia  Hypertension -Continue solar and Avapro  Hyponatremia -Sodium upon admission 126, currently 133 -Continue monitor BMP  Microcytic Anemia, chronic -Baseline hemoglobin approximately 10, currently 8.9 (suspect drop secondary to dilutional component) -Continue iron supplementation -FOBT negative -Anemia panel: Iron 11, ferritin 5, vitamin B-12 323 -Will order Feraheme and vitamin B 12  Hypothyroidism -Continue Synthroid  GERD -Continue PPI  Code Status: Full  Family Communication: Daughter at bedside  Disposition Plan: Admitted.   Time Spent in minutes   30 minutes  Procedures  None  Consults   none  DVT Prophylaxis  heparin  Lab Results  Component Value Date   PLT 380 03/09/2015    Medications  Scheduled Meds: . galantamine  16 mg Oral Q breakfast  . heparin  5,000 Units Subcutaneous 3 times per day  . irbesartan  300 mg Oral Daily  . [START ON 03/10/2015] iron polysaccharides  150 mg Oral Daily  . iron polysaccharides  300 mg Oral BID  AC  . levothyroxine  125 mcg Oral QAC breakfast  . multivitamin with minerals  1 tablet Oral Daily  . nisoldipine  17 mg Oral Daily  . pantoprazole  80 mg Oral Q1200  . sodium chloride flush  3 mL Intravenous Q12H  . traZODone  25 mg Oral QHS  . venlafaxine XR  75 mg Oral Q breakfast   Continuous Infusions: . sodium chloride 100 mL/hr at 03/09/15 0247   PRN Meds:.feeding supplement (ENSURE ENLIVE), ondansetron **OR** ondansetron (ZOFRAN) IV  Antibiotics    Anti-infectives    None      Subjective:   Marena Buckhannon seen and examined today.  Patient has no complaints of chest pain, shortness of  breath, abdominal pain, dizziness or headache. She does complain of diarrhea.  Objective:   Filed Vitals:   03/08/15 1741 03/08/15 1938 03/09/15 0453 03/09/15 0903  BP: 106/83 125/47 99/44 142/58  Pulse: 92 67 67 69  Temp: 98 F (36.7 C) 98.2 F (36.8 C) 98.3 F (36.8 C)   TempSrc: Oral Oral Oral   Resp: 20  19   Height: 4\' 11"  (1.499 m)     Weight: 70.3 kg (154 lb 15.7 oz)  67.6 kg (149 lb 0.5 oz)   SpO2: 100% 100% 100%     Wt Readings from Last 3 Encounters:  03/09/15 67.6 kg (149 lb 0.5 oz)  02/20/15 72.122 kg (159 lb)  10/11/14 79.379 kg (175 lb)     Intake/Output Summary (Last 24 hours) at 03/09/15 1339 Last data filed at 03/09/15 0100  Gross per 24 hour  Intake      0 ml  Output      0 ml  Net      0 ml    Exam  General: Well developed, well nourished, NAD, appears stated age  HEENT: NCAT, mucous membranes moist.   Cardiovascular: S1 S2 auscultated, no rubs, murmurs or gallops. Regular rate and rhythm.  Respiratory: Clear to auscultation bilaterally   Abdomen: Soft, nontender, nondistended, + bowel sounds  Extremities: warm dry without cyanosis clubbing or edema  Neuro: AAOxself.  Nonfocal  Psych: Normal affect and demeanor    Data Review   Micro Results Recent Results (from the past 240 hour(s))  C difficile quick screen w PCR reflex     Status: None   Collection Time: 03/08/15  4:28 PM  Result Value Ref Range Status   C Diff antigen NEGATIVE NEGATIVE Final   C Diff toxin NEGATIVE NEGATIVE Final   C Diff interpretation Negative for toxigenic C. difficile  Final    Radiology Reports X-ray Chest Pa And Lateral  03/08/2015  CLINICAL DATA:  Syncope.  Altered mental status for 1 day. EXAM: CHEST  2 VIEW COMPARISON:  02/04/2014 FINDINGS: The patient is rotated to the bright on today's radiograph, reducing diagnostic sensitivity and specificity. Slightly prominent right upper mediastinal tissues are attributable to rightward rotation and vascular  structures. The patient was unable to lift her arms well for the lateral projection. Also the lateral projection is somewhat oblique. No discrete pleural effusion. The lungs appear clear. Mildly tortuous thoracic aorta. Bony irregularity of the right lateral seventh and eighth ribs suggesting age-indeterminate (but probably old) rib fractures. IMPRESSION: 1. Age-indeterminate (but probably old) rib fractures of the right lateral seventh and eighth ribs. Correlate with any point tenderness or recent injury to this vicinity. No pleural effusion or pneumothorax. Otherwise negative exam. Electronically Signed   By: Gaylyn Rong M.D.   On:  03/08/2015 17:20   Ct Head Wo Contrast  03/08/2015  CLINICAL DATA:  Witnessed fall on 03/07/2015, refused transport by EMS, no loss of consciousness, fell again today, agitation, history hypertension, dementia, hyperlipidemia EXAM: CT HEAD WITHOUT CONTRAST CT CERVICAL SPINE WITHOUT CONTRAST TECHNIQUE: Multidetector CT imaging of the head and cervical spine was performed following the standard protocol without intravenous contrast. Multiplanar CT image reconstructions of the cervical spine were also generated. COMPARISON:  CT head 02/04/2014, CT cervical spine 11/19/2013 FINDINGS: CT HEAD FINDINGS Exam degraded by motion artifacts. Generalized atrophy. Normal ventricular morphology. No midline shift or mass effect. Small vessel chronic ischemic changes of deep cerebral white matter. No gross intracranial hemorrhage, mass lesion, or acute infarction. No extra-axial fluid collections. Visualized paranasal sinuses and mastoid air cells clear. Bones unremarkable. CT CERVICAL SPINE FINDINGS Prevertebral soft tissues normal thickness. Beam hardening artifacts from the shoulders. Multilevel disc space narrowing and endplate spur formation. Vertebral body heights maintained without fracture or subluxation. Multilevel facet degenerative changes bilaterally. Visualized skullbase intact.  Lung apices clear. IMPRESSION: Atrophy with small vessel chronic ischemic changes of deep cerebral white matter. Note definite acute intracranial abnormalities identified on exam limited by patient motion. Multilevel degenerative disc and facet disease changes cervical spine. No acute cervical spine abnormalities. Electronically Signed   By: Ulyses Southward M.D.   On: 03/08/2015 14:38   Ct Cervical Spine Wo Contrast  03/08/2015  CLINICAL DATA:  Witnessed fall on 03/07/2015, refused transport by EMS, no loss of consciousness, fell again today, agitation, history hypertension, dementia, hyperlipidemia EXAM: CT HEAD WITHOUT CONTRAST CT CERVICAL SPINE WITHOUT CONTRAST TECHNIQUE: Multidetector CT imaging of the head and cervical spine was performed following the standard protocol without intravenous contrast. Multiplanar CT image reconstructions of the cervical spine were also generated. COMPARISON:  CT head 02/04/2014, CT cervical spine 11/19/2013 FINDINGS: CT HEAD FINDINGS Exam degraded by motion artifacts. Generalized atrophy. Normal ventricular morphology. No midline shift or mass effect. Small vessel chronic ischemic changes of deep cerebral white matter. No gross intracranial hemorrhage, mass lesion, or acute infarction. No extra-axial fluid collections. Visualized paranasal sinuses and mastoid air cells clear. Bones unremarkable. CT CERVICAL SPINE FINDINGS Prevertebral soft tissues normal thickness. Beam hardening artifacts from the shoulders. Multilevel disc space narrowing and endplate spur formation. Vertebral body heights maintained without fracture or subluxation. Multilevel facet degenerative changes bilaterally. Visualized skullbase intact. Lung apices clear. IMPRESSION: Atrophy with small vessel chronic ischemic changes of deep cerebral white matter. Note definite acute intracranial abnormalities identified on exam limited by patient motion. Multilevel degenerative disc and facet disease changes cervical  spine. No acute cervical spine abnormalities. Electronically Signed   By: Ulyses Southward M.D.   On: 03/08/2015 14:38    CBC  Recent Labs Lab 03/08/15 1228 03/09/15 0547  WBC 9.0 8.3  HGB 9.2* 8.9*  HCT 28.4* 25.9*  PLT 414* 380  MCV 75.5* 75.1*  MCH 24.5* 25.8*  MCHC 32.4 34.4  RDW 15.1 15.1    Chemistries   Recent Labs Lab 03/08/15 1228 03/08/15 1828 03/09/15 0547  NA 126* 130* 133*  K 4.8 4.8 4.2  CL 97* 102 105  CO2 14* 13* 13*  GLUCOSE 96 102* 93  BUN 58* 55* 45*  CREATININE 2.36* 2.00* 1.52*  CALCIUM 9.3 8.8* 8.7*  MG  --  1.8  --   AST  --   --  31  ALT  --   --  18  ALKPHOS  --   --  104  BILITOT  --   --  1.2   ------------------------------------------------------------------------------------------------------------------ estimated creatinine clearance is 24.7 mL/min (by C-G formula based on Cr of 1.52). ------------------------------------------------------------------------------------------------------------------ No results for input(s): HGBA1C in the last 72 hours. ------------------------------------------------------------------------------------------------------------------ No results for input(s): CHOL, HDL, LDLCALC, TRIG, CHOLHDL, LDLDIRECT in the last 72 hours. ------------------------------------------------------------------------------------------------------------------ No results for input(s): TSH, T4TOTAL, T3FREE, THYROIDAB in the last 72 hours.  Invalid input(s): FREET3 ------------------------------------------------------------------------------------------------------------------  Recent Labs  03/08/15 1828  VITAMINB12 323  FOLATE 11.0  FERRITIN 222  TIBC 241*  IRON 11*  RETICCTPCT 1.6    Coagulation profile No results for input(s): INR, PROTIME in the last 168 hours.  No results for input(s): DDIMER in the last 72 hours.  Cardiac Enzymes No results for input(s): CKMB, TROPONINI, MYOGLOBIN in the last 168  hours.  Invalid input(s): CK ------------------------------------------------------------------------------------------------------------------ Invalid input(s): POCBNP    Javonne Louissaint D.O. on 03/09/2015 at 1:39 PM  Between 7am to 7pm - Pager - [phone]  After 7pm go to [email or web address] - password [email or web address]  And look for the night coverage person covering for me after hours  [facility] Office  [phone]

## 2015-03-09 NOTE — Clinical Social Work Placement (Signed)
   CLINICAL SOCIAL WORK PLACEMENT  NOTE  Date:  03/09/2015  Patient Details  Name: Brianna [name] MRN: [id number] Date of Birth: [birth year]  Clinical Social Work is seeking post-discharge placement for this patient at the [place] level of care (*CSW will initial, date and re-position this form in  chart as items are completed):  Yes   Patient/family provided with [place] Work Department's list of facilities offering this level of care within the geographic area requested by the patient (or if unable, by the patient's family).  Yes   Patient/family informed of their freedom to choose among providers that offer the needed level of care, that participate in Medicare, Medicaid or managed care program needed by the patient, have an available bed and are willing to accept the patient.  Yes   Patient/family informed of [place]'s ownership interest in [facility] and [facility], as well as of the fact that they are under no obligation to receive care at these facilities.  PASRR submitted to EDS on 03/09/15     PASRR number received on 03/09/15     Existing PASRR number confirmed on       FL2 transmitted to all facilities in geographic area requested by pt/family on 03/09/15     FL2 transmitted to all facilities within larger geographic area on     Patient informed that his/her managed care company has contracts with or will negotiate with certain facilities, including the following:   (yes)         Patient/family informed of bed offers received.  Patient chooses bed at       Physician recommends and patient chooses bed at      Patient to be transferred to   on  .  Patient to be transferred to facility by       Patient family notified on   of transfer.  Name of family member notified:        PHYSICIAN Please sign FL2     Additional Comment:   Patient is being faxed out  now _______________________________________________ Minta Balsam  BSW intern  [phone]

## 2015-03-09 NOTE — Clinical Social Work Placement (Deleted)
   CLINICAL SOCIAL WORK PLACEMENT  NOTE  Date:  03/09/2015  Patient Details  Name: Brianna [name] MRN: [id number] Date of Birth: 20-Aug-1932  Clinical Social Work is seeking post-discharge placement for this patient at the [place] level of care (*CSW will initial, date and re-position this form in  chart as items are completed):  Yes   Patient/family provided with [place] Work Department's list of facilities offering this level of care within the geographic area requested by the patient (or if unable, by the patient's family).  Yes   Patient/family informed of their freedom to choose among providers that offer the needed level of care, that participate in Medicare, Medicaid or managed care program needed by the patient, have an available bed and are willing to accept the patient.  Yes   Patient/family informed of [place]'s ownership interest in [facility] and [facility], as well as of the fact that they are under no obligation to receive care at these facilities.  PASRR submitted to EDS on 03/09/15     PASRR number received on 03/09/15     Existing PASRR number confirmed on       FL2 transmitted to all facilities in geographic area requested by pt/family on 03/09/15     FL2 transmitted to all facilities within larger geographic area on     Patient informed that his/her managed care company has contracts with or will negotiate with certain facilities, including the following:   (yes)         Patient/family informed of bed offers received.  Patient chooses bed at       Physician recommends and patient chooses bed at      Patient to be transferred to   on  .  Patient to be transferred to facility by       Patient family notified on   of transfer.  Name of family member notified:        PHYSICIAN Please sign FL2     Additional Comment:    _______________________________________________ Soledad Gerlach,  Student-SW 03/09/2015, 2:56 PM

## 2015-03-09 NOTE — Progress Notes (Signed)
Patient loss IV access. Several attempts made by IV team. MD notified. Per MD, okay to have no IV access for now and to offer PO fluids frequently.

## 2015-03-09 NOTE — Evaluation (Signed)
Physical Therapy Evaluation Patient Details Name: Brianna [name] MRN: [id number] DOB: 03-21-1932 Today's Date: 03/09/2015   History of Present Illness  Patient comes in s/p syncopal episodes with multiple days of diarrhea and generalized weakness.  Clinical Impression  Patient demonstrates deficits in functional mobility as indicated below. Will benefit from continued skilled PT to address deficits and maximize function. Will see as indicated and progress as tolerated.  OF NOTE: patient with incontinence of stool and bladder during session. Assisted with hygiene and pericare. Spoke with daughter at length regarding concerns for mobility and patients current functional level. Daughter stated that patient would need to be at supervision/independent levels to return home safely. Discussed ST SNF for rehabilitation. Daughter in agreement.      Follow Up Recommendations SNF;Supervision/Assistance - 24 hour    Equipment Recommendations  Other (comment) (TBD)    Recommendations for Other Services       Precautions / Restrictions Precautions Precautions: Fall Restrictions Weight Bearing Restrictions: No      Mobility  Bed Mobility Overal bed mobility: Needs Assistance Bed Mobility: Rolling;Sidelying to Sit Rolling: Min assist;+2 for physical assistance Sidelying to sit: Mod assist       General bed mobility comments: max cues to perform task, assist to initiate. patient with poor ability to carry out task. Assist for LE movement to EOB, increased assist to elevate trunk to EOB.  Transfers Overall transfer level: Needs assistance Equipment used: 2 person hand held assist Transfers: Sit to/from [facility] Sit to Stand: Mod assist Stand pivot transfers: Mod assist;+2 physical assistance          Ambulation/Gait             General Gait Details: deferred secondary to decreased activity tolerance  Stairs            Wheelchair Mobility     Modified Rankin (Stroke Patients Only)       Balance Overall balance assessment: History of Falls                                           Pertinent Vitals/Pain Pain Assessment: No/denies pain    Home Living Family/patient expects to be discharged to:: Private residence Living Arrangements: Spouse/significant other Available Help at Discharge: Family Type of Home: House       Home Layout: One level   Additional Comments: Daughter lives next door and provides care as needed but she states that usually patient is undependent/supervision level with husband just keeping an eye on her, the past few days the patient has been too weak to mobilize safely    Prior Function Level of Independence: Independent         Comments: daughter reports patient did not need to use any form of assistive device     Hand Dominance   Dominant Hand: Right    Extremity/Trunk Assessment   Upper Extremity Assessment: Defer to OT evaluation           Lower Extremity Assessment: Generalized weakness         Communication      Cognition Arousal/Alertness: Awake/alert Behavior During Therapy: Anxious;Flat affect Overall Cognitive Status: History of cognitive impairments - at baseline Area of Impairment: Attention;Following commands;Safety/judgement;Awareness;Problem solving   Current Attention Level: Focused   Following Commands: Follows one step commands with increased time Safety/Judgement: Decreased awareness of deficits Awareness: Intellectual Problem Solving:  Slow processing;Decreased initiation;Difficulty sequencing;Requires verbal cues;Requires tactile cues      General Comments General comments (skin integrity, edema, etc.): incontinent of stool during session (clean catch sample provided)_    Exercises        Assessment/Plan    PT Assessment Patient needs continued PT services  PT Diagnosis Difficulty walking;Abnormality of gait;Generalized  weakness;Acute pain   PT Problem List Decreased strength;Decreased activity tolerance;Decreased balance;Decreased mobility;Decreased coordination;Decreased cognition;Decreased safety awareness  PT Treatment Interventions DME instruction;Gait training;Functional mobility training;Therapeutic activities;Therapeutic exercise;Balance training;Patient/family education   PT Goals (Current goals can be found in the Care Plan section) Acute Rehab PT Goals Patient Stated Goal: per daughter: get back to supervision levels PT Goal Formulation: With family Time For Goal Achievement: 03/23/15    Frequency Min 2X/week   Barriers to discharge Decreased caregiver support      Co-evaluation               End of Session Equipment Utilized During Treatment: Gait belt Activity Tolerance: Patient limited by fatigue Patient left: in chair;with call bell/phone within reach;with chair alarm set;with family/visitor present Nurse Communication: Mobility status         Time: [id number] PT Time Calculation (min) (ACUTE ONLY): 26 min   Charges:   PT Evaluation $PT Eval Moderate Complexity: 1 Procedure PT Treatments $Self Care/Home Management: 8-22   PT G CodesDuncan Dull 03/10/2015, 10:37 AM Alben Deeds, PT DPT  [phone]

## 2015-03-10 ENCOUNTER — Inpatient Hospital Stay (HOSPITAL_COMMUNITY): Payer: Medicare Other

## 2015-03-10 DIAGNOSIS — R55 Syncope and collapse: Secondary | ICD-10-CM

## 2015-03-10 LAB — BASIC METABOLIC PANEL
ANION GAP: 7 (ref 5–15)
BUN: 34 mg/dL — ABNORMAL HIGH (ref 6–20)
CHLORIDE: 103 mmol/L (ref 101–111)
CO2: 19 mmol/L — AB (ref 22–32)
CREATININE: 1.39 mg/dL — AB (ref 0.44–1.00)
Calcium: 8.8 mg/dL — ABNORMAL LOW (ref 8.9–10.3)
GFR calc non Af Amer: 35 mL/min — ABNORMAL LOW (ref >60)
GFR, EST AFRICAN AMERICAN: 40 mL/min — AB (ref >60)
Glucose, Bld: 97 mg/dL (ref 65–99)
POTASSIUM: 4.5 mmol/L (ref 3.5–5.1)
SODIUM: 129 mmol/L — AB (ref 135–145)

## 2015-03-10 LAB — CBC
HEMATOCRIT: 24.7 % — AB (ref 36.0–46.0)
HEMOGLOBIN: 7.8 g/dL — AB (ref 12.0–15.0)
MCH: 23.8 pg — ABNORMAL LOW (ref 26.0–34.0)
MCHC: 31.6 g/dL (ref 30.0–36.0)
MCV: 75.3 fL — ABNORMAL LOW (ref 78.0–100.0)
Platelets: 370 10*3/uL (ref 150–400)
RBC: 3.28 MIL/uL — AB (ref 3.87–5.11)
RDW: 15.1 % (ref 11.5–15.5)
WBC: 8.1 10*3/uL (ref 4.0–10.5)

## 2015-03-10 LAB — GLUCOSE, CAPILLARY: GLUCOSE-CAPILLARY: 90 mg/dL (ref 65–99)

## 2015-03-10 NOTE — Progress Notes (Signed)
Pt refused orthostatic vitals

## 2015-03-10 NOTE — Progress Notes (Signed)
[facility]                                                                              Patient Demographics  Brianna [name], is a 82 y.o. female, DOB - April 04, 1932, [id number]  Admit date - 03/08/2015   Admitting Physician Ozella Rocks, MD  Outpatient Primary MD for the patient is Julian Hy, MD  LOS - 2   Chief Complaint  Patient presents with  . Loss of Consciousness      HPI on 03/08/15 by Dr. Shelly Flatten Brianna [name] is a 82 y.o. female  5 caveat, the patient presenting in altered mental state. History provided by EDP, and patient's daughter. Patient's daughter is her caregiver. Per report patient had a syncopal episode today that lasted approximately 2-3 minutes. This was preceded by lightheadedness . Increasing generalized weakness over the last several days. Daughter states the patient is become so weak that she has needed to be pushed around in a chair. Patient was sitting in her chair when she had her syncopal event. Patient is also had multiple episodes of diarrhea that were nonbloody over the last 2 days. Patient was started on an antibiotic 2 weeks ago for UTI. Patient also reports a mechanical fall 1 day ago at which time she struck her head. Denies any dysuria, back pain, chest pain, short of breath, palpitations, rash, fevers, cough, congestion, shortness of breath. pts only complaint at this time is "I feel bad."  Assessment & Plan   Syncope -Likely secondary to multifactorial causes including dehydration, orthostasis vs questionable arrhythmia -Continue telemetry monitoring -Patient did fall and her head approximately 2 days prior to coming into the hospital -CT head and C-spine negative -Chest x-ray showed old rib fractures otherwise unremarkable -Echocardiogram pending -PT consulted and recommended SNF -Unable to obtain orthostatic vitals this patient is too weak to get up- have reordered  Acute kidney injury -Creatinine baseline 1, upon  admission 2.3 -Creatinine currently 1.39 -Continue to monitor BMP  Diarrhea -Patient had several days of diarrhea prior to admission.  She was recently on antibiotics for urinary tract infection -C. difficile negative   Physical deconditioning and dementia -PT and OT consulted, recommended SNF -Continue galantamine for dementia  Hypertension -Continue sular and Avapro  Hyponatremia -Sodium upon admission 126, currently 129 -Continue monitor BMP  Microcytic Anemia, chronic -Baseline hemoglobin approximately 10, currently 7.8 (suspect drop secondary to dilutional component) -Continue iron supplementation -FOBT negative -Anemia panel: Iron 11, ferritin 5, vitamin B-12 323 -given dose of Feraheme and vitamin B 12  Hypothyroidism -Continue Synthroid  GERD -Continue PPI  Code Status: Full  Family Communication: Daughter at bedside  Disposition Plan: Admitted. Pending echocardiogram and SNF placement.  Time Spent in minutes   30 minutes  Procedures  None  Consults   none  DVT Prophylaxis  heparin  Lab Results  Component Value Date   PLT 370 03/10/2015    Medications  Scheduled Meds: . ferumoxytol  510 mg Intravenous Once  . galantamine  16 mg Oral Q breakfast  . heparin  5,000 Units Subcutaneous 3 times per day  . irbesartan  300 mg Oral Daily  . iron polysaccharides  150  mg Oral Daily  . iron polysaccharides  300 mg Oral BID AC  . levothyroxine  125 mcg Oral QAC breakfast  . multivitamin with minerals  1 tablet Oral Daily  . nisoldipine  17 mg Oral Daily  . pantoprazole  80 mg Oral Q1200  . sodium chloride flush  3 mL Intravenous Q12H  . traZODone  25 mg Oral QHS  . venlafaxine XR  75 mg Oral Q breakfast   Continuous Infusions:   PRN Meds:.feeding supplement (ENSURE ENLIVE), ondansetron **OR** ondansetron (ZOFRAN) IV  Antibiotics    Anti-infectives    None      Subjective:   Rashunda Minneci seen and examined today.  Patient has no complaints  of chest pain, shortness of breath, abdominal pain, dizziness or headache.   Objective:   Filed Vitals:   03/09/15 1400 03/09/15 2107 03/10/15 0636 03/10/15 1219  BP: 143/53 119/44 126/52 128/59  Pulse: 81 78 71 75  Temp: 98 F (36.7 C) 99.2 F (37.3 C) 97.7 F (36.5 C) 98.3 F (36.8 C)  TempSrc: Oral Axillary Oral Axillary  Resp: 18 20 20 18   Height:      Weight:   68.2 kg (150 lb 5.7 oz)   SpO2: 100% 98% 100% 96%    Wt Readings from Last 3 Encounters:  03/10/15 68.2 kg (150 lb 5.7 oz)  02/20/15 72.122 kg (159 lb)  10/11/14 79.379 kg (175 lb)     Intake/Output Summary (Last 24 hours) at 03/10/15 1251 Last data filed at 03/10/15 1100  Gross per 24 hour  Intake    240 ml  Output      0 ml  Net    240 ml    Exam  General: Well developed, well nourished, NAD  HEENT: NCAT, mucous membranes moist.   Cardiovascular: S1 S2 auscultated,  RRR  Respiratory: Clear to auscultation bilaterally   Abdomen: Soft, nontender, nondistended, + bowel sounds  Extremities: warm dry without cyanosis clubbing or edema  Neuro: AAOxself.  Nonfocal  Data Review   Micro Results Recent Results (from the past 240 hour(s))  C difficile quick screen w PCR reflex     Status: None   Collection Time: 03/08/15  4:28 PM  Result Value Ref Range Status   C Diff antigen NEGATIVE NEGATIVE Final   C Diff toxin NEGATIVE NEGATIVE Final   C Diff interpretation Negative for toxigenic C. difficile  Final    Radiology Reports X-ray Chest Pa And Lateral  03/08/2015  CLINICAL DATA:  Syncope.  Altered mental status for 1 day. EXAM: CHEST  2 VIEW COMPARISON:  02/04/2014 FINDINGS: The patient is rotated to the bright on today's radiograph, reducing diagnostic sensitivity and specificity. Slightly prominent right upper mediastinal tissues are attributable to rightward rotation and vascular structures. The patient was unable to lift her arms well for the lateral projection. Also the lateral projection is  somewhat oblique. No discrete pleural effusion. The lungs appear clear. Mildly tortuous thoracic aorta. Bony irregularity of the right lateral seventh and eighth ribs suggesting age-indeterminate (but probably old) rib fractures. IMPRESSION: 1. Age-indeterminate (but probably old) rib fractures of the right lateral seventh and eighth ribs. Correlate with any point tenderness or recent injury to this vicinity. No pleural effusion or pneumothorax. Otherwise negative exam. Electronically Signed   By: Gaylyn Rong M.D.   On: 03/08/2015 17:20   Ct Head Wo Contrast  03/08/2015  CLINICAL DATA:  Witnessed fall on 03/07/2015, refused transport by EMS, no loss of consciousness, fell  again today, agitation, history hypertension, dementia, hyperlipidemia EXAM: CT HEAD WITHOUT CONTRAST CT CERVICAL SPINE WITHOUT CONTRAST TECHNIQUE: Multidetector CT imaging of the head and cervical spine was performed following the standard protocol without intravenous contrast. Multiplanar CT image reconstructions of the cervical spine were also generated. COMPARISON:  CT head 02/04/2014, CT cervical spine 11/19/2013 FINDINGS: CT HEAD FINDINGS Exam degraded by motion artifacts. Generalized atrophy. Normal ventricular morphology. No midline shift or mass effect. Small vessel chronic ischemic changes of deep cerebral white matter. No gross intracranial hemorrhage, mass lesion, or acute infarction. No extra-axial fluid collections. Visualized paranasal sinuses and mastoid air cells clear. Bones unremarkable. CT CERVICAL SPINE FINDINGS Prevertebral soft tissues normal thickness. Beam hardening artifacts from the shoulders. Multilevel disc space narrowing and endplate spur formation. Vertebral body heights maintained without fracture or subluxation. Multilevel facet degenerative changes bilaterally. Visualized skullbase intact. Lung apices clear. IMPRESSION: Atrophy with small vessel chronic ischemic changes of deep cerebral white matter. Note  definite acute intracranial abnormalities identified on exam limited by patient motion. Multilevel degenerative disc and facet disease changes cervical spine. No acute cervical spine abnormalities. Electronically Signed   By: Ulyses Southward M.D.   On: 03/08/2015 14:38   Ct Cervical Spine Wo Contrast  03/08/2015  CLINICAL DATA:  Witnessed fall on 03/07/2015, refused transport by EMS, no loss of consciousness, fell again today, agitation, history hypertension, dementia, hyperlipidemia EXAM: CT HEAD WITHOUT CONTRAST CT CERVICAL SPINE WITHOUT CONTRAST TECHNIQUE: Multidetector CT imaging of the head and cervical spine was performed following the standard protocol without intravenous contrast. Multiplanar CT image reconstructions of the cervical spine were also generated. COMPARISON:  CT head 02/04/2014, CT cervical spine 11/19/2013 FINDINGS: CT HEAD FINDINGS Exam degraded by motion artifacts. Generalized atrophy. Normal ventricular morphology. No midline shift or mass effect. Small vessel chronic ischemic changes of deep cerebral white matter. No gross intracranial hemorrhage, mass lesion, or acute infarction. No extra-axial fluid collections. Visualized paranasal sinuses and mastoid air cells clear. Bones unremarkable. CT CERVICAL SPINE FINDINGS Prevertebral soft tissues normal thickness. Beam hardening artifacts from the shoulders. Multilevel disc space narrowing and endplate spur formation. Vertebral body heights maintained without fracture or subluxation. Multilevel facet degenerative changes bilaterally. Visualized skullbase intact. Lung apices clear. IMPRESSION: Atrophy with small vessel chronic ischemic changes of deep cerebral white matter. Note definite acute intracranial abnormalities identified on exam limited by patient motion. Multilevel degenerative disc and facet disease changes cervical spine. No acute cervical spine abnormalities. Electronically Signed   By: Ulyses Southward M.D.   On: 03/08/2015 14:38     CBC  Recent Labs Lab 03/08/15 1228 03/09/15 0547 03/10/15 0420  WBC 9.0 8.3 8.1  HGB 9.2* 8.9* 7.8*  HCT 28.4* 25.9* 24.7*  PLT 414* 380 370  MCV 75.5* 75.1* 75.3*  MCH 24.5* 25.8* 23.8*  MCHC 32.4 34.4 31.6  RDW 15.1 15.1 15.1    Chemistries   Recent Labs Lab 03/08/15 1228 03/08/15 1828 03/09/15 0547 03/10/15 0420  NA 126* 130* 133* 129*  K 4.8 4.8 4.2 4.5  CL 97* 102 105 103  CO2 14* 13* 13* 19*  GLUCOSE 96 102* 93 97  BUN 58* 55* 45* 34*  CREATININE 2.36* 2.00* 1.52* 1.39*  CALCIUM 9.3 8.8* 8.7* 8.8*  MG  --  1.8  --   --   AST  --   --  31  --   ALT  --   --  18  --   ALKPHOS  --   --  104  --  BILITOT  --   --  1.2  --    ------------------------------------------------------------------------------------------------------------------ estimated creatinine clearance is 27.1 mL/min (by C-G formula based on Cr of 1.39). ------------------------------------------------------------------------------------------------------------------ No results for input(s): HGBA1C in the last 72 hours. ------------------------------------------------------------------------------------------------------------------ No results for input(s): CHOL, HDL, LDLCALC, TRIG, CHOLHDL, LDLDIRECT in the last 72 hours. ------------------------------------------------------------------------------------------------------------------  Recent Labs  03/09/15 1431  TSH 1.771   ------------------------------------------------------------------------------------------------------------------  Recent Labs  03/08/15 1828  VITAMINB12 323  FOLATE 11.0  FERRITIN 222  TIBC 241*  IRON 11*  RETICCTPCT 1.6    Coagulation profile No results for input(s): INR, PROTIME in the last 168 hours.  No results for input(s): DDIMER in the last 72 hours.  Cardiac Enzymes No results for input(s): CKMB, TROPONINI, MYOGLOBIN in the last 168 hours.  Invalid input(s):  CK ------------------------------------------------------------------------------------------------------------------ Invalid input(s): POCBNP    Laronn Devonshire D.O. on 03/10/2015 at 12:51 PM  Between 7am to 7pm - Pager - [phone]  After 7pm go to [email or web address] - password [email or web address]  And look for the night coverage person covering for me after hours  [facility] Office  [phone]

## 2015-03-10 NOTE — Progress Notes (Signed)
Echocardiogram 2D Echocardiogram has been performed.  Brianna [name] 03/10/2015, 1:53 PM

## 2015-03-10 NOTE — Care Management Important Message (Signed)
Important Message  Patient Details  Name: Brianna [name] MRN: [id number] Date of Birth: 08/23/1932   Medicare Important Message Given:  Yes    Destini Cambre P Charmelle Soh 03/10/2015, 3:00 PM

## 2015-03-11 LAB — CBC
HCT: 24.5 % — ABNORMAL LOW (ref 36.0–46.0)
Hemoglobin: 7.9 g/dL — ABNORMAL LOW (ref 12.0–15.0)
MCH: 24.3 pg — AB (ref 26.0–34.0)
MCHC: 32.2 g/dL (ref 30.0–36.0)
MCV: 75.4 fL — ABNORMAL LOW (ref 78.0–100.0)
PLATELETS: 363 10*3/uL (ref 150–400)
RBC: 3.25 MIL/uL — ABNORMAL LOW (ref 3.87–5.11)
RDW: 15.1 % (ref 11.5–15.5)
WBC: 7.1 10*3/uL (ref 4.0–10.5)

## 2015-03-11 LAB — BASIC METABOLIC PANEL
Anion gap: 9 (ref 5–15)
BUN: 22 mg/dL — AB (ref 6–20)
CALCIUM: 8.8 mg/dL — AB (ref 8.9–10.3)
CHLORIDE: 102 mmol/L (ref 101–111)
CO2: 19 mmol/L — ABNORMAL LOW (ref 22–32)
CREATININE: 1.18 mg/dL — AB (ref 0.44–1.00)
GFR, EST AFRICAN AMERICAN: 49 mL/min — AB (ref >60)
GFR, EST NON AFRICAN AMERICAN: 42 mL/min — AB (ref >60)
Glucose, Bld: 105 mg/dL — ABNORMAL HIGH (ref 65–99)
Potassium: 4.3 mmol/L (ref 3.5–5.1)
SODIUM: 130 mmol/L — AB (ref 135–145)

## 2015-03-11 LAB — GLUCOSE, CAPILLARY: GLUCOSE-CAPILLARY: 111 mg/dL — AB (ref 65–99)

## 2015-03-11 MED ORDER — ADULT MULTIVITAMIN W/MINERALS CH: 1.0000 | ORAL_TABLET | Freq: Every day | ORAL | Status: DC

## 2015-03-11 MED ORDER — ENSURE ENLIVE PO LIQD: 237.0000 mL | Freq: Three times a day (TID) | ORAL | Status: DC | PRN

## 2015-03-11 NOTE — Progress Notes (Signed)
Pt transferred to:  [facility] Anticipated date of transfer:  03/11/15 Transported by: Ambulance ([facility]) Time Tentatively Scheduled for:  4:00 PM Family notified: Daughter:  [name] Report # given to Nursing to call report and DC summary sent to facility for review.  Patient was noted to be very irritable this afternoon and stated "I don't want to do therapy; I don't want to do anything."  Daughter stated that this is not usual for her.  Bed is available per facility. CSW signing off.  Kendell Bane, LCSW [phone]

## 2015-03-11 NOTE — Discharge Instructions (Signed)

## 2015-03-11 NOTE — Discharge Summary (Signed)
Physician Discharge Summary  Brianna [name]:[id number] DOB: Dec 09, 1932 DOA: 03/08/2015  PCP: Julian Hy, MD  Admit date: 03/08/2015 Discharge date: 03/11/2015  Time spent: 45 minutes  Recommendations for Outpatient Follow-up:  Patient will be discharged to [facility] nursing facility.  Continue physical and occupational therapy as recommended by the rehab facility.  Patient will need to follow up with primary care provider within one week of discharge, repeat CBC and BMP.  Patient should continue medications as prescribed.  Patient should follow a heart healthy diet.   Discharge Diagnoses:  Syncope Acute kidney injury Diarrhea Physical deconditioning and dementia Hypertension  hyponatremia Microcytic anemia Hypothyroidism GERD  Discharge Condition: Stable  Diet recommendation: Heart healthy  Filed Weights   03/09/15 0453 03/10/15 0636 03/11/15 0602  Weight: 67.6 kg (149 lb 0.5 oz) 68.2 kg (150 lb 5.7 oz) 67.93 kg (149 lb 12.1 oz)    History of present illness:  on 03/08/15 by Dr. Colonel [name] is a 82 y.o. female  5 caveat, the patient presenting in altered mental state. History provided by EDP, and patient's daughter. Patient's daughter is her caregiver. Per report patient had a syncopal episode today that lasted approximately 2-3 minutes. This was preceded by lightheadedness . Increasing generalized weakness over the last several days. Daughter states the patient is become so weak that she has needed to be pushed around in a chair. Patient was sitting in her chair when she had her syncopal event. Patient is also had multiple episodes of diarrhea that were nonbloody over the last 2 days. Patient was started on an antibiotic 2 weeks ago for UTI. Patient also reports a mechanical fall 1 day ago at which time she struck her head. Denies any dysuria, back pain, chest pain, short of breath, palpitations, rash, fevers, cough, congestion, shortness of breath. pts  only complaint at this time is "I feel bad."  Hospital Course:  Syncope -Likely secondary to multifactorial causes including dehydration, orthostasis vs questionable arrhythmia -Continue telemetry monitoring -Patient did fall and her head approximately 2 days prior to coming into the hospital -CT head and C-spine negative -Chest x-ray showed old rib fractures otherwise unremarkable -Echocardiogram: EF 65-70%, grade 1 diastolic dysfunction  -PT consulted and recommended SNF -Patient refused orthostatic vitals  Acute kidney injury -Creatinine baseline 1, upon admission 2.3 -Creatinine currently 1.18 -Repeat BMP in one week  Diarrhea -Patient had several days of diarrhea prior to admission. She was recently on antibiotics for urinary tract infection -C. difficile negative  Physical deconditioning and dementia -PT and OT consulted, recommended SNF -Continue galantamine for dementia  Hypertension -Continue sular and Avapro  Hyponatremia -Sodium upon admission 126, currently 130 -Repeat BMP in one week  Microcytic Anemia, chronic -Baseline hemoglobin approximately 10, currently 7.9 (suspect drop secondary to dilutional component) -Continue iron supplementation -FOBT negative -Anemia panel: Iron 11, ferritin 5, vitamin B-12 323 -given dose of Feraheme and vitamin B 12 -Repeat CBC in one week  Hypothyroidism -Continue Synthroid  GERD -Continue PPI  Procedures  Echocardiogram  Consults  none  Discharge Exam: Filed Vitals:   03/10/15 2046 03/11/15 0602  BP: 98/35 101/58  Pulse: 77 78  Temp: 99 F (37.2 C) 98.9 F (37.2 C)  Resp: 19 18   Exam  General: Well developed, well nourished, NAD  HEENT: NCAT, mucous membranes moist.   Cardiovascular: S1 S2 auscultated, RRR  Respiratory: Clear to auscultation bilaterally  Abdomen: Soft, nontender, nondistended, + bowel sounds  Extremities: warm dry without cyanosis clubbing  or edema  Neuro: AAOxself.  Nonfocal  Discharge Instructions      Discharge Instructions    Discharge instructions    Complete by:  As directed   Patient will be discharged to [facility] nursing facility.  Continue physical and occupational therapy as recommended by the rehab facility.  Patient will need to follow up with primary care provider within one week of discharge, repeat CBC and BMP.  Patient should continue medications as prescribed.  Patient should follow a heart healthy diet.            Medication List    STOP taking these medications        ciprofloxacin 500 MG tablet  Commonly known as:  CIPRO     Dexlansoprazole 30 MG capsule      TAKE these medications        colchicine 0.6 MG tablet  Take 0.6 mg by mouth daily as needed. For gout     esomeprazole 40 MG capsule  Commonly known as:  NEXIUM  Take 40 mg by mouth daily at 12 noon.     feeding supplement (ENSURE ENLIVE) Liqd  Take 237 mLs by mouth 3 (three) times daily as needed (offer (strawberry) ensure to patient if meal completion is less than 50%).     galantamine 16 MG 24 hr capsule  Commonly known as:  RAZADYNE ER  TAKE ONE CAPSULE BY MOUTH EVERY DAY WITH BREAKFAST     levothyroxine 125 MCG tablet  Commonly known as:  SYNTHROID, LEVOTHROID  Take 125 mcg by mouth daily before breakfast.     multivitamin with minerals Tabs tablet  Take 1 tablet by mouth daily.     nisoldipine 17 MG 24 hr tablet  Commonly known as:  SULAR  Take 17 mg by mouth daily.     NU-IRON 150 MG capsule  Generic drug:  iron polysaccharides  Take 150-300 mg by mouth 3 (three) times daily. Take 1 capsule in the morning Take 2 capsules at lunch & dinner     PRISTIQ 50 MG 24 hr tablet  Generic drug:  desvenlafaxine  Take 50 mg by mouth daily.     traZODone 50 MG tablet  Commonly known as:  DESYREL  Take 25 mg by mouth at bedtime.     valsartan 320 MG tablet  Commonly known as:  DIOVAN  Take 320 mg by mouth daily.     Vitamin D (Ergocalciferol)  50000 units Caps capsule  Commonly known as:  DRISDOL  Take 50,000 Units by mouth every 7 (seven) days. Tuesday       Allergies  Allergen Reactions  . Penicillins Other (See Comments)   Follow-up Information    Follow up with Julian Hy, MD. Schedule an appointment as soon as possible for a visit in 1 week.   Specialty:  Endocrinology   Why:  Hospital follow up   Contact information:   [address] [place] [address] [phone]        The results of significant diagnostics from this hospitalization (including imaging, microbiology, ancillary and laboratory) are listed below for reference.    Significant Diagnostic Studies: X-ray Chest Pa And Lateral  03/08/2015  CLINICAL DATA:  Syncope.  Altered mental status for 1 day. EXAM: CHEST  2 VIEW COMPARISON:  02/04/2014 FINDINGS: The patient is rotated to the bright on today's radiograph, reducing diagnostic sensitivity and specificity. Slightly prominent right upper mediastinal tissues are attributable to rightward rotation and vascular structures. The patient was unable  to lift her arms well for the lateral projection. Also the lateral projection is somewhat oblique. No discrete pleural effusion. The lungs appear clear. Mildly tortuous thoracic aorta. Bony irregularity of the right lateral seventh and eighth ribs suggesting age-indeterminate (but probably old) rib fractures. IMPRESSION: 1. Age-indeterminate (but probably old) rib fractures of the right lateral seventh and eighth ribs. Correlate with any point tenderness or recent injury to this vicinity. No pleural effusion or pneumothorax. Otherwise negative exam. Electronically Signed   By: Gaylyn Rong M.D.   On: 03/08/2015 17:20   Ct Head Wo Contrast  03/08/2015  CLINICAL DATA:  Witnessed fall on 03/07/2015, refused transport by EMS, no loss of consciousness, fell again today, agitation, history hypertension, dementia, hyperlipidemia EXAM: CT HEAD WITHOUT CONTRAST CT  CERVICAL SPINE WITHOUT CONTRAST TECHNIQUE: Multidetector CT imaging of the head and cervical spine was performed following the standard protocol without intravenous contrast. Multiplanar CT image reconstructions of the cervical spine were also generated. COMPARISON:  CT head 02/04/2014, CT cervical spine 11/19/2013 FINDINGS: CT HEAD FINDINGS Exam degraded by motion artifacts. Generalized atrophy. Normal ventricular morphology. No midline shift or mass effect. Small vessel chronic ischemic changes of deep cerebral white matter. No gross intracranial hemorrhage, mass lesion, or acute infarction. No extra-axial fluid collections. Visualized paranasal sinuses and mastoid air cells clear. Bones unremarkable. CT CERVICAL SPINE FINDINGS Prevertebral soft tissues normal thickness. Beam hardening artifacts from the shoulders. Multilevel disc space narrowing and endplate spur formation. Vertebral body heights maintained without fracture or subluxation. Multilevel facet degenerative changes bilaterally. Visualized skullbase intact. Lung apices clear. IMPRESSION: Atrophy with small vessel chronic ischemic changes of deep cerebral white matter. Note definite acute intracranial abnormalities identified on exam limited by patient motion. Multilevel degenerative disc and facet disease changes cervical spine. No acute cervical spine abnormalities. Electronically Signed   By: Ulyses Southward M.D.   On: 03/08/2015 14:38   Ct Cervical Spine Wo Contrast  03/08/2015  CLINICAL DATA:  Witnessed fall on 03/07/2015, refused transport by EMS, no loss of consciousness, fell again today, agitation, history hypertension, dementia, hyperlipidemia EXAM: CT HEAD WITHOUT CONTRAST CT CERVICAL SPINE WITHOUT CONTRAST TECHNIQUE: Multidetector CT imaging of the head and cervical spine was performed following the standard protocol without intravenous contrast. Multiplanar CT image reconstructions of the cervical spine were also generated. COMPARISON:  CT  head 02/04/2014, CT cervical spine 11/19/2013 FINDINGS: CT HEAD FINDINGS Exam degraded by motion artifacts. Generalized atrophy. Normal ventricular morphology. No midline shift or mass effect. Small vessel chronic ischemic changes of deep cerebral white matter. No gross intracranial hemorrhage, mass lesion, or acute infarction. No extra-axial fluid collections. Visualized paranasal sinuses and mastoid air cells clear. Bones unremarkable. CT CERVICAL SPINE FINDINGS Prevertebral soft tissues normal thickness. Beam hardening artifacts from the shoulders. Multilevel disc space narrowing and endplate spur formation. Vertebral body heights maintained without fracture or subluxation. Multilevel facet degenerative changes bilaterally. Visualized skullbase intact. Lung apices clear. IMPRESSION: Atrophy with small vessel chronic ischemic changes of deep cerebral white matter. Note definite acute intracranial abnormalities identified on exam limited by patient motion. Multilevel degenerative disc and facet disease changes cervical spine. No acute cervical spine abnormalities. Electronically Signed   By: Ulyses Southward M.D.   On: 03/08/2015 14:38    Microbiology: Recent Results (from the past 240 hour(s))  C difficile quick screen w PCR reflex     Status: None   Collection Time: 03/08/15  4:28 PM  Result Value Ref Range Status   C Diff antigen NEGATIVE  NEGATIVE Final   C Diff toxin NEGATIVE NEGATIVE Final   C Diff interpretation Negative for toxigenic C. difficile  Final     Labs: Basic Metabolic Panel:  Recent Labs Lab 03/08/15 1228 03/08/15 1828 03/09/15 0547 03/10/15 0420 03/11/15 0406  NA 126* 130* 133* 129* 130*  K 4.8 4.8 4.2 4.5 4.3  CL 97* 102 105 103 102  CO2 14* 13* 13* 19* 19*  GLUCOSE 96 102* 93 97 105*  BUN 58* 55* 45* 34* 22*  CREATININE 2.36* 2.00* 1.52* 1.39* 1.18*  CALCIUM 9.3 8.8* 8.7* 8.8* 8.8*  MG  --  1.8  --   --   --   PHOS  --  4.2  --   --   --    Liver Function  Tests:  Recent Labs Lab 03/09/15 0547  AST 31  ALT 18  ALKPHOS 104  BILITOT 1.2  PROT 6.2*  ALBUMIN 2.2*   No results for input(s): LIPASE, AMYLASE in the last 168 hours. No results for input(s): AMMONIA in the last 168 hours. CBC:  Recent Labs Lab 03/08/15 1228 03/09/15 0547 03/10/15 0420 03/11/15 0406  WBC 9.0 8.3 8.1 7.1  HGB 9.2* 8.9* 7.8* 7.9*  HCT 28.4* 25.9* 24.7* 24.5*  MCV 75.5* 75.1* 75.3* 75.4*  PLT 414* 380 370 363   Cardiac Enzymes: No results for input(s): CKTOTAL, CKMB, CKMBINDEX, TROPONINI in the last 168 hours. BNP: BNP (last 3 results) No results for input(s): BNP in the last 8760 hours.  ProBNP (last 3 results) No results for input(s): PROBNP in the last 8760 hours.  CBG:  Recent Labs Lab 03/09/15 0929 03/10/15 0612 03/11/15 0601  GLUCAP 92 90 111*       Signed:  Haylo Fake  [facility] 03/11/2015, 10:13 AM

## 2015-03-11 NOTE — Progress Notes (Signed)
Spoke with Jennefer Bravo at [facility] and she stated she was unable to reach the DON to determine the policy for admitting patients at 2200. Ann stated, she was unaware of the admission and to hold Ms. Angeletti's discharge until in the morning. [facility] arrived to discharge the patient during my conversation with Lelon Frohlich and I relayed the information to PTAR. Daughter, Robyne Peers notified of cancelled discharge by Alinda Si[place], [place].

## 2015-03-11 NOTE — Clinical Social Work Placement (Signed)
   CLINICAL SOCIAL WORK PLACEMENT  NOTE  Date:  03/11/2015  Patient Details  Name: Brianna [name] MRN: [id number] Date of Birth: 1932/02/25  Clinical Social Work is seeking post-discharge placement for this patient at the [place] level of care (*CSW will initial, date and re-position this form in  chart as items are completed):  Yes   Patient/family provided with [place] Work Department's list of facilities offering this level of care within the geographic area requested by the patient (or if unable, by the patient's family).  Yes   Patient/family informed of their freedom to choose among providers that offer the needed level of care, that participate in Medicare, Medicaid or managed care program needed by the patient, have an available bed and are willing to accept the patient.  Yes   Patient/family informed of [place]'s ownership interest in [facility] and [facility], as well as of the fact that they are under no obligation to receive care at these facilities.  PASRR submitted to EDS on 03/09/15     PASRR number received on 03/09/15     Existing PASRR number confirmed on       FL2 transmitted to all facilities in geographic area requested by pt/family on 03/09/15     FL2 transmitted to all facilities within larger geographic area on 03/09/15     Patient informed that his/her managed care company has contracts with or will negotiate with certain facilities, including the following:   (yes)     Yes   Patient/family informed of bed offers received.  Patient chooses bed at [facility]     Physician recommends and patient chooses bed at      Patient to be transferred to [facility] on 03/11/15.  Patient to be transferred to facility by Ambulance [name])     Patient family notified on 03/11/15 of transfer.  Name of family member notified:  Daughter:  [name]     PHYSICIAN Please sign  FL2, Please prepare priority discharge summary, including medications, Please prepare prescriptions     Additional Comment:    _______________________________________________ Williemae Area, LCSW 03/11/2015, 4:25 PM

## 2015-03-12 ENCOUNTER — Other Ambulatory Visit (HOSPITAL_COMMUNITY): Payer: Medicare Other

## 2015-03-12 DIAGNOSIS — F0390 Unspecified dementia without behavioral disturbance: Secondary | ICD-10-CM | POA: Diagnosis not present

## 2015-03-12 DIAGNOSIS — G309 Alzheimer's disease, unspecified: Secondary | ICD-10-CM | POA: Diagnosis not present

## 2015-03-12 DIAGNOSIS — L22 Diaper dermatitis: Secondary | ICD-10-CM | POA: Diagnosis not present

## 2015-03-12 DIAGNOSIS — F0391 Unspecified dementia with behavioral disturbance: Secondary | ICD-10-CM | POA: Diagnosis not present

## 2015-03-12 DIAGNOSIS — E038 Other specified hypothyroidism: Secondary | ICD-10-CM | POA: Diagnosis not present

## 2015-03-12 DIAGNOSIS — E034 Atrophy of thyroid (acquired): Secondary | ICD-10-CM | POA: Diagnosis not present

## 2015-03-12 DIAGNOSIS — D509 Iron deficiency anemia, unspecified: Secondary | ICD-10-CM | POA: Diagnosis not present

## 2015-03-12 DIAGNOSIS — I10 Essential (primary) hypertension: Secondary | ICD-10-CM | POA: Diagnosis not present

## 2015-03-12 DIAGNOSIS — F329 Major depressive disorder, single episode, unspecified: Secondary | ICD-10-CM | POA: Diagnosis not present

## 2015-03-12 DIAGNOSIS — M6281 Muscle weakness (generalized): Secondary | ICD-10-CM | POA: Diagnosis not present

## 2015-03-12 DIAGNOSIS — E871 Hypo-osmolality and hyponatremia: Secondary | ICD-10-CM | POA: Diagnosis not present

## 2015-03-12 DIAGNOSIS — R55 Syncope and collapse: Secondary | ICD-10-CM | POA: Diagnosis not present

## 2015-03-12 DIAGNOSIS — N179 Acute kidney failure, unspecified: Secondary | ICD-10-CM | POA: Diagnosis not present

## 2015-03-12 DIAGNOSIS — N390 Urinary tract infection, site not specified: Secondary | ICD-10-CM | POA: Diagnosis not present

## 2015-03-12 DIAGNOSIS — D72829 Elevated white blood cell count, unspecified: Secondary | ICD-10-CM | POA: Diagnosis not present

## 2015-03-12 DIAGNOSIS — R293 Abnormal posture: Secondary | ICD-10-CM | POA: Diagnosis not present

## 2015-03-12 DIAGNOSIS — R262 Difficulty in walking, not elsewhere classified: Secondary | ICD-10-CM | POA: Diagnosis not present

## 2015-03-12 DIAGNOSIS — R488 Other symbolic dysfunctions: Secondary | ICD-10-CM | POA: Diagnosis not present

## 2015-03-12 DIAGNOSIS — G4700 Insomnia, unspecified: Secondary | ICD-10-CM | POA: Diagnosis not present

## 2015-03-12 DIAGNOSIS — R197 Diarrhea, unspecified: Secondary | ICD-10-CM | POA: Diagnosis not present

## 2015-03-12 DIAGNOSIS — D638 Anemia in other chronic diseases classified elsewhere: Secondary | ICD-10-CM | POA: Diagnosis not present

## 2015-03-12 DIAGNOSIS — K219 Gastro-esophageal reflux disease without esophagitis: Secondary | ICD-10-CM | POA: Diagnosis not present

## 2015-03-12 DIAGNOSIS — Z9181 History of falling: Secondary | ICD-10-CM | POA: Diagnosis not present

## 2015-03-12 NOTE — Progress Notes (Signed)
See full discharge summary dictated on 03/11/15.  Patient will be discharged to [facility].  Colchicine was discontinued at the request of patient's daughter.  No other changes made.  Patient is stable for discharge.    Total patient care time: 15 minutes  Tauheedah Bok D.O. [facility] Pager [phone]  If 7PM-7AM, please contact night-coverage [email or web address] Password [facility] 03/12/2015, 10:28 AM

## 2015-03-12 NOTE — Progress Notes (Signed)
Patient is discharge to [facility] with 2 transporter via Biomedical scientist through  Ambulance. All personal belongings given. Telemetry box removed prior to discharge. Patient refused all morning meds. MD ,daughter, transporter  notified.  Patient's  Stable. No apparent distress.

## 2015-03-12 NOTE — Progress Notes (Signed)
Non emergency ambulance has been arranged on behalf of pt.  Daughter informed and agreeable.  [facility] agreeable to accept.   Creta Levin, LCSW  Weekend Coverage [phone]

## 2015-03-14 ENCOUNTER — Encounter: Payer: Self-pay | Admitting: Internal Medicine

## 2015-03-14 ENCOUNTER — Non-Acute Institutional Stay (SKILLED_NURSING_FACILITY): Payer: Medicare Other | Admitting: Internal Medicine

## 2015-03-14 DIAGNOSIS — R55 Syncope and collapse: Secondary | ICD-10-CM

## 2015-03-14 DIAGNOSIS — K219 Gastro-esophageal reflux disease without esophagitis: Secondary | ICD-10-CM

## 2015-03-14 DIAGNOSIS — E038 Other specified hypothyroidism: Secondary | ICD-10-CM

## 2015-03-14 DIAGNOSIS — F0280 Dementia in other diseases classified elsewhere without behavioral disturbance: Secondary | ICD-10-CM

## 2015-03-14 DIAGNOSIS — G309 Alzheimer's disease, unspecified: Secondary | ICD-10-CM

## 2015-03-14 DIAGNOSIS — N179 Acute kidney failure, unspecified: Secondary | ICD-10-CM

## 2015-03-14 DIAGNOSIS — R197 Diarrhea, unspecified: Secondary | ICD-10-CM | POA: Diagnosis not present

## 2015-03-14 DIAGNOSIS — E871 Hypo-osmolality and hyponatremia: Secondary | ICD-10-CM

## 2015-03-14 DIAGNOSIS — L22 Diaper dermatitis: Secondary | ICD-10-CM

## 2015-03-14 DIAGNOSIS — I10 Essential (primary) hypertension: Secondary | ICD-10-CM | POA: Diagnosis not present

## 2015-03-14 DIAGNOSIS — E034 Atrophy of thyroid (acquired): Secondary | ICD-10-CM

## 2015-03-14 HISTORY — DX: Acute kidney failure, unspecified: N17.9

## 2015-03-14 NOTE — Assessment & Plan Note (Signed)
Creatinine baseline 1, upon admission 2.3 -Creatinine currently 1.18 SNF -Repeat BMP in one week

## 2015-03-14 NOTE — Assessment & Plan Note (Signed)
SNF - noted on admit;will start nystatin cream

## 2015-03-14 NOTE — Assessment & Plan Note (Signed)
SNF - TSH 1.771 ; cont synthroid 125 mcg daily

## 2015-03-14 NOTE — Assessment & Plan Note (Signed)
-  Baseline hemoglobin approximately 10, currently 7.9 (suspect drop secondary to dilutional component) -Continue iron supplementation -FOBT negative -Anemia panel: Iron 11, ferritin 5, vitamin B-12 323 -given dose of Feraheme and vitamin B 12 SNF -Repeat CBC in one week

## 2015-03-14 NOTE — Progress Notes (Signed)
MRN: [id number] Name: Brianna [name]  Sex: female Age: 82 y.o. DOB: 12-03-1932  [place] #: [facility] Facility/Room:110 Level Of Care: SNF Provider: Inocencio Homes D Emergency Contacts: Extended Emergency Contact Information Primary Emergency Contact: Ketchum,Charles Address: [address]          [place], [place] [address] Johnnette Litter of [place] Phone: [phone] Relation: Spouse Secondary Emergency Contact: Crist Infante [place] of [place] Mobile Phone: [phone] Relation: Daughter  Code Status:   Allergies: Penicillins  Chief Complaint  Patient presents with  . New Admit To SNF    HPI: Patient is 82 y.o. female who was admittted to [facility] from 2/8-11 after presenting wityh syncope lasting 2-3 minutes. Pt had been having diarhhea prior. Syncope work-up was negative but hospital course was complicated by AKI, anemia and hyponatremia. Pt is admitted to SNF for generalized weakness. While at [facility] pt will be followed for dementia, tx with galantamine, HTN, tx with sular and avapro and hypothyroidism, tx with synthroid.   Past Medical History  Diagnosis Date  . DUB (dysfunctional uterine bleeding)   . Thyroid disease     Hypothyroid  . Hypertension   . Dementia   . Elevated cholesterol   . Kidney stone   . Hyperlipidemia   . Depression with anxiety   . Sleep apnea   . Adenomatous colon polyp     Past Surgical History  Procedure Laterality Date  . Cholecystectomy    . Kidney stones    . Colon tumor-benign    . Abdominal hysterectomy    . Appendectomy        Medication List       This list is accurate as of: 03/14/15 10:51 PM.  Always use your most recent med list.               esomeprazole 40 MG capsule  Commonly known as:  NEXIUM  Take 40 mg by mouth daily at 12 noon.     feeding supplement (ENSURE ENLIVE) Liqd  Take 237 mLs by mouth 3 (three) times daily as needed (offer (strawberry) ensure to patient if meal completion is less than 50%).      galantamine 16 MG 24 hr capsule  Commonly known as:  RAZADYNE ER  TAKE ONE CAPSULE BY MOUTH EVERY DAY WITH BREAKFAST     levothyroxine 125 MCG tablet  Commonly known as:  SYNTHROID, LEVOTHROID  Take 125 mcg by mouth daily before breakfast.     multivitamin with minerals Tabs tablet  Take 1 tablet by mouth daily.     nisoldipine 17 MG 24 hr tablet  Commonly known as:  SULAR  Take 17 mg by mouth daily.     NU-IRON 150 MG capsule  Generic drug:  iron polysaccharides  Take 150-300 mg by mouth 3 (three) times daily. Take 1 capsule in the morning Take 2 capsules at lunch & dinner     PRISTIQ 50 MG 24 hr tablet  Generic drug:  desvenlafaxine  Take 50 mg by mouth daily.     traZODone 50 MG tablet  Commonly known as:  DESYREL  Take 25 mg by mouth at bedtime.     valsartan 320 MG tablet  Commonly known as:  DIOVAN  Take 320 mg by mouth daily.     Vitamin D (Ergocalciferol) 50000 units Caps capsule  Commonly known as:  DRISDOL  Take 50,000 Units by mouth every 7 (seven) days. Tuesday        No orders of  the defined types were placed in this encounter.    Immunization History  Administered Date(s) Administered  . Influenza-Unspecified 11/28/2012    Social History  Substance Use Topics  . Smoking status: Never Smoker   . Smokeless tobacco: Never Used  . Alcohol Use: No    Family history is  + DM, HD  Review of Systems  DATA OBTAINED: from nurse, 2 daughters GENERAL:  no fevers, fatigue, appetite changes SKIN: No itching, rash or wounds EYES: No eye pain, redness, discharge EARS: No earache, tinnitus, change in hearing NOSE: No congestion, drainage or bleeding  MOUTH/THROAT: No mouth or tooth pain, No sore throat RESPIRATORY: No cough, wheezing, SOB CARDIAC: No chest pain, palpitations, lower extremity edema  GI: No abdominal pain, No N/V, some diarrhea , noconstipation, No heartburn or reflux  GU: No dysuria, frequency or urgency, + , improvongincontinence   MUSCULOSKELETAL: No unrelieved bone/joint pain NEUROLOGIC: No headache, dizziness or focal weakness PSYCHIATRIC: No c/o anxiety or sadness   Filed Vitals:   03/14/15 1506  BP: 123/64  Pulse: 60  Temp: 97.3 F (36.3 C)  Resp: 18    SpO2 Readings from Last 1 Encounters:  03/12/15 98%        Physical Exam  GENERAL APPEARANCE: Alert, non conversant,  No acute distress.  SKIN: No diaphoresis rash HEAD: Normocephalic, atraumatic  EYES: Conjunctiva/lids clear. Pupils round, reactive. EOMs intact.  EARS: External exam WNL, canals clear. Hearing grossly normal.  NOSE: No deformity or discharge.  MOUTH/THROAT: Lips w/o lesions  RESPIRATORY: Breathing is even, unlabored. Lung sounds are clear   CARDIOVASCULAR: Heart RRR no murmurs, rubs or gallops. No peripheral edema.   GASTROINTESTINAL: Abdomen is soft, non-tender, not distended w/ normal bowel sounds. GENITOURINARY: Bladder non tender, not distended  MUSCULOSKELETAL: No abnormal joints or musculature NEUROLOGIC:  Cranial nerves 2-12 grossly intact. Moves all extremities  PSYCHIATRIC: dementia, no behavioral issues  Patient Active Problem List   Diagnosis Date Noted  . AKI (acute kidney injury) ([place]) 03/14/2015  . GERD (gastroesophageal reflux disease) 03/14/2015  . Diaper dermatitis 03/14/2015  . Syncope 03/08/2015  . Physical deconditioning 03/08/2015  . Alzheimer's dementia without behavioral disturbance 10/11/2014  . Iron deficiency anemia 10/26/2013  . Diarrhea 10/26/2013  . Near syncope 09/06/2013  . Hypothyroid 09/06/2013  . Hyponatremia 02/13/2013  . Weakness 02/13/2013  . 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 Hypersomnia with sleep apnea, unspecified 11/17/2012  . DUB (dysfunctional uterine bleeding)   . Hypertension   . Dementia   . Elevated cholesterol   . Kidney stone     CBC    Component Value Date/Time   WBC 7.1 03/11/2015 0406   RBC 3.25*  03/11/2015 0406   RBC 3.38* 03/08/2015 1828   HGB 7.9* 03/11/2015 0406   HCT 24.5* 03/11/2015 0406   PLT 363 03/11/2015 0406   MCV 75.4* 03/11/2015 0406   LYMPHSABS 1.5 11/21/2013 0723   MONOABS 0.7 11/21/2013 0723   EOSABS 0.1 11/21/2013 0723   BASOSABS 0.1 11/21/2013 0723    CMP     Component Value Date/Time   NA 130* 03/11/2015 0406   NA 131* 04/28/2013 1021   K 4.3 03/11/2015 0406   CL 102 03/11/2015 0406   CO2 19* 03/11/2015 0406   GLUCOSE 105* 03/11/2015 0406   GLUCOSE 96 04/28/2013 1021   BUN 22* 03/11/2015 0406   BUN 11 04/28/2013 1021   CREATININE 1.18* 03/11/2015 0406   CALCIUM 8.8* 03/11/2015 0406  PROT 6.2* 03/09/2015 0547   PROT 6.8 04/28/2013 1021   ALBUMIN 2.2* 03/09/2015 0547   ALBUMIN 4.4 04/28/2013 1021   AST 31 03/09/2015 0547   ALT 18 03/09/2015 0547   ALKPHOS 104 03/09/2015 0547   BILITOT 1.2 03/09/2015 0547   GFRNONAA 42* 03/11/2015 0406   GFRAA 49* 03/11/2015 0406    No results found for: HGBA1C   X-ray Chest Pa And Lateral  03/08/2015  CLINICAL DATA:  Syncope.  Altered mental status for 1 day. EXAM: CHEST  2 VIEW COMPARISON:  02/04/2014 FINDINGS: The patient is rotated to the bright on today's radiograph, reducing diagnostic sensitivity and specificity. Slightly prominent right upper mediastinal tissues are attributable to rightward rotation and vascular structures. The patient was unable to lift her arms well for the lateral projection. Also the lateral projection is somewhat oblique. No discrete pleural effusion. The lungs appear clear. Mildly tortuous thoracic aorta. Bony irregularity of the right lateral seventh and eighth ribs suggesting age-indeterminate (but probably old) rib fractures. IMPRESSION: 1. Age-indeterminate (but probably old) rib fractures of the right lateral seventh and eighth ribs. Correlate with any point tenderness or recent injury to this vicinity. No pleural effusion or pneumothorax. Otherwise negative exam. Electronically  Signed   By: Van Clines M.D.   On: 03/08/2015 17:20   Ct Head Wo Contrast  03/08/2015  CLINICAL DATA:  Witnessed fall on 03/07/2015, refused transport by EMS, no loss of consciousness, fell again today, agitation, history hypertension, dementia, hyperlipidemia EXAM: CT HEAD WITHOUT CONTRAST CT CERVICAL SPINE WITHOUT CONTRAST TECHNIQUE: Multidetector CT imaging of the head and cervical spine was performed following the standard protocol without intravenous contrast. Multiplanar CT image reconstructions of the cervical spine were also generated. COMPARISON:  CT head 02/04/2014, CT cervical spine 11/19/2013 FINDINGS: CT HEAD FINDINGS Exam degraded by motion artifacts. Generalized atrophy. Normal ventricular morphology. No midline shift or mass effect. Small vessel chronic ischemic changes of deep cerebral white matter. No gross intracranial hemorrhage, mass lesion, or acute infarction. No extra-axial fluid collections. Visualized paranasal sinuses and mastoid air cells clear. Bones unremarkable. CT CERVICAL SPINE FINDINGS Prevertebral soft tissues normal thickness. Beam hardening artifacts from the shoulders. Multilevel disc space narrowing and endplate spur formation. Vertebral body heights maintained without fracture or subluxation. Multilevel facet degenerative changes bilaterally. Visualized skullbase intact. Lung apices clear. IMPRESSION: Atrophy with small vessel chronic ischemic changes of deep cerebral white matter. Note definite acute intracranial abnormalities identified on exam limited by patient motion. Multilevel degenerative disc and facet disease changes cervical spine. No acute cervical spine abnormalities. Electronically Signed   By: Lavonia Dana M.D.   On: 03/08/2015 14:38   Ct Cervical Spine Wo Contrast  03/08/2015  CLINICAL DATA:  Witnessed fall on 03/07/2015, refused transport by EMS, no loss of consciousness, fell again today, agitation, history hypertension, dementia, hyperlipidemia  EXAM: CT HEAD WITHOUT CONTRAST CT CERVICAL SPINE WITHOUT CONTRAST TECHNIQUE: Multidetector CT imaging of the head and cervical spine was performed following the standard protocol without intravenous contrast. Multiplanar CT image reconstructions of the cervical spine were also generated. COMPARISON:  CT head 02/04/2014, CT cervical spine 11/19/2013 FINDINGS: CT HEAD FINDINGS Exam degraded by motion artifacts. Generalized atrophy. Normal ventricular morphology. No midline shift or mass effect. Small vessel chronic ischemic changes of deep cerebral white matter. No gross intracranial hemorrhage, mass lesion, or acute infarction. No extra-axial fluid collections. Visualized paranasal sinuses and mastoid air cells clear. Bones unremarkable. CT CERVICAL SPINE FINDINGS Prevertebral soft tissues normal thickness. Beam hardening  artifacts from the shoulders. Multilevel disc space narrowing and endplate spur formation. Vertebral body heights maintained without fracture or subluxation. Multilevel facet degenerative changes bilaterally. Visualized skullbase intact. Lung apices clear. IMPRESSION: Atrophy with small vessel chronic ischemic changes of deep cerebral white matter. Note definite acute intracranial abnormalities identified on exam limited by patient motion. Multilevel degenerative disc and facet disease changes cervical spine. No acute cervical spine abnormalities. Electronically Signed   By: Lavonia Dana M.D.   On: 03/08/2015 14:38    Not all labs, radiology exams or other studies done during hospitalization come through on my EPIC note; however they are reviewed by me.    Assessment and Plan  Syncope Likely secondary to multifactorial causes including dehydration, orthostasis vs questionable arrhythmia -Continue telemetry monitoring -Patient did fall and her head approximately 2 days prior to coming into the hospital -CT head and C-spine negative -Chest x-ray showed old rib fractures otherwise  unremarkable -Echocardiogram: EF [id number], grade 1 diastolic dysfunction  SNF - admitted for OT/PT  AKI (acute kidney injury) ([place]) Creatinine baseline 1, upon admission 2.3 -Creatinine currently 1.18 SNF -Repeat BMP in one week                Diarrhea SNF - had diarrhea before hospitalization and still bouts in SNF; had been on abx, c diff was negative  Hypertension SNF - cont sular and diovan;controlled  Hyponatremia Sodium upon admission 126, currently 130 SNF-Repeat BMP in one week  Hypothyroid SNF - TSH 1.771 ; cont synthroid 125 mcg daily  Iron deficiency anemia -Baseline hemoglobin approximately 10, currently 7.9 (suspect drop secondary to dilutional component) -Continue iron supplementation -FOBT negative -Anemia panel: Iron 11, ferritin 5, vitamin B-12 323 -given dose of Feraheme and vitamin B 12 SNF -Repeat CBC in one week  GERD (gastroesophageal reflux disease) SNF - not stated as uncontrolled; cont nexium 40 mg daily  Diaper dermatitis SNF - noted on admit;will start nystatin cream  Alzheimer's dementia without behavioral disturbance SNF - ongoing per daughter Henrietta Dine, not confused during hospitalization, progressive; cont razadyne   Time spent > 45 min;> 50% of time with patient was spent reviewing records, labs, tests and studies, counseling and developing plan of care  Hennie Duos, MD

## 2015-03-14 NOTE — Assessment & Plan Note (Signed)
Sodium upon admission 126, currently 130 SNF-Repeat BMP in one week

## 2015-03-14 NOTE — Assessment & Plan Note (Signed)
Likely secondary to multifactorial causes including dehydration, orthostasis vs questionable arrhythmia -Continue telemetry monitoring -Patient did fall and her head approximately 2 days prior to coming into the hospital -CT head and C-spine negative -Chest x-ray showed old rib fractures otherwise unremarkable -Echocardiogram: EF [id number], grade 1 diastolic dysfunction  SNF - admitted for OT/PT

## 2015-03-14 NOTE — Assessment & Plan Note (Signed)
SNF - cont sular and diovan;controlled

## 2015-03-14 NOTE — Assessment & Plan Note (Addendum)
SNF - ongoing per daughter Henrietta Dine, not confused during hospitalization, progressive; cont razadyne

## 2015-03-14 NOTE — Assessment & Plan Note (Signed)
SNF - not stated as uncontrolled; cont nexium 40 mg daily

## 2015-03-14 NOTE — Assessment & Plan Note (Signed)
SNF - had diarrhea before hospitalization and still bouts in SNF; had been on abx, c diff was negative

## 2015-03-16 ENCOUNTER — Non-Acute Institutional Stay (SKILLED_NURSING_FACILITY): Payer: Medicare Other | Admitting: Internal Medicine

## 2015-03-16 ENCOUNTER — Ambulatory Visit: Payer: Medicare Other | Admitting: Physical Therapy

## 2015-03-16 DIAGNOSIS — D72829 Elevated white blood cell count, unspecified: Secondary | ICD-10-CM | POA: Diagnosis not present

## 2015-03-16 DIAGNOSIS — D509 Iron deficiency anemia, unspecified: Secondary | ICD-10-CM | POA: Diagnosis not present

## 2015-03-16 DIAGNOSIS — E871 Hypo-osmolality and hyponatremia: Secondary | ICD-10-CM

## 2015-03-16 NOTE — Progress Notes (Signed)
Patient ID: NIJAY MCAMIS, female   DOB: [birth year], 82 y.o.   MRN: [id number] MRN: [id number] Name: Brianna [name]  Sex: female Age: 82 y.o. DOB: [birth year]  [facility] Facility/Room:110 Level Of Care: SNF Provider: Roena Malady Emergency Contacts: Extended Emergency Contact Information Primary Emergency Contact: Leduc,Charles Address: [address]          [place], [place] [address] [place] Home Phone: [phone] Relation: Spouse Secondary Emergency Contact: Theresia Lo [place] of [place] Mobile Phone: [phone] Relation: Daughter  Code Status:   Allergies: Penicillins  Chief Complaint  Patient presents with  . Acute Visit   secondary to leukocytosis  HPI: Patient is 82 y.o. female who was admittted to [facility] from 2/8-11 after presenting wityh syncope lasting 2-3 minutes. Pt had been having diarhhea prior. Syncope work-up was negative but hospital course was complicated by AKI, anemia and hyponatremia. Pt was admitted to SNF for generalized weakness. While at [facility] pt  followed for dementia, tx with galantamine, HTN, tx with sular and avapro and hypothyroidism, tx with synthroid.  Routine lab done on February 14 shows her white count is up somewhat at 12.9 hemoglobin and she shows improvement at 8.5-neutrophils absolute are elevated at 9.1 Appears her white count was within normal range during her hospitalization.  allso appears to have chronic hyponatremia with sodiums largely around 1:30 in the hospital it was 127 on lab done February 14 this will need follow-up as well  Per nursing she is doing better today when she first came apparently was not eating and drinking well well but she appears to be adapting to the facility. Doing better in this regard  She does not complain of cough fever or dysuria-nursing staff has not really noted any of this either.    Past Medical History  Diagnosis Date  . DUB (dysfunctional uterine bleeding)   .  Thyroid disease     Hypothyroid  . Hypertension   . Dementia   . Elevated cholesterol   . Kidney stone   . Hyperlipidemia   . Depression with anxiety   . Sleep apnea   . Adenomatous colon polyp     Past Surgical History  Procedure Laterality Date  . Cholecystectomy    . Kidney stones    . Colon tumor-benign    . Abdominal hysterectomy    . Appendectomy        Medication List       This list is accurate as of: 03/16/15 11:59 PM.  Always use your most recent med list.               esomeprazole 40 MG capsule  Commonly known as:  NEXIUM  Take 40 mg by mouth daily at 12 noon.     feeding supplement (ENSURE ENLIVE) Liqd  Take 237 mLs by mouth 3 (three) times daily as needed (offer (strawberry) ensure to patient if meal completion is less than 50%).     galantamine 16 MG 24 hr capsule  Commonly known as:  RAZADYNE ER  TAKE ONE CAPSULE BY MOUTH EVERY DAY WITH BREAKFAST     levothyroxine 125 MCG tablet  Commonly known as:  SYNTHROID, LEVOTHROID  Take 125 mcg by mouth daily before breakfast.     multivitamin with minerals Tabs tablet  Take 1 tablet by mouth daily.     nisoldipine 17 MG 24 hr tablet  Commonly known as:  SULAR  Take 17 mg by mouth daily.  NU-IRON 150 MG capsule  Generic drug:  iron polysaccharides  Take 150-300 mg by mouth 3 (three) times daily. Take 1 capsule in the morning Take 2 capsules at lunch & dinner     PRISTIQ 50 MG 24 hr tablet  Generic drug:  desvenlafaxine  Take 50 mg by mouth daily.     traZODone 50 MG tablet  Commonly known as:  DESYREL  Take 25 mg by mouth at bedtime.     valsartan 320 MG tablet  Commonly known as:  DIOVAN  Take 320 mg by mouth daily.     Vitamin D (Ergocalciferol) 50000 units Caps capsule  Commonly known as:  DRISDOL  Take 50,000 Units by mouth every 7 (seven) days. Tuesday        No orders of the defined types were placed in this encounter.    Immunization History  Administered Date(s)  Administered  . Influenza-Unspecified 11/28/2012    Social History  Substance Use Topics  . Smoking status: Never Smoker   . Smokeless tobacco: Never Used  . Alcohol Use: No    Family history is  + DM, HD  Review of Systems  DATA OBTAINED: from nurse, and patient GENERAL:  no fevers, fatigue,  eating better SKIN: No itching, rash or wounds EYES: No eye pain, redness, discharge EARS: No earache, tinnitus, change in hearing NOSE: No congestion, drainage or bleeding  MOUTH/THROAT: No mouth or tooth pain, No sore throat RESPIRATORY: No cough, wheezing, SOB CARDIAC: No chest pain, palpitations, lower extremity edema  GI: No abdominal pain, No N/V, apparently diarrhea has improved , noconstipation, No heartburn or reflux  GU: No dysuria, frequency or urgency, , improvongincontinence  MUSCULOSKELETAL: No unrelieved bone/joint pain NEUROLOGIC: No headache, dizziness or focal weakness PSYCHIATRIC: No c/o anxiety or sadness   Filed Vitals:   03/19/15 1401  BP: 125/61  Pulse: 60  Temp: 97.2 F (36.2 C)  Resp: 18    SpO2 Readings from Last 1 Encounters:  03/12/15 98%        Physical Exam  GENERAL APPEARANCE: Alert, minimally  conversant,  No acute distress.  SKIN: No diaphoresis rash HEAD: Normocephalic, atraumatic  EYES: Conjunctiva/lids clear. Pupils round, reactive. EOMs intact.  EARS: External exam WNL, canals clear. Hearing grossly normal.  NOSE: No deformity or discharge.  MOUTH/THROAT: Lips w/o lesions  RESPIRATORY: Breathing is even, unlabored. Lung sounds are clear   CARDIOVASCULAR: Heart RRR no murmurs, rubs or gallops. No peripheral edema.   GASTROINTESTINAL: Abdomen is soft, non-tender, not distended w/ normal bowel sounds. GENITOURINARY: Bladder non tender, not distended  No overt CV tenderness MUSCULOSKELETAL: No abnormal joints or musculature NEUROLOGIC:  Cranial nerves 2-12 grossly intact. Moves all extremities  PSYCHIATRIC: dementia, no behavioral  issues--does speak but has a flat affect  Patient Active Problem List   Diagnosis Date Noted  . Leukocytosis 03/19/2015  . AKI (acute kidney injury) (HCC) 03/14/2015  . GERD (gastroesophageal reflux disease) 03/14/2015  . Diaper dermatitis 03/14/2015  . Syncope 03/08/2015  . Physical deconditioning 03/08/2015  . Alzheimer's dementia without behavioral disturbance 10/11/2014  . Iron deficiency anemia 10/26/2013  . Diarrhea 10/26/2013  . Near syncope 09/06/2013  . Hypothyroid 09/06/2013  . Hyponatremia 02/13/2013  . Weakness 02/13/2013  . 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 Hypersomnia with sleep apnea, unspecified 11/17/2012  . DUB (dysfunctional uterine bleeding)   . Hypertension   . Dementia   . Elevated cholesterol   . Kidney stone  Labs.  03/14/2015.  Sodium 127 potassium 4.5 BUN 27 creatinine 1.1.  WBC 12.9 hemoglobin 8.5 platelets 433.  Absolute neutrophils 9.1 CBC    Component Value Date/Time   WBC 7.1 03/11/2015 0406   RBC 3.25* 03/11/2015 0406   RBC 3.38* 03/08/2015 1828   HGB 7.9* 03/11/2015 0406   HCT 24.5* 03/11/2015 0406   PLT 363 03/11/2015 0406   MCV 75.4* 03/11/2015 0406   LYMPHSABS 1.5 11/21/2013 0723   MONOABS 0.7 11/21/2013 0723   EOSABS 0.1 11/21/2013 0723   BASOSABS 0.1 11/21/2013 0723    CMP     Component Value Date/Time   NA 130* 03/11/2015 0406   NA 131* 04/28/2013 1021   K 4.3 03/11/2015 0406   CL 102 03/11/2015 0406   CO2 19* 03/11/2015 0406   GLUCOSE 105* 03/11/2015 0406   GLUCOSE 96 04/28/2013 1021   BUN 22* 03/11/2015 0406   BUN 11 04/28/2013 1021   CREATININE 1.18* 03/11/2015 0406   CALCIUM 8.8* 03/11/2015 0406   PROT 6.2* 03/09/2015 0547   PROT 6.8 04/28/2013 1021   ALBUMIN 2.2* 03/09/2015 0547   ALBUMIN 4.4 04/28/2013 1021   AST 31 03/09/2015 0547   ALT 18 03/09/2015 0547   ALKPHOS 104 03/09/2015 0547   BILITOT 1.2 03/09/2015 0547   GFRNONAA 42* 03/11/2015 0406    GFRAA 49* 03/11/2015 0406    No results found for: HGBA1C   X-ray Chest Pa And Lateral  03/08/2015  CLINICAL DATA:  Syncope.  Altered mental status for 1 day. EXAM: CHEST  2 VIEW COMPARISON:  02/04/2014 FINDINGS: The patient is rotated to the bright on today's radiograph, reducing diagnostic sensitivity and specificity. Slightly prominent right upper mediastinal tissues are attributable to rightward rotation and vascular structures. The patient was unable to lift her arms well for the lateral projection. Also the lateral projection is somewhat oblique. No discrete pleural effusion. The lungs appear clear. Mildly tortuous thoracic aorta. Bony irregularity of the right lateral seventh and eighth ribs suggesting age-indeterminate (but probably old) rib fractures. IMPRESSION: 1. Age-indeterminate (but probably old) rib fractures of the right lateral seventh and eighth ribs. Correlate with any point tenderness or recent injury to this vicinity. No pleural effusion or pneumothorax. Otherwise negative exam. Electronically Signed   By: Gaylyn Rong M.D.   On: 03/08/2015 17:20   Ct Head Wo Contrast  03/08/2015  CLINICAL DATA:  Witnessed fall on 03/07/2015, refused transport by EMS, no loss of consciousness, fell again today, agitation, history hypertension, dementia, hyperlipidemia EXAM: CT HEAD WITHOUT CONTRAST CT CERVICAL SPINE WITHOUT CONTRAST TECHNIQUE: Multidetector CT imaging of the head and cervical spine was performed following the standard protocol without intravenous contrast. Multiplanar CT image reconstructions of the cervical spine were also generated. COMPARISON:  CT head 02/04/2014, CT cervical spine 11/19/2013 FINDINGS: CT HEAD FINDINGS Exam degraded by motion artifacts. Generalized atrophy. Normal ventricular morphology. No midline shift or mass effect. Small vessel chronic ischemic changes of deep cerebral white matter. No gross intracranial hemorrhage, mass lesion, or acute infarction. No  extra-axial fluid collections. Visualized paranasal sinuses and mastoid air cells clear. Bones unremarkable. CT CERVICAL SPINE FINDINGS Prevertebral soft tissues normal thickness. Beam hardening artifacts from the shoulders. Multilevel disc space narrowing and endplate spur formation. Vertebral body heights maintained without fracture or subluxation. Multilevel facet degenerative changes bilaterally. Visualized skullbase intact. Lung apices clear. IMPRESSION: Atrophy with small vessel chronic ischemic changes of deep cerebral white matter. Note definite acute intracranial abnormalities identified on exam limited by patient  motion. Multilevel degenerative disc and facet disease changes cervical spine. No acute cervical spine abnormalities. Electronically Signed   By: Ulyses Southward M.D.   On: 03/08/2015 14:38   Ct Cervical Spine Wo Contrast  03/08/2015  CLINICAL DATA:  Witnessed fall on 03/07/2015, refused transport by EMS, no loss of consciousness, fell again today, agitation, history hypertension, dementia, hyperlipidemia EXAM: CT HEAD WITHOUT CONTRAST CT CERVICAL SPINE WITHOUT CONTRAST TECHNIQUE: Multidetector CT imaging of the head and cervical spine was performed following the standard protocol without intravenous contrast. Multiplanar CT image reconstructions of the cervical spine were also generated. COMPARISON:  CT head 02/04/2014, CT cervical spine 11/19/2013 FINDINGS: CT HEAD FINDINGS Exam degraded by motion artifacts. Generalized atrophy. Normal ventricular morphology. No midline shift or mass effect. Small vessel chronic ischemic changes of deep cerebral white matter. No gross intracranial hemorrhage, mass lesion, or acute infarction. No extra-axial fluid collections. Visualized paranasal sinuses and mastoid air cells clear. Bones unremarkable. CT CERVICAL SPINE FINDINGS Prevertebral soft tissues normal thickness. Beam hardening artifacts from the shoulders. Multilevel disc space narrowing and endplate  spur formation. Vertebral body heights maintained without fracture or subluxation. Multilevel facet degenerative changes bilaterally. Visualized skullbase intact. Lung apices clear. IMPRESSION: Atrophy with small vessel chronic ischemic changes of deep cerebral white matter. Note definite acute intracranial abnormalities identified on exam limited by patient motion. Multilevel degenerative disc and facet disease changes cervical spine. No acute cervical spine abnormalities. Electronically Signed   By: Ulyses Southward M.D.   On: 03/08/2015 14:38    Not all labs, radiology exams or other studies done during hospitalization come through on my EPIC note; however they are reviewed by me.    Assessment and Plan  1 leukocytosis unclear etiology-she does not overtly complain of dysuria fever cough or congestion physical exam was fairly benign-apparently diarrhea has resolved this will have to be monitored closely for any reoccurrence-apparently C. difficile culture was negative in the hospital.  We will check a chest x-ray just to rule out any pulmonary etiology-as well as a UA CNS will obtain a chest x-ray to rule out any pulmonary etiology as well as a UA CNS in regards to possible UTI.  #2-history of hyponatremia which appears relatively chronic since hospitalization this will warrant repeating first laboratory day next week it appears in the hospital she ranged from 126-1:30.  Iron deficiency anemia hemoglobin is rising to 8.5 on hospital discharge apparently was 7.9-she is on iron supplementation fecal occult blood testing was negative in the hospital.  #4 history of all times dementia without behavioral disturbance-she is on Razadyne--she appears to be adapting a bit better to facility apparently eating and drinking in talking a bit more at this point will monitor.  [id number]          Orpheus Hayhurst C,

## 2015-03-19 ENCOUNTER — Encounter: Payer: Self-pay | Admitting: Internal Medicine

## 2015-03-19 DIAGNOSIS — D72829 Elevated white blood cell count, unspecified: Secondary | ICD-10-CM | POA: Insufficient documentation

## 2015-03-23 ENCOUNTER — Non-Acute Institutional Stay (SKILLED_NURSING_FACILITY): Payer: Medicare Other | Admitting: Internal Medicine

## 2015-03-23 DIAGNOSIS — I10 Essential (primary) hypertension: Secondary | ICD-10-CM

## 2015-03-23 DIAGNOSIS — E871 Hypo-osmolality and hyponatremia: Secondary | ICD-10-CM | POA: Diagnosis not present

## 2015-03-23 DIAGNOSIS — R55 Syncope and collapse: Secondary | ICD-10-CM

## 2015-03-23 DIAGNOSIS — D509 Iron deficiency anemia, unspecified: Secondary | ICD-10-CM

## 2015-03-23 DIAGNOSIS — F0391 Unspecified dementia with behavioral disturbance: Secondary | ICD-10-CM | POA: Diagnosis not present

## 2015-03-23 DIAGNOSIS — F03918 Unspecified dementia, unspecified severity, with other behavioral disturbance: Secondary | ICD-10-CM

## 2015-03-23 NOTE — Progress Notes (Signed)
Patient ID: SHALETTE DEEKEN, female   DOB: 1932-10-14, 82 y.o.   MRN: [id number]  MRN: [id number] Name: LOUNETTE RAIMONDO  Sex: female Age: 82 y.o. DOB: May 26, 1932  [facility] #: [facility] Facility/Room:110 Level Of Care: SNF Provider: Roena Malady Emergency Contacts: Extended Emergency Contact Information Primary Emergency Contact: Rodriges,Charles Address: [address]          [place], [place] [address] [place] Home Phone: [phone] Relation: Spouse Secondary Emergency Contact: Theresia Lo [place] Mobile Phone: [phone] Relation: Daughter  Code Status:   Allergies: Penicillins  Chief Complaint  Patient presents with  . Discharge Note     HPI: Patient is 82 y.o. female who was admittted to [facility] from 2/8-11 after presenting wityh syncope lasting 2-3 minutes. Pt had been having diarhhea prior. Syncope work-up was negative but hospital course was complicated by AKI, anemia and hyponatremia. Pt was admitted to SNF for generalized weakness. While at [facility] pt  followed for dementia, tx with galantamine, HTN, tx with sular and avapro and hypothyroidism, tx with synthroid.  Routine lab done on February 14 shows her white count is up somewhat at 12.9 hemoglobin and she shows improvement at 8.5-neutrophils absolute are elevated at 9.1 Appears her white count was within normal range during her hospitalization.  allso appears to have chronic hyponatremia with sodiums largely around 1:30 in the hospital it was 127 on lab done February 14 this will need follow-up as well     She continues to deny fever or dysuria-nursing staff has not really noted any of this either.  She has gained strength during her stay here she has a very supportive daughter who is actually in the room today.  Regards to hypertension this appears stable with recent blood pressures 133/64-139/67  Kidney function appears to be stable with creatinine 1.1 BUN of 27 on lab done February  14.  She does have a history of dementia but appears to be doing relatively well with supportive care-she will need continued PT and OT nursing support for strengthening and follow-up of her medical issues-also will need a rolling walker for ambulation.      Past Medical History  Diagnosis Date  . DUB (dysfunctional uterine bleeding)   . Thyroid disease     Hypothyroid  . Hypertension   . Dementia   . Elevated cholesterol   . Kidney stone   . Hyperlipidemia   . Depression with anxiety   . Sleep apnea   . Adenomatous colon polyp     Past Surgical History  Procedure Laterality Date  . Cholecystectomy    . Kidney stones    . Colon tumor-benign    . Abdominal hysterectomy    . Appendectomy        Medication List       This list is accurate as of: 03/23/15 11:59 PM.  Always use your most recent med list.               esomeprazole 40 MG capsule  Commonly known as:  NEXIUM  Take 40 mg by mouth daily at 12 noon.     feeding supplement (ENSURE ENLIVE) Liqd  Take 237 mLs by mouth 3 (three) times daily as needed (offer (strawberry) ensure to patient if meal completion is less than 50%).     galantamine 16 MG 24 hr capsule  Commonly known as:  RAZADYNE ER  TAKE ONE CAPSULE BY MOUTH EVERY DAY WITH BREAKFAST  levothyroxine 125 MCG tablet  Commonly known as:  SYNTHROID, LEVOTHROID  Take 125 mcg by mouth daily before breakfast.     multivitamin with minerals Tabs tablet  Take 1 tablet by mouth daily.     nisoldipine 17 MG 24 hr tablet  Commonly known as:  SULAR  Take 17 mg by mouth daily.     NU-IRON 150 MG capsule  Generic drug:  iron polysaccharides  Take 150-300 mg by mouth 3 (three) times daily. Take 1 capsule in the morning Take 2 capsules at lunch & dinner     PRISTIQ 50 MG 24 hr tablet  Generic drug:  desvenlafaxine  Take 50 mg by mouth daily.     traZODone 50 MG tablet  Commonly known as:  DESYREL  Take 25 mg by mouth at bedtime.     valsartan  320 MG tablet  Commonly known as:  DIOVAN  Take 320 mg by mouth daily.     Vitamin D (Ergocalciferol) 50000 units Caps capsule  Commonly known as:  DRISDOL  Take 50,000 Units by mouth every 7 (seven) days. Tuesday        No orders of the defined types were placed in this encounter.    Immunization History  Administered Date(s) Administered  . Influenza-Unspecified 11/28/2012    Social History  Substance Use Topics  . Smoking status: Never Smoker   . Smokeless tobacco: Never Used  . Alcohol Use: No    Family history is  + DM, HD  Review of Systems  DATA OBTAINED: from nurse, and patient GENERAL:  no fevers, fatigue,  eating better SKIN: No itching, or wounds--hpr daughter at times develops a buttocks rash EYES: No eye pain, redness, discharge EARS: No earache, tinnitus, change in hearing NOSE: No congestion, drainage or bleeding  MOUTH/THROAT: No mouth or tooth pain, No sore throat RESPIRATORY: No cough, wheezing, SOB CARDIAC: No chest pain, palpitations, lower extremity edema  GI: No abdominal pain, No N/V, apparently diarrhea has improved , noconstipation, No heartburn or reflux  GU: No dysuria, frequency or urgency, , improvongincontinence  MUSCULOSKELETAL: No unrelieved bone/joint pain NEUROLOGIC: No headache, dizziness or focal weakness PSYCHIATRIC: No c/o anxiety or sadness   There were no vitals filed for this visit.  SpO2 Readings from Last 1 Encounters:  03/12/15 98%        Physical Exam  GENERAL APPEARANCE: Alert, now somewhat more conversant,  No acute distress.  SKIN: No diaphoresis only has a small buttocks rash this was difficult to assess secondary to patient positioning wound care will need to follow with instructions on discharge HEAD: Normocephalic, atraumatic  EYES: Conjunctiva/lids clear. Pupils round, reactive. EOMs intact.  EARS: External exam WNL, canals clear. Hearing grossly normal.  NOSE: No deformity or discharge.  MOUTH/THROAT:  Oropharynx clear mucous membranes moist RESPIRATORY: Breathing is even, unlabored. Lung sounds are clear   CARDIOVASCULAR: Heart RRR no murmurs, rubs or gallops. No peripheral edema.   GASTROINTESTINAL: Abdomen is soft, non-tender, not distended w/ normal bowel sounds. GENITOURINARY: Bladder non tender, not distended  No overt CV tenderness MUSCULOSKELETAL: No abnormal joints or musculature she is ambulatory family does assist her would benefit from a rolling walker NEUROLOGIC:  Cranial nerves 2-12 grossly intact. Moves all extremities  PSYCHIATRIC: dementia, no behavioral issues--does speak but has a flat affect--oriented to self  Patient Active Problem List   Diagnosis Date Noted  . Leukocytosis 03/19/2015  . AKI (acute kidney injury) (HCC) 03/14/2015  . GERD (gastroesophageal reflux disease) 03/14/2015  .  Diaper dermatitis 03/14/2015  . Syncope 03/08/2015  . Physical deconditioning 03/08/2015  . Alzheimer's dementia without behavioral disturbance 10/11/2014  . Iron deficiency anemia 10/26/2013  . Diarrhea 10/26/2013  . Near syncope 09/06/2013  . Hypothyroid 09/06/2013  . Hyponatremia 02/13/2013  . Weakness 02/13/2013  . 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 Hypersomnia with sleep apnea, unspecified 11/17/2012  . DUB (dysfunctional uterine bleeding)   . Hypertension   . Dementia   . Elevated cholesterol   . Kidney stone    Labs.     03/14/2015.  Sodium 127 potassium 4.5 BUN 27 creatinine 1.1.  WBC 12.9 hemoglobin 8.5 platelets 433.  Absolute neutrophils 9.1 CBC    Component Value Date/Time   WBC 7.1 03/11/2015 0406   RBC 3.25* 03/11/2015 0406   RBC 3.38* 03/08/2015 1828   HGB 7.9* 03/11/2015 0406   HCT 24.5* 03/11/2015 0406   PLT 363 03/11/2015 0406   MCV 75.4* 03/11/2015 0406   LYMPHSABS 1.5 11/21/2013 0723   MONOABS 0.7 11/21/2013 0723   EOSABS 0.1 11/21/2013 0723   BASOSABS 0.1 11/21/2013 0723    CMP      Component Value Date/Time   NA 130* 03/11/2015 0406   NA 131* 04/28/2013 1021   K 4.3 03/11/2015 0406   CL 102 03/11/2015 0406   CO2 19* 03/11/2015 0406   GLUCOSE 105* 03/11/2015 0406   GLUCOSE 96 04/28/2013 1021   BUN 22* 03/11/2015 0406   BUN 11 04/28/2013 1021   CREATININE 1.18* 03/11/2015 0406   CALCIUM 8.8* 03/11/2015 0406   PROT 6.2* 03/09/2015 0547   PROT 6.8 04/28/2013 1021   ALBUMIN 2.2* 03/09/2015 0547   ALBUMIN 4.4 04/28/2013 1021   AST 31 03/09/2015 0547   ALT 18 03/09/2015 0547   ALKPHOS 104 03/09/2015 0547   BILITOT 1.2 03/09/2015 0547   GFRNONAA 42* 03/11/2015 0406   GFRAA 49* 03/11/2015 0406    No results found for: HGBA1C   X-ray Chest Pa And Lateral  03/08/2015  CLINICAL DATA:  Syncope.  Altered mental status for 1 day. EXAM: CHEST  2 VIEW COMPARISON:  02/04/2014 FINDINGS: The patient is rotated to the bright on today's radiograph, reducing diagnostic sensitivity and specificity. Slightly prominent right upper mediastinal tissues are attributable to rightward rotation and vascular structures. The patient was unable to lift her arms well for the lateral projection. Also the lateral projection is somewhat oblique. No discrete pleural effusion. The lungs appear clear. Mildly tortuous thoracic aorta. Bony irregularity of the right lateral seventh and eighth ribs suggesting age-indeterminate (but probably old) rib fractures. IMPRESSION: 1. Age-indeterminate (but probably old) rib fractures of the right lateral seventh and eighth ribs. Correlate with any point tenderness or recent injury to this vicinity. No pleural effusion or pneumothorax. Otherwise negative exam. Electronically Signed   By: Gaylyn Rong M.D.   On: 03/08/2015 17:20   Ct Head Wo Contrast  03/08/2015  CLINICAL DATA:  Witnessed fall on 03/07/2015, refused transport by EMS, no loss of consciousness, fell again today, agitation, history hypertension, dementia, hyperlipidemia EXAM: CT HEAD WITHOUT CONTRAST  CT CERVICAL SPINE WITHOUT CONTRAST TECHNIQUE: Multidetector CT imaging of the head and cervical spine was performed following the standard protocol without intravenous contrast. Multiplanar CT image reconstructions of the cervical spine were also generated. COMPARISON:  CT head 02/04/2014, CT cervical spine 11/19/2013 FINDINGS: CT HEAD FINDINGS Exam degraded by motion artifacts. Generalized atrophy. Normal ventricular morphology. No midline shift or mass effect. Small vessel  chronic ischemic changes of deep cerebral white matter. No gross intracranial hemorrhage, mass lesion, or acute infarction. No extra-axial fluid collections. Visualized paranasal sinuses and mastoid air cells clear. Bones unremarkable. CT CERVICAL SPINE FINDINGS Prevertebral soft tissues normal thickness. Beam hardening artifacts from the shoulders. Multilevel disc space narrowing and endplate spur formation. Vertebral body heights maintained without fracture or subluxation. Multilevel facet degenerative changes bilaterally. Visualized skullbase intact. Lung apices clear. IMPRESSION: Atrophy with small vessel chronic ischemic changes of deep cerebral white matter. Note definite acute intracranial abnormalities identified on exam limited by patient motion. Multilevel degenerative disc and facet disease changes cervical spine. No acute cervical spine abnormalities. Electronically Signed   By: Ulyses Southward M.D.   On: 03/08/2015 14:38   Ct Cervical Spine Wo Contrast  03/08/2015  CLINICAL DATA:  Witnessed fall on 03/07/2015, refused transport by EMS, no loss of consciousness, fell again today, agitation, history hypertension, dementia, hyperlipidemia EXAM: CT HEAD WITHOUT CONTRAST CT CERVICAL SPINE WITHOUT CONTRAST TECHNIQUE: Multidetector CT imaging of the head and cervical spine was performed following the standard protocol without intravenous contrast. Multiplanar CT image reconstructions of the cervical spine were also generated. COMPARISON:  CT  head 02/04/2014, CT cervical spine 11/19/2013 FINDINGS: CT HEAD FINDINGS Exam degraded by motion artifacts. Generalized atrophy. Normal ventricular morphology. No midline shift or mass effect. Small vessel chronic ischemic changes of deep cerebral white matter. No gross intracranial hemorrhage, mass lesion, or acute infarction. No extra-axial fluid collections. Visualized paranasal sinuses and mastoid air cells clear. Bones unremarkable. CT CERVICAL SPINE FINDINGS Prevertebral soft tissues normal thickness. Beam hardening artifacts from the shoulders. Multilevel disc space narrowing and endplate spur formation. Vertebral body heights maintained without fracture or subluxation. Multilevel facet degenerative changes bilaterally. Visualized skullbase intact. Lung apices clear. IMPRESSION: Atrophy with small vessel chronic ischemic changes of deep cerebral white matter. Note definite acute intracranial abnormalities identified on exam limited by patient motion. Multilevel degenerative disc and facet disease changes cervical spine. No acute cervical spine abnormalities. Electronically Signed   By: Ulyses Southward M.D.   On: 03/08/2015 14:38    Not all labs, radiology exams or other studies done during hospitalization come through on my EPIC note; however they are reviewed by me.    Assessment and Plan   syncope-have been no recurrence here--onthis was thought to be possibly multifactorial hypotension-first is arrhythmia-dehydration-this appears to have stabilized.  History of acute kidney injury this appears resolved with creatinine of 1.1 which is near her baseline Will update this tomorrow notify primary care provider of results.  Hypertension this appears stable as noted above she is on Sular and Diovan   leukocytosis unclear etiology- She no longer has diarrhea does not complain of shortness breath or cough will update this does not really show signs of infection-no complaints of dysuria  #istory of  hyponatremia which appears relatively chronic since hospitalization this will warrant repeating  it appears in the hospital she ranged from 126-1:30.  Iron deficiency anemia hemoglobin is rising to 8.5 on hospital discharge apparently was 7.9-she is on iron supplementation fecal occult blood testing was negative in the hospital will update tomorrow notify primary care provider of results.   history of  dementia without behavioral disturbance-she is on Razadyne--apparently this has been slowly progressive follow-up by primary care provider she appears to have done better here when she got adapted to the facility  History   diaper dermatitis status apparently this is somewhat recurrent per daughter will have wound care discussed this  with family about proper care when she is discharged.  And she will need continued PT and OT for strengthening as well as a rolling walker help with ambulation and nursing support for multiple medical issues    [id number]-of note greater than 30 minutes spent on this discharge summary-greater than 50% of time spent coordinating plan of care for numerous diagnoses-prescriptions have been written         Opel Lejeune C,

## 2015-03-27 ENCOUNTER — Telehealth: Payer: Self-pay | Admitting: Neurology

## 2015-03-27 NOTE — Telephone Encounter (Signed)
Brianna [name] [phone] called regarding order received for Nursing and PT, Daughter/POA-Caregiver stated that patient doesn't need nursing care, only needs PT. Arville Go will proceed with PT only.

## 2015-03-27 NOTE — Telephone Encounter (Signed)
Noted, thanks!

## 2015-03-28 DIAGNOSIS — F0280 Dementia in other diseases classified elsewhere without behavioral disturbance: Secondary | ICD-10-CM | POA: Diagnosis not present

## 2015-03-28 DIAGNOSIS — R2681 Unsteadiness on feet: Secondary | ICD-10-CM | POA: Diagnosis not present

## 2015-03-28 DIAGNOSIS — I10 Essential (primary) hypertension: Secondary | ICD-10-CM | POA: Diagnosis not present

## 2015-03-28 DIAGNOSIS — G309 Alzheimer's disease, unspecified: Secondary | ICD-10-CM | POA: Diagnosis not present

## 2015-03-28 DIAGNOSIS — F418 Other specified anxiety disorders: Secondary | ICD-10-CM | POA: Diagnosis not present

## 2015-03-28 DIAGNOSIS — R531 Weakness: Secondary | ICD-10-CM | POA: Diagnosis not present

## 2015-03-28 NOTE — Telephone Encounter (Signed)
[facility] [phone] called requesting frequency of PT, requests 3x week for 4 weeks, then 2x week for 4 weeks for home health PT.

## 2015-03-29 NOTE — Telephone Encounter (Signed)
Spoke to Dr. Brett Fairy who gave me a verbal order for this PT. I called [facility] and gave verbal order for the requested frequency.

## 2015-03-30 DIAGNOSIS — G309 Alzheimer's disease, unspecified: Secondary | ICD-10-CM | POA: Diagnosis not present

## 2015-03-30 DIAGNOSIS — I10 Essential (primary) hypertension: Secondary | ICD-10-CM | POA: Diagnosis not present

## 2015-03-30 DIAGNOSIS — F0280 Dementia in other diseases classified elsewhere without behavioral disturbance: Secondary | ICD-10-CM | POA: Diagnosis not present

## 2015-03-30 DIAGNOSIS — R531 Weakness: Secondary | ICD-10-CM | POA: Diagnosis not present

## 2015-03-30 DIAGNOSIS — R2681 Unsteadiness on feet: Secondary | ICD-10-CM | POA: Diagnosis not present

## 2015-03-30 DIAGNOSIS — F418 Other specified anxiety disorders: Secondary | ICD-10-CM | POA: Diagnosis not present

## 2015-03-31 DIAGNOSIS — M109 Gout, unspecified: Secondary | ICD-10-CM | POA: Diagnosis not present

## 2015-03-31 DIAGNOSIS — D6489 Other specified anemias: Secondary | ICD-10-CM | POA: Diagnosis not present

## 2015-04-03 DIAGNOSIS — F0280 Dementia in other diseases classified elsewhere without behavioral disturbance: Secondary | ICD-10-CM | POA: Diagnosis not present

## 2015-04-03 DIAGNOSIS — R2681 Unsteadiness on feet: Secondary | ICD-10-CM | POA: Diagnosis not present

## 2015-04-03 DIAGNOSIS — R531 Weakness: Secondary | ICD-10-CM | POA: Diagnosis not present

## 2015-04-03 DIAGNOSIS — I10 Essential (primary) hypertension: Secondary | ICD-10-CM | POA: Diagnosis not present

## 2015-04-03 DIAGNOSIS — G309 Alzheimer's disease, unspecified: Secondary | ICD-10-CM | POA: Diagnosis not present

## 2015-04-03 DIAGNOSIS — F418 Other specified anxiety disorders: Secondary | ICD-10-CM | POA: Diagnosis not present

## 2015-04-05 DIAGNOSIS — I10 Essential (primary) hypertension: Secondary | ICD-10-CM | POA: Diagnosis not present

## 2015-04-05 DIAGNOSIS — F0280 Dementia in other diseases classified elsewhere without behavioral disturbance: Secondary | ICD-10-CM | POA: Diagnosis not present

## 2015-04-05 DIAGNOSIS — R2681 Unsteadiness on feet: Secondary | ICD-10-CM | POA: Diagnosis not present

## 2015-04-05 DIAGNOSIS — G309 Alzheimer's disease, unspecified: Secondary | ICD-10-CM | POA: Diagnosis not present

## 2015-04-05 DIAGNOSIS — R531 Weakness: Secondary | ICD-10-CM | POA: Diagnosis not present

## 2015-04-05 DIAGNOSIS — F418 Other specified anxiety disorders: Secondary | ICD-10-CM | POA: Diagnosis not present

## 2015-04-06 DIAGNOSIS — R531 Weakness: Secondary | ICD-10-CM | POA: Diagnosis not present

## 2015-04-06 DIAGNOSIS — R2681 Unsteadiness on feet: Secondary | ICD-10-CM | POA: Diagnosis not present

## 2015-04-06 DIAGNOSIS — G309 Alzheimer's disease, unspecified: Secondary | ICD-10-CM | POA: Diagnosis not present

## 2015-04-06 DIAGNOSIS — F418 Other specified anxiety disorders: Secondary | ICD-10-CM | POA: Diagnosis not present

## 2015-04-06 DIAGNOSIS — F0280 Dementia in other diseases classified elsewhere without behavioral disturbance: Secondary | ICD-10-CM | POA: Diagnosis not present

## 2015-04-06 DIAGNOSIS — I10 Essential (primary) hypertension: Secondary | ICD-10-CM | POA: Diagnosis not present

## 2015-04-07 DIAGNOSIS — I10 Essential (primary) hypertension: Secondary | ICD-10-CM | POA: Diagnosis not present

## 2015-04-07 DIAGNOSIS — R531 Weakness: Secondary | ICD-10-CM | POA: Diagnosis not present

## 2015-04-07 DIAGNOSIS — F0280 Dementia in other diseases classified elsewhere without behavioral disturbance: Secondary | ICD-10-CM | POA: Diagnosis not present

## 2015-04-07 DIAGNOSIS — R2681 Unsteadiness on feet: Secondary | ICD-10-CM | POA: Diagnosis not present

## 2015-04-07 DIAGNOSIS — G309 Alzheimer's disease, unspecified: Secondary | ICD-10-CM | POA: Diagnosis not present

## 2015-04-07 DIAGNOSIS — F418 Other specified anxiety disorders: Secondary | ICD-10-CM | POA: Diagnosis not present

## 2015-04-09 DIAGNOSIS — R531 Weakness: Secondary | ICD-10-CM | POA: Diagnosis not present

## 2015-04-09 DIAGNOSIS — F0280 Dementia in other diseases classified elsewhere without behavioral disturbance: Secondary | ICD-10-CM | POA: Diagnosis not present

## 2015-04-09 DIAGNOSIS — I10 Essential (primary) hypertension: Secondary | ICD-10-CM | POA: Diagnosis not present

## 2015-04-09 DIAGNOSIS — G309 Alzheimer's disease, unspecified: Secondary | ICD-10-CM | POA: Diagnosis not present

## 2015-04-09 DIAGNOSIS — F418 Other specified anxiety disorders: Secondary | ICD-10-CM | POA: Diagnosis not present

## 2015-04-09 DIAGNOSIS — R2681 Unsteadiness on feet: Secondary | ICD-10-CM | POA: Diagnosis not present

## 2015-04-10 ENCOUNTER — Ambulatory Visit: Payer: Medicare Other | Admitting: Nurse Practitioner

## 2015-04-10 DIAGNOSIS — R2681 Unsteadiness on feet: Secondary | ICD-10-CM | POA: Diagnosis not present

## 2015-04-10 DIAGNOSIS — R531 Weakness: Secondary | ICD-10-CM | POA: Diagnosis not present

## 2015-04-10 DIAGNOSIS — F418 Other specified anxiety disorders: Secondary | ICD-10-CM | POA: Diagnosis not present

## 2015-04-10 DIAGNOSIS — G309 Alzheimer's disease, unspecified: Secondary | ICD-10-CM | POA: Diagnosis not present

## 2015-04-10 DIAGNOSIS — I10 Essential (primary) hypertension: Secondary | ICD-10-CM | POA: Diagnosis not present

## 2015-04-10 DIAGNOSIS — F0280 Dementia in other diseases classified elsewhere without behavioral disturbance: Secondary | ICD-10-CM | POA: Diagnosis not present

## 2015-04-11 DIAGNOSIS — F418 Other specified anxiety disorders: Secondary | ICD-10-CM | POA: Diagnosis not present

## 2015-04-11 DIAGNOSIS — R531 Weakness: Secondary | ICD-10-CM | POA: Diagnosis not present

## 2015-04-11 DIAGNOSIS — F0280 Dementia in other diseases classified elsewhere without behavioral disturbance: Secondary | ICD-10-CM | POA: Diagnosis not present

## 2015-04-11 DIAGNOSIS — G309 Alzheimer's disease, unspecified: Secondary | ICD-10-CM | POA: Diagnosis not present

## 2015-04-11 DIAGNOSIS — I10 Essential (primary) hypertension: Secondary | ICD-10-CM | POA: Diagnosis not present

## 2015-04-11 DIAGNOSIS — R2681 Unsteadiness on feet: Secondary | ICD-10-CM | POA: Diagnosis not present

## 2015-04-12 DIAGNOSIS — F418 Other specified anxiety disorders: Secondary | ICD-10-CM | POA: Diagnosis not present

## 2015-04-12 DIAGNOSIS — R2681 Unsteadiness on feet: Secondary | ICD-10-CM | POA: Diagnosis not present

## 2015-04-12 DIAGNOSIS — G309 Alzheimer's disease, unspecified: Secondary | ICD-10-CM | POA: Diagnosis not present

## 2015-04-12 DIAGNOSIS — I10 Essential (primary) hypertension: Secondary | ICD-10-CM | POA: Diagnosis not present

## 2015-04-12 DIAGNOSIS — R531 Weakness: Secondary | ICD-10-CM | POA: Diagnosis not present

## 2015-04-12 DIAGNOSIS — F0280 Dementia in other diseases classified elsewhere without behavioral disturbance: Secondary | ICD-10-CM | POA: Diagnosis not present

## 2015-04-14 DIAGNOSIS — I10 Essential (primary) hypertension: Secondary | ICD-10-CM | POA: Diagnosis not present

## 2015-04-14 DIAGNOSIS — F418 Other specified anxiety disorders: Secondary | ICD-10-CM | POA: Diagnosis not present

## 2015-04-14 DIAGNOSIS — R2681 Unsteadiness on feet: Secondary | ICD-10-CM | POA: Diagnosis not present

## 2015-04-14 DIAGNOSIS — R531 Weakness: Secondary | ICD-10-CM | POA: Diagnosis not present

## 2015-04-14 DIAGNOSIS — G309 Alzheimer's disease, unspecified: Secondary | ICD-10-CM | POA: Diagnosis not present

## 2015-04-14 DIAGNOSIS — F0280 Dementia in other diseases classified elsewhere without behavioral disturbance: Secondary | ICD-10-CM | POA: Diagnosis not present

## 2015-04-17 DIAGNOSIS — F418 Other specified anxiety disorders: Secondary | ICD-10-CM | POA: Diagnosis not present

## 2015-04-17 DIAGNOSIS — R2681 Unsteadiness on feet: Secondary | ICD-10-CM | POA: Diagnosis not present

## 2015-04-17 DIAGNOSIS — G309 Alzheimer's disease, unspecified: Secondary | ICD-10-CM | POA: Diagnosis not present

## 2015-04-17 DIAGNOSIS — F0280 Dementia in other diseases classified elsewhere without behavioral disturbance: Secondary | ICD-10-CM | POA: Diagnosis not present

## 2015-04-17 DIAGNOSIS — R531 Weakness: Secondary | ICD-10-CM | POA: Diagnosis not present

## 2015-04-17 DIAGNOSIS — I10 Essential (primary) hypertension: Secondary | ICD-10-CM | POA: Diagnosis not present

## 2015-04-18 DIAGNOSIS — F418 Other specified anxiety disorders: Secondary | ICD-10-CM | POA: Diagnosis not present

## 2015-04-18 DIAGNOSIS — G309 Alzheimer's disease, unspecified: Secondary | ICD-10-CM | POA: Diagnosis not present

## 2015-04-18 DIAGNOSIS — F0280 Dementia in other diseases classified elsewhere without behavioral disturbance: Secondary | ICD-10-CM | POA: Diagnosis not present

## 2015-04-18 DIAGNOSIS — R531 Weakness: Secondary | ICD-10-CM | POA: Diagnosis not present

## 2015-04-18 DIAGNOSIS — I10 Essential (primary) hypertension: Secondary | ICD-10-CM | POA: Diagnosis not present

## 2015-04-18 DIAGNOSIS — R2681 Unsteadiness on feet: Secondary | ICD-10-CM | POA: Diagnosis not present

## 2015-04-20 DIAGNOSIS — G309 Alzheimer's disease, unspecified: Secondary | ICD-10-CM | POA: Diagnosis not present

## 2015-04-20 DIAGNOSIS — F418 Other specified anxiety disorders: Secondary | ICD-10-CM | POA: Diagnosis not present

## 2015-04-20 DIAGNOSIS — R2681 Unsteadiness on feet: Secondary | ICD-10-CM | POA: Diagnosis not present

## 2015-04-20 DIAGNOSIS — I10 Essential (primary) hypertension: Secondary | ICD-10-CM | POA: Diagnosis not present

## 2015-04-20 DIAGNOSIS — R531 Weakness: Secondary | ICD-10-CM | POA: Diagnosis not present

## 2015-04-20 DIAGNOSIS — F0280 Dementia in other diseases classified elsewhere without behavioral disturbance: Secondary | ICD-10-CM | POA: Diagnosis not present

## 2015-04-22 DIAGNOSIS — D6489 Other specified anemias: Secondary | ICD-10-CM | POA: Diagnosis not present

## 2015-04-22 DIAGNOSIS — I10 Essential (primary) hypertension: Secondary | ICD-10-CM | POA: Diagnosis not present

## 2015-04-22 DIAGNOSIS — G309 Alzheimer's disease, unspecified: Secondary | ICD-10-CM | POA: Diagnosis not present

## 2015-04-22 DIAGNOSIS — Z6830 Body mass index (BMI) 30.0-30.9, adult: Secondary | ICD-10-CM | POA: Diagnosis not present

## 2015-04-22 DIAGNOSIS — R55 Syncope and collapse: Secondary | ICD-10-CM | POA: Diagnosis not present

## 2015-04-22 DIAGNOSIS — M109 Gout, unspecified: Secondary | ICD-10-CM | POA: Diagnosis not present

## 2015-04-25 DIAGNOSIS — R2681 Unsteadiness on feet: Secondary | ICD-10-CM | POA: Diagnosis not present

## 2015-04-25 DIAGNOSIS — F0280 Dementia in other diseases classified elsewhere without behavioral disturbance: Secondary | ICD-10-CM | POA: Diagnosis not present

## 2015-04-25 DIAGNOSIS — F418 Other specified anxiety disorders: Secondary | ICD-10-CM | POA: Diagnosis not present

## 2015-04-25 DIAGNOSIS — R531 Weakness: Secondary | ICD-10-CM | POA: Diagnosis not present

## 2015-04-25 DIAGNOSIS — I10 Essential (primary) hypertension: Secondary | ICD-10-CM | POA: Diagnosis not present

## 2015-04-25 DIAGNOSIS — G309 Alzheimer's disease, unspecified: Secondary | ICD-10-CM | POA: Diagnosis not present

## 2015-04-27 DIAGNOSIS — G309 Alzheimer's disease, unspecified: Secondary | ICD-10-CM | POA: Diagnosis not present

## 2015-04-27 DIAGNOSIS — F0280 Dementia in other diseases classified elsewhere without behavioral disturbance: Secondary | ICD-10-CM | POA: Diagnosis not present

## 2015-04-27 DIAGNOSIS — F418 Other specified anxiety disorders: Secondary | ICD-10-CM | POA: Diagnosis not present

## 2015-04-27 DIAGNOSIS — R2681 Unsteadiness on feet: Secondary | ICD-10-CM | POA: Diagnosis not present

## 2015-04-27 DIAGNOSIS — R531 Weakness: Secondary | ICD-10-CM | POA: Diagnosis not present

## 2015-04-27 DIAGNOSIS — I10 Essential (primary) hypertension: Secondary | ICD-10-CM | POA: Diagnosis not present

## 2015-04-28 DIAGNOSIS — R531 Weakness: Secondary | ICD-10-CM | POA: Diagnosis not present

## 2015-04-28 DIAGNOSIS — F0280 Dementia in other diseases classified elsewhere without behavioral disturbance: Secondary | ICD-10-CM | POA: Diagnosis not present

## 2015-04-28 DIAGNOSIS — R2681 Unsteadiness on feet: Secondary | ICD-10-CM | POA: Diagnosis not present

## 2015-04-28 DIAGNOSIS — F418 Other specified anxiety disorders: Secondary | ICD-10-CM | POA: Diagnosis not present

## 2015-04-28 DIAGNOSIS — I10 Essential (primary) hypertension: Secondary | ICD-10-CM | POA: Diagnosis not present

## 2015-04-28 DIAGNOSIS — G309 Alzheimer's disease, unspecified: Secondary | ICD-10-CM | POA: Diagnosis not present

## 2015-05-01 DIAGNOSIS — R2681 Unsteadiness on feet: Secondary | ICD-10-CM | POA: Diagnosis not present

## 2015-05-01 DIAGNOSIS — I10 Essential (primary) hypertension: Secondary | ICD-10-CM | POA: Diagnosis not present

## 2015-05-01 DIAGNOSIS — F0280 Dementia in other diseases classified elsewhere without behavioral disturbance: Secondary | ICD-10-CM | POA: Diagnosis not present

## 2015-05-01 DIAGNOSIS — R531 Weakness: Secondary | ICD-10-CM | POA: Diagnosis not present

## 2015-05-01 DIAGNOSIS — F418 Other specified anxiety disorders: Secondary | ICD-10-CM | POA: Diagnosis not present

## 2015-05-01 DIAGNOSIS — G309 Alzheimer's disease, unspecified: Secondary | ICD-10-CM | POA: Diagnosis not present

## 2015-05-02 DIAGNOSIS — G309 Alzheimer's disease, unspecified: Secondary | ICD-10-CM | POA: Diagnosis not present

## 2015-05-02 DIAGNOSIS — R531 Weakness: Secondary | ICD-10-CM | POA: Diagnosis not present

## 2015-05-02 DIAGNOSIS — I10 Essential (primary) hypertension: Secondary | ICD-10-CM | POA: Diagnosis not present

## 2015-05-02 DIAGNOSIS — F0280 Dementia in other diseases classified elsewhere without behavioral disturbance: Secondary | ICD-10-CM | POA: Diagnosis not present

## 2015-05-02 DIAGNOSIS — F418 Other specified anxiety disorders: Secondary | ICD-10-CM | POA: Diagnosis not present

## 2015-05-02 DIAGNOSIS — R2681 Unsteadiness on feet: Secondary | ICD-10-CM | POA: Diagnosis not present

## 2015-05-03 DIAGNOSIS — R2681 Unsteadiness on feet: Secondary | ICD-10-CM | POA: Diagnosis not present

## 2015-05-03 DIAGNOSIS — R531 Weakness: Secondary | ICD-10-CM | POA: Diagnosis not present

## 2015-05-03 DIAGNOSIS — F0280 Dementia in other diseases classified elsewhere without behavioral disturbance: Secondary | ICD-10-CM | POA: Diagnosis not present

## 2015-05-03 DIAGNOSIS — G309 Alzheimer's disease, unspecified: Secondary | ICD-10-CM | POA: Diagnosis not present

## 2015-05-03 DIAGNOSIS — I10 Essential (primary) hypertension: Secondary | ICD-10-CM | POA: Diagnosis not present

## 2015-05-03 DIAGNOSIS — F418 Other specified anxiety disorders: Secondary | ICD-10-CM | POA: Diagnosis not present

## 2015-05-04 DIAGNOSIS — I10 Essential (primary) hypertension: Secondary | ICD-10-CM | POA: Diagnosis not present

## 2015-05-04 DIAGNOSIS — R2681 Unsteadiness on feet: Secondary | ICD-10-CM | POA: Diagnosis not present

## 2015-05-04 DIAGNOSIS — R531 Weakness: Secondary | ICD-10-CM | POA: Diagnosis not present

## 2015-05-04 DIAGNOSIS — F418 Other specified anxiety disorders: Secondary | ICD-10-CM | POA: Diagnosis not present

## 2015-05-04 DIAGNOSIS — F0280 Dementia in other diseases classified elsewhere without behavioral disturbance: Secondary | ICD-10-CM | POA: Diagnosis not present

## 2015-05-04 DIAGNOSIS — G309 Alzheimer's disease, unspecified: Secondary | ICD-10-CM | POA: Diagnosis not present

## 2015-05-08 DIAGNOSIS — F0280 Dementia in other diseases classified elsewhere without behavioral disturbance: Secondary | ICD-10-CM | POA: Diagnosis not present

## 2015-05-08 DIAGNOSIS — R2681 Unsteadiness on feet: Secondary | ICD-10-CM | POA: Diagnosis not present

## 2015-05-08 DIAGNOSIS — R531 Weakness: Secondary | ICD-10-CM | POA: Diagnosis not present

## 2015-05-08 DIAGNOSIS — I10 Essential (primary) hypertension: Secondary | ICD-10-CM | POA: Diagnosis not present

## 2015-05-08 DIAGNOSIS — F418 Other specified anxiety disorders: Secondary | ICD-10-CM | POA: Diagnosis not present

## 2015-05-08 DIAGNOSIS — G309 Alzheimer's disease, unspecified: Secondary | ICD-10-CM | POA: Diagnosis not present

## 2015-05-10 DIAGNOSIS — R531 Weakness: Secondary | ICD-10-CM | POA: Diagnosis not present

## 2015-05-10 DIAGNOSIS — F418 Other specified anxiety disorders: Secondary | ICD-10-CM | POA: Diagnosis not present

## 2015-05-10 DIAGNOSIS — F0280 Dementia in other diseases classified elsewhere without behavioral disturbance: Secondary | ICD-10-CM | POA: Diagnosis not present

## 2015-05-10 DIAGNOSIS — I10 Essential (primary) hypertension: Secondary | ICD-10-CM | POA: Diagnosis not present

## 2015-05-10 DIAGNOSIS — G309 Alzheimer's disease, unspecified: Secondary | ICD-10-CM | POA: Diagnosis not present

## 2015-05-10 DIAGNOSIS — R2681 Unsteadiness on feet: Secondary | ICD-10-CM | POA: Diagnosis not present

## 2015-05-15 DIAGNOSIS — I10 Essential (primary) hypertension: Secondary | ICD-10-CM | POA: Diagnosis not present

## 2015-05-15 DIAGNOSIS — R531 Weakness: Secondary | ICD-10-CM | POA: Diagnosis not present

## 2015-05-15 DIAGNOSIS — F418 Other specified anxiety disorders: Secondary | ICD-10-CM | POA: Diagnosis not present

## 2015-05-15 DIAGNOSIS — G309 Alzheimer's disease, unspecified: Secondary | ICD-10-CM | POA: Diagnosis not present

## 2015-05-15 DIAGNOSIS — R2681 Unsteadiness on feet: Secondary | ICD-10-CM | POA: Diagnosis not present

## 2015-05-15 DIAGNOSIS — F0280 Dementia in other diseases classified elsewhere without behavioral disturbance: Secondary | ICD-10-CM | POA: Diagnosis not present

## 2015-06-20 ENCOUNTER — Other Ambulatory Visit: Payer: Self-pay | Admitting: Neurology

## 2015-06-20 ENCOUNTER — Ambulatory Visit: Payer: Medicare Other | Admitting: Nurse Practitioner

## 2015-06-28 ENCOUNTER — Ambulatory Visit (INDEPENDENT_AMBULATORY_CARE_PROVIDER_SITE_OTHER): Payer: Medicare Other | Admitting: Nurse Practitioner

## 2015-06-28 ENCOUNTER — Encounter: Payer: Self-pay | Admitting: Nurse Practitioner

## 2015-06-28 VITALS — BP 152/74 | HR 72 | Ht 59.0 in | Wt 143.6 lb

## 2015-06-28 DIAGNOSIS — F0390 Unspecified dementia without behavioral disturbance: Secondary | ICD-10-CM | POA: Diagnosis not present

## 2015-06-28 NOTE — Progress Notes (Signed)
[facility]  PATIENT: SELLA BUYS DOB: 23-Jun-1932   REASON FOR VISIT: Follow-up for dementia HISTORY FROM: Daughter    HISTORY OF PRESENT ILLNESS: Ms. Cates, 82 year old female returns for follow-up. She has a history of dementia. 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 She was hospitalized after a fall went to rehabilitation and is now back at home. Her daughter is her primary caregiver and she is dependent for all activities of daily living. She can feed herself. Her Mini-Mental Status exam score has declined over the years but is actually a few points better than when last seen by Dr. Brett Fairy 02/20/2015. There have been no safety issues. There has been no wandering behavior. She is sleeping well at night. She takes trazodone. She returns for reevaluation she is on Razadyne without side effects.   HISTORY CD 82 year old caucasian female, who is a long established patient in our practice and carries the diagnosis of dementia, last seen by Dr. Brett Fairy 04/22/2013 for tension headaches. MRI at that time showed Mesio-temporal atrophy indicative of memory loss disorder , As in Alzheimer's. small vessel disease has progressed since 2007. She has been on Excelon and Ambien and could not tolerate it, changed to Galantamine ER at 16 mg and she has not had side effects. Last EEG 2013 was remarkably normal . Mrs. Bangs is no longer independent with ADL's.She requires 24/7 assistance . She is more and ore unable to walk. Daughter requests PT for gait training.  Her Mini-Mental status exam score is the same from 2014 through 2016 at 20 out of 30. Now declined Her insomnia is better since shewas started on Trazodone with Dr. Forde Dandy. She has had several hospital ER visits most recently for hyponatremia. She is now on fluid restriction, she is passive and sleeping most of the day.  She is still not easily motivated to participate  in activities. She is very passive, does not exercise, does no answer, appears limited - verbally . Appetite good. Sleeps well now. She returns for reevaluation   REVIEW OF SYSTEMS: Full 14 system review of systems performed and notable only for those listed, all others are neg:  Constitutional: neg  Cardiovascular: neg Ear/Nose/Throat: neg  Skin: neg Eyes: neg Respiratory: neg Gastroitestinal: neg  Hematology/Lymphatic: Anemia  Endocrine: neg Musculoskeletal:neg Allergy/Immunology: neg Neurological: Memory loss Psychiatric: neg Sleep : neg   ALLERGIES:PCN  HOME MEDICATIONS: Outpatient Prescriptions Prior to Visit  Medication Sig Dispense Refill  . esomeprazole (NEXIUM) 40 MG capsule Take 40 mg by mouth daily at 12 noon.    . feeding supplement, ENSURE ENLIVE, (ENSURE ENLIVE) LIQD Take 237 mLs by mouth 3 (three) times daily as needed (offer (strawberry) ensure to patient if meal completion is less than 50%). 237 mL 12  . galantamine (RAZADYNE ER) 16 MG 24 hr capsule TAKE ONE CAPSULE BY MOUTH EVERY DAY WITH BREAKFAST 30 capsule 6  . iron polysaccharides (NU-IRON) 150 MG capsule Take 150-300 mg by mouth 3 (three) times daily. Take 1 capsule in the morning Take 2 capsules at lunch & dinner    . levothyroxine (SYNTHROID, LEVOTHROID) 125 MCG tablet Take 125 mcg by mouth daily before breakfast.    . Multiple Vitamin (MULTIVITAMIN WITH MINERALS) TABS tablet Take 1 tablet by mouth daily. 30 tablet 0  . nisoldipine (SULAR) 17 MG 24 hr tablet Take 17 mg by mouth daily.     Marland Kitchen PRISTIQ 50 MG 24 hr tablet Take 50 mg by mouth daily.     Marland Kitchen  traZODone (DESYREL) 50 MG tablet Take 25 mg by mouth at bedtime.     . valsartan (DIOVAN) 320 MG tablet Take 320 mg by mouth daily.    . Vitamin D, Ergocalciferol, (DRISDOL) 50000 UNITS CAPS capsule Take 50,000 Units by mouth every 7 (seven) days. Tuesday     No facility-administered medications prior to visit.    PAST MEDICAL HISTORY: Past Medical  History  Diagnosis Date  . DUB (dysfunctional uterine bleeding)   . Thyroid disease     Hypothyroid  . Hypertension   . Dementia   . Elevated cholesterol   . Kidney stone   . Hyperlipidemia   . Depression with anxiety   . Sleep apnea   . Adenomatous colon polyp     PAST SURGICAL HISTORY: Past Surgical History  Procedure Laterality Date  . Cholecystectomy    . Kidney stones    . Colon tumor-benign    . Abdominal hysterectomy    . Appendectomy      FAMILY HISTORY: Family History  Problem Relation Age of Onset  . Hypertension Brother   . Heart disease Brother   . Diabetes type II Brother   . Diabetes type II Sister   . Heart disease Father   . Diabetes Mother   . Thyroid disease Child   . Diabetes Child     SOCIAL HISTORY: Social History   Social History  . Marital Status: Married    Spouse Name: [name]  . Number of Children: 4  . Years of Education: 8   Occupational History  .     Social History Main Topics  . Smoking status: Never Smoker   . Smokeless tobacco: Never Used  . Alcohol Use: No  . Drug Use: No  . Sexual Activity: No   Other Topics Concern  . Not on file   Social History Narrative   Patient is married [name]).   Patient has four children.   Patient has an 8th grade education.   Patient is retired.   Patient is right-handed.   Patient drinks 3- 2 liters of Pepsi daily.     PHYSICAL EXAM  Filed Vitals:   06/28/15 1521  BP: 152/74  Pulse: 72  Height: 4\' 11"  (1.499 m)  Weight: 143 lb 9.6 oz (65.137 kg)   Body mass index is 28.99 kg/(m^2). Generalized: Well developed, in no acute distress  Head: normocephalic and atraumatic,. Oropharynx benign  Neck: Supple, no carotid bruits  Cardiac: Regular rate rhythm, no murmur  Musculoskeletal: No deformity   Neurological examination   Mentation: Alert MMSE 13/30. Last 10/30. Marland Kitchen AFT 2. Follows most  commands , little speech and language fluent.   Cranial nerve II-XII: Pupils  were equal round reactive to light extraocular movements were full, visual field were full on confrontational test. Facial sensation and strength were normal. hearing was intact to finger rubbing bilaterally. Uvula tongue midline. head turning and shoulder shrug were normal and symmetric.Tongue protrusion into cheek strength was normal. Motor: normal bulk and tone, full strength in the BUE, BLE, fine finger movements normal, no pronator drift.  Sensory: normal and symmetric to light touch, pinprick, and Vibration, Coordination: finger-nose-finger,  no dysmetria Reflexes: Symmetric upper and lower, plantar responses were flexor bilaterally. Gait and Station: Rising up from seated position without assistance, wide based  stance, moderate stride, ambulated 30 feet, no difficulty with turns , unsteady with tandem no assistive device DIAGNOSTIC DATA (LABS, IMAGING, TESTING) - I reviewed patient records, labs, notes, testing and  imaging myself where available.  Lab Results  Component Value Date   WBC 7.1 03/11/2015   HGB 7.9* 03/11/2015   HCT 24.5* 03/11/2015   MCV 75.4* 03/11/2015   PLT 363 03/11/2015      Component Value Date/Time   NA 130* 03/11/2015 0406   NA 131* 04/28/2013 1021   K 4.3 03/11/2015 0406   CL 102 03/11/2015 0406   CO2 19* 03/11/2015 0406   GLUCOSE 105* 03/11/2015 0406   GLUCOSE 96 04/28/2013 1021   BUN 22* 03/11/2015 0406   BUN 11 04/28/2013 1021   CREATININE 1.18* 03/11/2015 0406   CALCIUM 8.8* 03/11/2015 0406   PROT 6.2* 03/09/2015 0547   PROT 6.8 04/28/2013 1021   ALBUMIN 2.2* 03/09/2015 0547   ALBUMIN 4.4 04/28/2013 1021   AST 31 03/09/2015 0547   ALT 18 03/09/2015 0547   ALKPHOS 104 03/09/2015 0547   BILITOT 1.2 03/09/2015 0547   GFRNONAA 42* 03/11/2015 0406   GFRAA 49* 03/11/2015 0406    Lab Results  Component Value Date   VITAMINB12 323 03/08/2015   Lab Results  Component Value Date   TSH 1.771 03/09/2015      ASSESSMENT AND PLAN 82 y.o.  year old female has a past medical history of Dementia of Alzheimer's disease as well as vascular component. The patient has a severe dementia.MMSE 13/30.  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 Her primary care physician has restricted her fluid intake to help her preserve a normal sodium level.  PLAN: Continue Razadyne at current dose  Create a safe environment, remove locks on bathroom  doors Reduce confusion, keep familiar objects and people around, stick to a routine Use effective communication such as simple words and short sentences Reduce nighttime restlessness, a consistent nighttime routine,  avoid napping during the day Encourage good nutrition and hydration Seek medical care for acute worsening confusion or fever, this usually indicates infection Given information on community resources, respite services, adult center for enrichment, and [place] F/U in 6 months Vst time 25 min Dennie Bible, [facility], [facility], [facility] [address], [place], [place] [address] [phone]

## 2015-06-28 NOTE — Patient Instructions (Signed)
Continue Razadyne at current dose F/U in 6 months

## 2015-06-29 NOTE — Progress Notes (Signed)
I agree with the assessment and plan as directed by NP .The patient is known to me .   Desiree Daise, MD  

## 2015-07-11 DIAGNOSIS — I10 Essential (primary) hypertension: Secondary | ICD-10-CM | POA: Diagnosis not present

## 2015-07-11 DIAGNOSIS — M109 Gout, unspecified: Secondary | ICD-10-CM | POA: Diagnosis not present

## 2015-07-11 DIAGNOSIS — Z6828 Body mass index (BMI) 28.0-28.9, adult: Secondary | ICD-10-CM | POA: Diagnosis not present

## 2015-07-11 DIAGNOSIS — E871 Hypo-osmolality and hyponatremia: Secondary | ICD-10-CM | POA: Diagnosis not present

## 2015-07-11 DIAGNOSIS — E559 Vitamin D deficiency, unspecified: Secondary | ICD-10-CM | POA: Diagnosis not present

## 2015-07-11 DIAGNOSIS — E784 Other hyperlipidemia: Secondary | ICD-10-CM | POA: Diagnosis not present

## 2015-07-11 DIAGNOSIS — G308 Other Alzheimer's disease: Secondary | ICD-10-CM | POA: Diagnosis not present

## 2015-07-11 DIAGNOSIS — E038 Other specified hypothyroidism: Secondary | ICD-10-CM | POA: Diagnosis not present

## 2015-07-11 DIAGNOSIS — D508 Other iron deficiency anemias: Secondary | ICD-10-CM | POA: Diagnosis not present

## 2015-08-17 ENCOUNTER — Emergency Department (HOSPITAL_COMMUNITY): Payer: Medicare Other

## 2015-08-17 ENCOUNTER — Encounter (HOSPITAL_COMMUNITY): Payer: Self-pay | Admitting: Emergency Medicine

## 2015-08-17 ENCOUNTER — Emergency Department (HOSPITAL_COMMUNITY)
Admission: EM | Admit: 2015-08-17 | Discharge: 2015-08-17 | Disposition: A | Discharge status: Home / Self Care | Payer: Medicare Other | Attending: Emergency Medicine | Admitting: Emergency Medicine

## 2015-08-17 DIAGNOSIS — R402411 Glasgow coma scale score 13-15, in the field [EMT or ambulance]: Secondary | ICD-10-CM | POA: Diagnosis not present

## 2015-08-17 DIAGNOSIS — F329 Major depressive disorder, single episode, unspecified: Secondary | ICD-10-CM | POA: Diagnosis not present

## 2015-08-17 DIAGNOSIS — E785 Hyperlipidemia, unspecified: Secondary | ICD-10-CM | POA: Insufficient documentation

## 2015-08-17 DIAGNOSIS — I10 Essential (primary) hypertension: Secondary | ICD-10-CM | POA: Diagnosis not present

## 2015-08-17 DIAGNOSIS — N390 Urinary tract infection, site not specified: Secondary | ICD-10-CM | POA: Diagnosis present

## 2015-08-17 DIAGNOSIS — F0391 Unspecified dementia with behavioral disturbance: Secondary | ICD-10-CM | POA: Diagnosis not present

## 2015-08-17 DIAGNOSIS — F0390 Unspecified dementia without behavioral disturbance: Secondary | ICD-10-CM | POA: Diagnosis not present

## 2015-08-17 DIAGNOSIS — R4182 Altered mental status, unspecified: Secondary | ICD-10-CM | POA: Diagnosis not present

## 2015-08-17 DIAGNOSIS — Z79899 Other long term (current) drug therapy: Secondary | ICD-10-CM | POA: Diagnosis not present

## 2015-08-17 LAB — CBC WITH DIFFERENTIAL/PLATELET
Basophils Absolute: 0 K/uL (ref 0.0–0.1)
Basophils Relative: 0 %
Eosinophils Absolute: 0.1 K/uL (ref 0.0–0.7)
Eosinophils Relative: 1 %
HCT: 42.2 % (ref 36.0–46.0)
Hemoglobin: 13.4 g/dL (ref 12.0–15.0)
Lymphocytes Relative: 29 %
Lymphs Abs: 2.9 K/uL (ref 0.7–4.0)
MCH: 25.3 pg — ABNORMAL LOW (ref 26.0–34.0)
MCHC: 31.8 g/dL (ref 30.0–36.0)
MCV: 79.8 fL (ref 78.0–100.0)
Monocytes Absolute: 0.8 K/uL (ref 0.1–1.0)
Monocytes Relative: 8 %
Neutro Abs: 6.2 K/uL (ref 1.7–7.7)
Neutrophils Relative %: 62 %
Platelets: 271 K/uL (ref 150–400)
RBC: 5.29 MIL/uL — ABNORMAL HIGH (ref 3.87–5.11)
RDW: 17.6 % — ABNORMAL HIGH (ref 11.5–15.5)
WBC: 10 K/uL (ref 4.0–10.5)

## 2015-08-17 LAB — COMPREHENSIVE METABOLIC PANEL WITH GFR
ALT: 15 U/L (ref 14–54)
AST: 23 U/L (ref 15–41)
Albumin: 4.7 g/dL (ref 3.5–5.0)
Alkaline Phosphatase: 79 U/L (ref 38–126)
Anion gap: 8 (ref 5–15)
BUN: 18 mg/dL (ref 6–20)
CO2: 24 mmol/L (ref 22–32)
Calcium: 9.9 mg/dL (ref 8.9–10.3)
Chloride: 106 mmol/L (ref 101–111)
Creatinine, Ser: 0.98 mg/dL (ref 0.44–1.00)
GFR calc Af Amer: >60 mL/min
GFR calc non Af Amer: 53 mL/min — ABNORMAL LOW
Glucose, Bld: 102 mg/dL — ABNORMAL HIGH (ref 65–99)
Potassium: 4.2 mmol/L (ref 3.5–5.1)
Sodium: 138 mmol/L (ref 135–145)
Total Bilirubin: 0.7 mg/dL (ref 0.3–1.2)
Total Protein: 8.8 g/dL — ABNORMAL HIGH (ref 6.5–8.1)

## 2015-08-17 LAB — ACETAMINOPHEN LEVEL: Acetaminophen (Tylenol), Serum: <10 ug/mL — ABNORMAL LOW (ref 10–30)

## 2015-08-17 LAB — RAPID URINE DRUG SCREEN, HOSP PERFORMED
Amphetamines: NOT DETECTED
Barbiturates: NOT DETECTED
Benzodiazepines: NOT DETECTED
Cocaine: NOT DETECTED
Opiates: NOT DETECTED
Tetrahydrocannabinol: NOT DETECTED

## 2015-08-17 LAB — URINALYSIS, ROUTINE W REFLEX MICROSCOPIC
Bilirubin Urine: NEGATIVE
Glucose, UA: NEGATIVE mg/dL
Hgb urine dipstick: NEGATIVE
Ketones, ur: NEGATIVE mg/dL
Leukocytes, UA: NEGATIVE
Nitrite: NEGATIVE
Protein, ur: NEGATIVE mg/dL
Specific Gravity, Urine: 1.006 (ref 1.005–1.030)
pH: 5.5 (ref 5.0–8.0)

## 2015-08-17 LAB — I-STAT TROPONIN, ED: Troponin i, poc: 0 ng/mL (ref 0.00–0.08)

## 2015-08-17 LAB — SALICYLATE LEVEL: Salicylate Lvl: <4 mg/dL (ref 2.8–30.0)

## 2015-08-17 LAB — ETHANOL: Alcohol, Ethyl (B): <5 mg/dL

## 2015-08-17 NOTE — ED Provider Notes (Signed)
Medical screening examination/treatment/procedure(s) were conducted as a shared visit with non-physician practitioner(s) and myself.  I personally evaluated the patient during the encounter.   EKG Interpretation   Date/Time:  Thursday August 17 2015 16:08:30 EDT Ventricular Rate:  79 PR Interval:    QRS Duration: 79 QT Interval:  363 QTC Calculation: 417 R Axis:   -9 Text Interpretation:  Sinus rhythm Low voltage, precordial leads No  significant change since last tracing Confirmed by Chino Sardo  MD, Halie Gass  ([id number]) on 08/17/2015 4:21:15 PM     Patient here after becoming more aggressive than normal. Does have a history of dementia. There was concern initially have a UTI but a urinalysis is negative. Patient's workup here is reassuring. According to the daughter, she is at her baseline. Will be discharged home  Lacretia Leigh, MD 08/17/15 1844

## 2015-08-17 NOTE — Discharge Instructions (Signed)

## 2015-08-17 NOTE — ED Notes (Signed)
Per EMS, patient is from home.  Family advised that patient was more aggressive and lethargic than normal.  Family states when she becomes this way it is usually an UTI.  Patient denies any complaints.   BP:122/70 P:96 R:18  CBG:136

## 2015-08-17 NOTE — ED Notes (Signed)
Patient unavailable for ekg at this time, patient in bathroom.

## 2015-08-17 NOTE — ED Provider Notes (Signed)
CSN: [id number]     Arrival date & time 08/17/15  1444 History   First MD Initiated Contact with Patient 08/17/15 1514     Chief Complaint  Patient presents with  . Urinary Tract Infection     (Consider location/radiation/quality/duration/timing/severity/associated sxs/prior Treatment) HPI   Brianna [name] is an 82 y.o F with a pmhx of dementia, HLD Who presents the ED brought in by her daughter for increased aggression and confusion today. Level V caveat, dementia. Patient's daughter states that when she woke up this morning she was a little more disoriented than normal. After eating lunch patient then became aggressive, swatting at her daughter and other family members. Patient also was very confused, thinking that she was a child again and requesting to see her "mommy". Patient's daughter states this is largely abnormal for the patient. They contacted the patient's PCP who recommended they come to the ED as this could be a result of a UTI, electrolyte imbalance or possibly a stroke. No focal weakness noted. No reported fevers, chills, dysuria, abdominal pain. No known drug or alcohol use.  Past Medical History  Diagnosis Date  . DUB (dysfunctional uterine bleeding)   . Thyroid disease     Hypothyroid  . Hypertension   . Dementia   . Elevated cholesterol   . Kidney stone   . Hyperlipidemia   . Depression with anxiety   . Sleep apnea   . Adenomatous colon polyp    Past Surgical History  Procedure Laterality Date  . Cholecystectomy    . Kidney stones    . Colon tumor-benign    . Abdominal hysterectomy    . Appendectomy     Family History  Problem Relation Age of Onset  . Hypertension Brother   . Heart disease Brother   . Diabetes type II Brother   . Diabetes type II Sister   . Heart disease Father   . Diabetes Mother   . Thyroid disease Child   . Diabetes Child    Social History  Substance Use Topics  . Smoking status: Never Smoker   . Smokeless tobacco: Never Used   . Alcohol Use: No   OB History    Gravida Para Term Preterm AB TAB SAB Ectopic Multiple Living   3 3 3       3      Review of Systems  All other systems reviewed and are negative.     Allergies  Penicillins  Home Medications   Prior to Admission medications   Medication Sig Start Date End Date Taking? Authorizing Provider  esomeprazole (NEXIUM) 40 MG capsule Take 40 mg by mouth daily.    Yes Historical Provider, MD  galantamine (RAZADYNE ER) 16 MG 24 hr capsule TAKE ONE CAPSULE BY MOUTH EVERY DAY WITH BREAKFAST 06/20/15  Yes Larey Seat, MD  iron polysaccharides (NU-IRON) 150 MG capsule Take 150-300 mg by mouth 3 (three) times daily. Take 1 capsule in the morning Take 2 capsules at lunch & dinner 06/02/12  Yes Historical Provider, MD  levothyroxine (SYNTHROID, LEVOTHROID) 100 MCG tablet Take 100 mcg by mouth daily. 07/30/15  Yes Historical Provider, MD  nisoldipine (SULAR) 17 MG 24 hr tablet Take 17 mg by mouth daily.  03/13/09  Yes Historical Provider, MD  PRISTIQ 50 MG 24 hr tablet Take 50 mg by mouth daily.  04/05/13  Yes Historical Provider, MD  traZODone (DESYREL) 50 MG tablet Take 25 mg by mouth at bedtime.  02/16/13  Yes Historical Provider, MD  valsartan (DIOVAN) 320 MG tablet Take 320 mg by mouth daily.   Yes Historical Provider, MD  Vitamin D, Ergocalciferol, (DRISDOL) 50000 UNITS CAPS capsule Take 50,000 Units by mouth every 7 (seven) days. Tuesday   Yes Historical Provider, MD  feeding supplement, ENSURE ENLIVE, (ENSURE ENLIVE) LIQD Take 237 mLs by mouth 3 (three) times daily as needed (offer (strawberry) ensure to patient if meal completion is less than 50%). Patient not taking: Reported on 08/17/2015 03/11/15   Cristal Ford, DO  Multiple Vitamin (MULTIVITAMIN WITH MINERALS) TABS tablet Take 1 tablet by mouth daily. Patient not taking: Reported on 08/17/2015 03/11/15   Maryann Mikhail, DO   BP 138/61 mmHg  Pulse 66  Temp(Src) 98.2 F (36.8 C) (Oral)  Resp 18  SpO2  97% Physical Exam  Constitutional: No distress.  Elderly  HENT:  Head: Normocephalic and atraumatic.  Mouth/Throat: No oropharyngeal exudate.  Eyes: Conjunctivae and EOM are normal. Pupils are equal, round, and reactive to light. Right eye exhibits no discharge. Left eye exhibits no discharge. No scleral icterus.  Cardiovascular: Normal rate, regular rhythm, normal heart sounds and intact distal pulses.  Exam reveals no gallop and no friction rub.   No murmur heard. Pulmonary/Chest: Effort normal and breath sounds normal. No respiratory distress. She has no wheezes. She has no rales. She exhibits no tenderness.  Abdominal: Soft. She exhibits no distension. There is no tenderness. There is no guarding.  Musculoskeletal: Normal range of motion. She exhibits no edema.  Neurological: She is alert. No cranial nerve deficit. She exhibits normal muscle tone. Coordination normal.  Oriented to self and place. Disoriented to time and situation. No dysmetria. Patient follows commands appropriately. No pronator drift. No facial droop. No slurred speech. Strength 5 out of 5 throughout. No sensory deficits.  Skin: Skin is warm and dry. No rash noted. She is not diaphoretic. No erythema. No pallor.  Psychiatric: She has a normal mood and affect. Her behavior is normal.  Nursing note and vitals reviewed.   ED Course  Procedures (including critical care time) Labs Review Labs Reviewed  COMPREHENSIVE METABOLIC PANEL - Abnormal; Notable for the following:    Glucose, Bld 102 (*)    Total Protein 8.8 (*)    GFR calc non Af Amer 53 (*)    All other components within normal limits  CBC WITH DIFFERENTIAL/PLATELET - Abnormal; Notable for the following:    RBC 5.29 (*)    MCH 25.3 (*)    RDW 17.6 (*)    All other components within normal limits  ACETAMINOPHEN LEVEL - Abnormal; Notable for the following:    Acetaminophen (Tylenol), Serum <10 (*)    All other components within normal limits  URINE CULTURE   URINALYSIS, ROUTINE W REFLEX MICROSCOPIC (NOT AT [facility])  ETHANOL  SALICYLATE LEVEL  URINE RAPID DRUG SCREEN, HOSP PERFORMED  I-STAT TROPOININ, ED    Imaging Review Ct Head Wo Contrast  08/17/2015  CLINICAL DATA:  Altered mental status.  Increased aggression. EXAM: CT HEAD WITHOUT CONTRAST TECHNIQUE: Contiguous axial images were obtained from the base of the skull through the vertex without intravenous contrast. COMPARISON:  Head CT scan 03/08/2015. FINDINGS: There is cortical atrophy and chronic microvascular ischemic change, stable in appearance. No evidence of acute intracranial abnormality including hemorrhage, infarct, mass lesion, mass effect, midline shift or abnormal extra-axial fluid collection. No hydrocephalus or pneumocephalus. There are some frothy secretions in the left sphenoid sinus. Imaged paranasal sinuses are otherwise clear. IMPRESSION: No acute abnormality. No  change in atrophy and chronic microvascular ischemic change. Mild appearing left sphenoid sinus disease. Electronically Signed   By: Inge Rise M.D.   On: 08/17/2015 16:48   I have personally reviewed and evaluated these images and lab results as part of my medical decision-making.   EKG Interpretation   Date/Time:  Thursday August 17 2015 16:08:30 EDT Ventricular Rate:  79 PR Interval:    QRS Duration: 79 QT Interval:  363 QTC Calculation: 417 R Axis:   -9 Text Interpretation:  Sinus rhythm Low voltage, precordial leads No  significant change since last tracing Confirmed by ALLEN  MD, ANTHONY  ([id number]) on 08/17/2015 4:21:15 PM      MDM   Final diagnoses:  Dementia, with behavioral disturbance   82 year old female with history of dementia presents the ED with increased aggression and confusion at home today. Patient appears on the ED, calm and cooperative. She is disoriented to time and situation but oriented to self. All vital signs are stable. UA negative for infection. CT head unremarkable. EKG,  troponin and all other lab work is unremarkable. Suspect that patient's increased confusion is an exacerbation of her underlying dementia. Patient's family states the patient is currently mentating at her baseline. Recommend follow-up with her PCP for reevaluation. Return precautions outlined in patient discharge instructions.  Patient was discussed with and seen by Dr. Zenia Resides who agrees with the treatment plan.    Dondra Spry [place], [place] 08/17/15 2058

## 2015-08-18 LAB — URINE CULTURE: Culture: NO GROWTH

## 2015-09-28 ENCOUNTER — Telehealth: Payer: Self-pay | Admitting: Neurology

## 2015-09-28 NOTE — Telephone Encounter (Signed)
Pt's daughter called in requesting an appt to see Dr. Brett Fairy. Her mother has increased memory loss and she is concerned. Can she be worked in ? Please call and advise

## 2015-09-28 NOTE — Telephone Encounter (Signed)
I spoke with pt's daughter, Jocelyn Lamer. She is requesting a sooner appt for pt because her memory seems to be worsening. I advised her that Dr. Brett Fairy is booked until October, but Hoyle Sauer, NP, whom they say last, has an opening next week. Pt's daughter is agreeable to seeing Hoyle Sauer, NP. Appt made with pt's daughter, pt's daughter verbalized understanding.

## 2015-10-06 ENCOUNTER — Ambulatory Visit (INDEPENDENT_AMBULATORY_CARE_PROVIDER_SITE_OTHER): Payer: Medicare Other | Admitting: Nurse Practitioner

## 2015-10-06 ENCOUNTER — Encounter: Payer: Self-pay | Admitting: Nurse Practitioner

## 2015-10-06 VITALS — BP 130/60 | HR 66 | Ht 59.0 in | Wt 138.6 lb

## 2015-10-06 DIAGNOSIS — G309 Alzheimer's disease, unspecified: Secondary | ICD-10-CM

## 2015-10-06 DIAGNOSIS — F0390 Unspecified dementia without behavioral disturbance: Secondary | ICD-10-CM

## 2015-10-06 DIAGNOSIS — F0280 Dementia in other diseases classified elsewhere without behavioral disturbance: Secondary | ICD-10-CM

## 2015-10-06 MED ORDER — GALANTAMINE HYDROBROMIDE ER 16 MG PO CP24: ORAL_CAPSULE | ORAL | 6 refills | Status: DC

## 2015-10-06 NOTE — Progress Notes (Addendum)
[facility]  PATIENT: Brianna [name] DOB: 1932/03/05   REASON FOR VISIT: Follow-up for dementia HISTORY FROM: Daughter    HISTORY OF PRESENT ILLNESS: [name], 82 year old female returns for follow-up. She has a history of dementia. 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 She was hospitalized after a fall went to rehabilitation and is now back at home. Her daughter is her primary caregiver and she is dependent for all activities of daily living except she can feed herself. Her Mini-Mental Status exam score has declined over the years MMSE today 9/30.  There have been no safety issues. There has been no wandering behavior. She does have episodes of agitation and discussed medication with daughter however she does not wish to use medications at this time. She is sleeping well at night. She takes trazodone. She returns for reevaluation she is on Razadyne without side effects. She has had no recent falls  HISTORY CD 82 year old caucasian female, who is a long established patient in our practice and carries the diagnosis of dementia, last seen by Brianna [name] 04/22/2013 for tension headaches. MRI at that time showed Mesio-temporal atrophy indicative of memory loss disorder , As in Alzheimer's. small vessel disease has progressed since 2007. She has been on Excelon and Ambien and could not tolerate it, changed to Galantamine ER at 16 mg and she has not had side effects. Last EEG 2013 was remarkably normal . Brianna [name] is no longer independent with ADL's.She requires 24/7 assistance . She is more and ore unable to walk. Daughter requests PT for gait training.  Her Mini-Mental status exam score is the same from 2014 through 2016 at 20 out of 30. Now declined Her insomnia is better since shewas started on Trazodone with Brianna [name]. She has had several hospital ER visits most recently for hyponatremia. She is now on fluid  restriction, she is passive and sleeping most of the day.  She is still not easily motivated to participate in activities. She is very passive, does not exercise, does no answer, appears limited - verbally . Appetite good. Sleeps well now. She returns for reevaluation   REVIEW OF SYSTEMS: Full 14 system review of systems performed and notable only for those listed, all others are neg:  Constitutional: neg  Cardiovascular: neg Ear/Nose/Throat: neg  Skin: neg Eyes: neg Respiratory: neg Gastroitestinal: neg  Hematology/Lymphatic:   Endocrine: neg Musculoskeletal:neg Allergy/Immunology: neg Neurological: Memory loss Psychiatric: Agitation and Sleep : neg   ALLERGIES:PCN  HOME MEDICATIONS: Outpatient Medications Prior to Visit  Medication Sig Dispense Refill  . esomeprazole (NEXIUM) 40 MG capsule Take 40 mg by mouth daily.     Marland Kitchen galantamine (RAZADYNE ER) 16 MG 24 hr capsule TAKE ONE CAPSULE BY MOUTH EVERY DAY WITH BREAKFAST 30 capsule 6  . iron polysaccharides (NU-IRON) 150 MG capsule Take 150-300 mg by mouth 3 (three) times daily. Take 1 capsule in the morning Take 2 capsules at lunch & dinner    . levothyroxine (SYNTHROID, LEVOTHROID) 100 MCG tablet Take 100 mcg by mouth daily.  5  . nisoldipine (SULAR) 17 MG 24 hr tablet Take 17 mg by mouth daily.     Marland Kitchen PRISTIQ 50 MG 24 hr tablet Take 50 mg by mouth daily.     . traZODone (DESYREL) 50 MG tablet Take 25 mg by mouth at bedtime.     . valsartan (DIOVAN) 320 MG tablet Take 320 mg by mouth daily.    . Vitamin D,  Ergocalciferol, (DRISDOL) 50000 UNITS CAPS capsule Take 50,000 Units by mouth every 7 (seven) days. Tuesday    . feeding supplement, ENSURE ENLIVE, (ENSURE ENLIVE) LIQD Take 237 mLs by mouth 3 (three) times daily as needed (offer (strawberry) ensure to patient if meal completion is less than 50%). (Patient not taking: Reported on 08/17/2015) 237 mL 12  . Multiple Vitamin (MULTIVITAMIN WITH MINERALS) TABS tablet Take 1  tablet by mouth daily. (Patient not taking: Reported on 08/17/2015) 30 tablet 0   No facility-administered medications prior to visit.     PAST MEDICAL HISTORY: Past Medical History:  Diagnosis Date  . Adenomatous colon polyp   . Dementia   . Depression with anxiety   . DUB (dysfunctional uterine bleeding)   . Elevated cholesterol   . Hyperlipidemia   . Hypertension   . Kidney stone   . Sleep apnea   . Thyroid disease    Hypothyroid    PAST SURGICAL HISTORY: Past Surgical History:  Procedure Laterality Date  . ABDOMINAL HYSTERECTOMY    . APPENDECTOMY    . CHOLECYSTECTOMY    . Colon tumor-benign    . kidney stones      FAMILY HISTORY: Family History  Problem Relation Age of Onset  . Diabetes Mother   . Heart disease Father   . Hypertension Brother   . Heart disease Brother   . Diabetes type II Brother   . Diabetes type II Sister   . Thyroid disease Child   . Diabetes Child     SOCIAL HISTORY: Social History   Social History  . Marital status: Married    Spouse name: Brianna [name]  . Number of children: 4  . Years of education: 8   Occupational History  .  Retired   Social History Main Topics  . Smoking status: Never Smoker  . Smokeless tobacco: Never Used  . Alcohol use No  . Drug use: No  . Sexual activity: No   Other Topics Concern  . Not on file   Social History Narrative   Patient is married Brianna [name]).   Patient has four children.   Patient has an 8th grade education.   Patient is retired.   Patient is right-handed.   Patient drinks 3- 2 liters of Pepsi daily.     PHYSICAL EXAM  Vitals:   10/06/15 1042  BP: 130/60  Pulse: 66  Weight: 138 lb 9.6 oz (62.9 kg)  Height: 4\' 11"  (1.499 m)   Body mass index is 27.99 kg/m. Generalized: Well developed, in no acute distress  Head: normocephalic and atraumatic,. Oropharynx benign  Neck: Supple, no carotid bruits  Cardiac: Regular rate rhythm, no murmur  Musculoskeletal: No deformity    Neurological examination   Mentation: Alert MMSE 9/30.  AFT 3. Follows most  commands , little speech and language fluent.   Cranial nerve II-XII: Pupils were equal round reactive to light extraocular movements were full, visual field were full on confrontational test. Facial sensation and strength were normal. hearing was intact to finger rubbing bilaterally. Uvula tongue midline. head turning and shoulder shrug were normal and symmetric.Tongue protrusion into cheek strength was normal. Motor: normal bulk and tone, full strength in the BUE, BLE, fine finger movements normal, no pronator drift.  Sensory: normal and symmetric to light touch, Coordination: finger-nose-finger,  no dysmetria Reflexes: Symmetric upper and lower, plantar responses were flexor bilaterally. Gait and Station: Rising up from seated position without assistance, wide based  stance, moderate stride, ambulated 30 feet, no  difficulty with turns , unsteady with tandem no assistive device Romberg negative DIAGNOSTIC DATA (LABS, IMAGING, TESTING) - I reviewed patient records, labs, notes, testing and imaging myself where available.  Lab Results  Component Value Date   WBC 10.0 08/17/2015   HGB 13.4 08/17/2015   HCT 42.2 08/17/2015   MCV 79.8 08/17/2015   PLT 271 08/17/2015      Component Value Date/Time   NA 138 08/17/2015 1621   NA 131 (L) 04/28/2013 1021   K 4.2 08/17/2015 1621   CL 106 08/17/2015 1621   CO2 24 08/17/2015 1621   GLUCOSE 102 (H) 08/17/2015 1621   BUN 18 08/17/2015 1621   BUN 11 04/28/2013 1021   CREATININE 0.98 08/17/2015 1621   CALCIUM 9.9 08/17/2015 1621   PROT 8.8 (H) 08/17/2015 1621   PROT 6.8 04/28/2013 1021   ALBUMIN 4.7 08/17/2015 1621   ALBUMIN 4.4 04/28/2013 1021   AST 23 08/17/2015 1621   ALT 15 08/17/2015 1621   ALKPHOS 79 08/17/2015 1621   BILITOT 0.7 08/17/2015 1621   GFRNONAA 53 (L) 08/17/2015 1621   GFRAA >60 08/17/2015 1621    Lab Results  Component Value Date    VITAMINB12 323 03/08/2015   Lab Results  Component Value Date   TSH 1.771 03/09/2015      ASSESSMENT AND PLAN 82 y.o. year old female has a past medical history of Dementia of Alzheimer's disease as well as vascular component. The patient has a severe dementia.MMSE 9/30.  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 Her primary care physician has restricted her fluid intake to help her preserve a normal sodium level.The patient is a current patient of Brianna [name] who is out of the office today . This note is sent to the work in doctor.     PLAN: Continue Razadyne at current dose will refill Reduce confusion, keep familiar objects and people around, stick to a routine Encourage good nutrition and hydration Try  community resources, respite services, adult center for enrichment, and [employer]. Given brochures on adult center for enrichment and local community resources for caregivers. For placement look for facilities that have dementia units Greater than 50% of time during this 25 minute visit was spent on counseling,explanation and education of diagnosis, planning of further management, discussion with daughter for continued coordination of care  and Community resources F/U in 6 months next with Brianna [name] Dennie Bible, [facility], [facility], [facility] [address] [place], [place] [address] [phone]  Personally have participated in and made any corrections needed to history, physical, neuro exam,assessment and plan as stated above.  I have personally discussed with NP, evaluated lab date, reviewed imaging studies and agree with radiology interpretations.    Sarina Ill, MD [facility]

## 2015-10-06 NOTE — Patient Instructions (Signed)
Continue Razadyne at current dose will refill Reduce confusion, keep familiar objects and people around, stick to a routine Encourage good nutrition and hydration Try  community resources, respite services, adult center for enrichment, and [facility] For placement look for facilities that have dementia units F/U in 6 months next with Dr. Brett Fairy

## 2015-12-28 ENCOUNTER — Ambulatory Visit: Payer: Medicare Other | Admitting: Nurse Practitioner

## 2016-02-26 DIAGNOSIS — E559 Vitamin D deficiency, unspecified: Secondary | ICD-10-CM | POA: Diagnosis not present

## 2016-02-26 DIAGNOSIS — D649 Anemia, unspecified: Secondary | ICD-10-CM | POA: Diagnosis not present

## 2016-02-26 DIAGNOSIS — E871 Hypo-osmolality and hyponatremia: Secondary | ICD-10-CM | POA: Diagnosis not present

## 2016-02-26 DIAGNOSIS — Z6825 Body mass index (BMI) 25.0-25.9, adult: Secondary | ICD-10-CM | POA: Diagnosis not present

## 2016-02-26 DIAGNOSIS — R269 Unspecified abnormalities of gait and mobility: Secondary | ICD-10-CM | POA: Diagnosis not present

## 2016-02-26 DIAGNOSIS — Z1389 Encounter for screening for other disorder: Secondary | ICD-10-CM | POA: Diagnosis not present

## 2016-02-26 DIAGNOSIS — G309 Alzheimer's disease, unspecified: Secondary | ICD-10-CM | POA: Diagnosis not present

## 2016-02-26 DIAGNOSIS — I10 Essential (primary) hypertension: Secondary | ICD-10-CM | POA: Diagnosis not present

## 2016-02-26 DIAGNOSIS — E038 Other specified hypothyroidism: Secondary | ICD-10-CM | POA: Diagnosis not present

## 2016-02-26 DIAGNOSIS — E784 Other hyperlipidemia: Secondary | ICD-10-CM | POA: Diagnosis not present

## 2016-02-26 DIAGNOSIS — Z23 Encounter for immunization: Secondary | ICD-10-CM | POA: Diagnosis not present

## 2016-03-06 ENCOUNTER — Telehealth: Payer: Self-pay | Admitting: Neurology

## 2016-03-06 NOTE — Telephone Encounter (Signed)
I spoke to daughter, she states that Dr. Baldwin Crown office advised her to call us about this referral. How do you want to proceed, so you want patient to come in for an appointment?

## 2016-03-06 NOTE — Telephone Encounter (Signed)
Please ask dr Forde Dandy about this , as he has recently seen her in person ,and I have not.

## 2016-03-06 NOTE — Telephone Encounter (Signed)
Patients daughter called stating patient is having trouble walking at all.  Patients daughter is calling to request if Dr. Brett Fairy suggest patient going back to rehab.  Please call

## 2016-03-06 NOTE — Telephone Encounter (Signed)
Daughter states that patient is having trouble walking again.  Reports that the last time this happened she took the pt to the ED and they referred her to an in house rehab center. Daughter thinks this will help her again but does not want to take her to the ED due to possibility of being exposed to the flu.  She asks if you are able to make this referral? Or do you have any further advice?  Denies any signs of infection, had recent visit with Dr. Forde Dandy, blood work came back normal (except for low potassium).

## 2016-03-07 NOTE — Telephone Encounter (Signed)
went to [facility] and did well there. Last visit in September with Hoyle Sauer. Needs face to face , make appointment with [facility] ASAP.

## 2016-03-11 ENCOUNTER — Encounter: Payer: Self-pay | Admitting: Adult Health

## 2016-03-11 ENCOUNTER — Ambulatory Visit (INDEPENDENT_AMBULATORY_CARE_PROVIDER_SITE_OTHER): Payer: Medicare Other | Admitting: Adult Health

## 2016-03-11 VITALS — BP 140/75 | HR 67 | Ht 59.0 in

## 2016-03-11 DIAGNOSIS — F0390 Unspecified dementia without behavioral disturbance: Secondary | ICD-10-CM | POA: Diagnosis not present

## 2016-03-11 DIAGNOSIS — R269 Unspecified abnormalities of gait and mobility: Secondary | ICD-10-CM

## 2016-03-11 NOTE — Patient Instructions (Signed)
Continue Razadyne Referral to physical therapy If your symptoms worsen or you develop new symptoms please let us know.

## 2016-03-11 NOTE — Progress Notes (Signed)
PATIENT: Brianna [name] DOB: 1932/04/05  REASON FOR VISIT: follow up- memory HISTORY FROM: patient  HISTORY OF PRESENT ILLNESS: Brianna [name] is an 83 year old female with a history of dementia. She returns today for an evaluation. She is currently on Razadyne and tolerating it well. She lives with her daughter. Daughter reports that last week she stayed in bed for 2 days. She states that the patient just did not want to get up. She states after that the patient had difficulty walking. She reports that this happened one time before and the patient had a go to physical therapy to help with mobility. She uses a Rollator when ambulating. The daughter denies any significant changes with her memory. She does have to assist her with most ADLs. The patient is able to feed herself. She uses trazodone to help her sleep. Denies any changes in mood or behavior. She returns today for an evaluation.  HISTORY 10/06/15 (CM): Brianna [name], 83 year old female returns for follow-up. She has a history of dementia. 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 She was hospitalized after a fall went to rehabilitation and is now back at home. Her daughter is her primary caregiver and she is dependent for all activities of daily living except she can feed herself. Her Mini-Mental Status exam score has declined over the years MMSE today 9/30.  There have been no safety issues. There has been no wandering behavior. She does have episodes of agitation and discussed medication with daughter however she does not wish to use medications at this time. She is sleeping well at night. She takes trazodone. She returns for reevaluation she is on Razadyne without side effects. She has had no recent falls  HISTORY CD 83 year old caucasian female, who is a long established patient in our practice and carries the diagnosis of dementia, last seen by Dr. Brett Fairy 04/22/2013 for tension headaches. MRI at that time  showed Mesio-temporal atrophy indicative of memory loss disorder , As in Alzheimer's. small vessel disease has progressed since 2007. She has been on Excelon and Ambien and could not tolerate it, changed to Galantamine ER at 16 mg and she has not had side effects. Last EEG 2013 was remarkably normal . Brianna [name] is no longer independent with ADL's.She requires 24/7 assistance . She is more and ore unable to walk. Daughter requests PT for gait training.  Her Mini-Mental status exam score is the same from 2014 through 2016 at 20 out of 30. Now declined Her insomnia is better since shewas started on Trazodone with Dr. Forde Dandy. She has had several hospital ER visits most recently for hyponatremia. She is now on fluid restriction, she is passive and sleeping most of the day.  She is still not easily motivated to participate in activities. She is very passive, does not exercise, does no answer, appears limited - verbally . Appetite good. Sleeps well now. She returns for reevaluation    REVIEW OF SYSTEMS: Out of a complete 14 system review of symptoms, the patient complains only of the following symptoms, and all other reviewed systems are negative.  Walking difficulty, activity change  ALLERGIES: Allergies  Allergen Reactions  . Penicillins Other (See Comments)    HOME MEDICATIONS: Outpatient Medications Prior to Visit  Medication Sig Dispense Refill  . esomeprazole (NEXIUM) 40 MG capsule Take 40 mg by mouth daily.     Marland Kitchen galantamine (RAZADYNE ER) 16 MG 24 hr capsule TAKE ONE CAPSULE BY MOUTH EVERY DAY WITH  BREAKFAST 30 capsule 6  . iron polysaccharides (NU-IRON) 150 MG capsule Take 150-300 mg by mouth 3 (three) times daily. Take 1 capsule in the morning Take 2 capsules at lunch & dinner    . levothyroxine (SYNTHROID, LEVOTHROID) 100 MCG tablet Take 100 mcg by mouth daily.  5  . nisoldipine (SULAR) 17 MG 24 hr tablet Take 17 mg by mouth daily.     Marland Kitchen PRISTIQ 50 MG 24 hr  tablet Take 50 mg by mouth daily.     . traZODone (DESYREL) 50 MG tablet Take 25 mg by mouth at bedtime.     . valsartan (DIOVAN) 320 MG tablet Take 320 mg by mouth daily.    . Vitamin D, Ergocalciferol, (DRISDOL) 50000 UNITS CAPS capsule Take 50,000 Units by mouth every 7 (seven) days. Tuesday     No facility-administered medications prior to visit.     PAST MEDICAL HISTORY: Past Medical History:  Diagnosis Date  . Adenomatous colon polyp   . Dementia   . Depression with anxiety   . DUB (dysfunctional uterine bleeding)   . Elevated cholesterol   . Hyperlipidemia   . Hypertension   . Kidney stone   . Sleep apnea   . Thyroid disease    Hypothyroid    PAST SURGICAL HISTORY: Past Surgical History:  Procedure Laterality Date  . ABDOMINAL HYSTERECTOMY    . APPENDECTOMY    . CHOLECYSTECTOMY    . Colon tumor-benign    . kidney stones      FAMILY HISTORY: Family History  Problem Relation Age of Onset  . Diabetes Mother   . Heart disease Father   . Hypertension Brother   . Heart disease Brother   . Diabetes type II Brother   . Diabetes type II Sister   . Thyroid disease Child   . Diabetes Child     SOCIAL HISTORY: Social History   Social History  . Marital status: Married    Spouse name: [name]  . Number of children: 4  . Years of education: 8   Occupational History  .  Retired   Social History Main Topics  . Smoking status: Never Smoker  . Smokeless tobacco: Never Used  . Alcohol use No  . Drug use: No  . Sexual activity: No   Other Topics Concern  . Not on file   Social History Narrative   Patient is married [name]).   Patient has four children.   Patient has an 8th grade education.   Patient is retired.   Patient is right-handed.   Patient drinks 3- 2 liters of Pepsi daily.      PHYSICAL EXAM  Vitals:   03/11/16 1056  BP: 140/75  Pulse: 67  Height: 4\' 11"  (1.499 m)   There is no height or weight on file to calculate BMI.  MMSE -  Mini Mental State Exam 10/06/2015 06/28/2015 02/20/2015  Orientation to time 1 0 0  Orientation to Place 0 2 2  Registration 3 2 3   Attention/ Calculation 0 2 1  Recall 0 0 0  Language- name 2 objects 1 2 1   Language- repeat 1 1 0  Language- follow 3 step command 3 3 3   Language- read & follow direction 0 0 0  Write a sentence 0 0 0  Copy design 0 0 0  Total score 9 12 10      Generalized: Well developed, in no acute distress   Neurological examination  Mentation: Alert. Follows all commands speech  and language fluent Cranial nerve II-XII: Pupils were equal round reactive to light. Extraocular movements were full, visual field were full on confrontational test. Facial sensation and strength were normal. Uvula tongue midline. Head turning and shoulder shrug  were normal and symmetric. Motor: The motor testing reveals 5 over 5 strength of all 4 extremities. Good symmetric motor tone is noted throughout.  Sensory: Sensory testing is intact to soft touch on all 4 extremities. No evidence of extinction is noted.  Coordination: Cerebellar testing reveals good finger-nose-finger and heel-to-shin bilaterally.  Gait and station: Patient was able to stand with assistance. She ambulated with Rollator without difficulty. Tandem gait not attempted    DIAGNOSTIC DATA (LABS, IMAGING, TESTING) - I reviewed patient records, labs, notes, testing and imaging myself where available.  Lab Results  Component Value Date   WBC 10.0 08/17/2015   HGB 13.4 08/17/2015   HCT 42.2 08/17/2015   MCV 79.8 08/17/2015   PLT 271 08/17/2015      Component Value Date/Time   NA 138 08/17/2015 1621   NA 131 (L) 04/28/2013 1021   K 4.2 08/17/2015 1621   CL 106 08/17/2015 1621   CO2 24 08/17/2015 1621   GLUCOSE 102 (H) 08/17/2015 1621   BUN 18 08/17/2015 1621   BUN 11 04/28/2013 1021   CREATININE 0.98 08/17/2015 1621   CALCIUM 9.9 08/17/2015 1621   PROT 8.8 (H) 08/17/2015 1621   PROT 6.8 04/28/2013 1021    ALBUMIN 4.7 08/17/2015 1621   ALBUMIN 4.4 04/28/2013 1021   AST 23 08/17/2015 1621   ALT 15 08/17/2015 1621   ALKPHOS 79 08/17/2015 1621   BILITOT 0.7 08/17/2015 1621   GFRNONAA 53 (L) 08/17/2015 1621   GFRAA >60 08/17/2015 1621    Lab Results  Component Value Date   [id number] 03/08/2015   Lab Results  Component Value Date   TSH 1.771 03/09/2015      ASSESSMENT AND PLAN 83 y.o. year old female  has a past medical history of Adenomatous colon polyp; Dementia; Depression with anxiety; DUB (dysfunctional uterine bleeding); Elevated cholesterol; Hyperlipidemia; Hypertension; Kidney stone; Sleep apnea; and Thyroid disease. here with:  1. Dementia  2. Gait abnormality  The patient's memory score has slightly declined. She will continue on Razadyne. The patient was able to ambulate for me today. I will refer the patient for physical therapy in the home. The caregiver (daughter) may also need some instruction on how to assist her mom with ambulation. Patient and her daughter advised that if her symptoms worsen or she develops new symptoms she should let us know. Will follow-up in 6 months or sooner if needed.   Ward Givens, MSN, NP-C 03/11/2016, 11:05 AM [facility] [address], [place], [place] [address] [phone]

## 2016-03-13 DIAGNOSIS — K219 Gastro-esophageal reflux disease without esophagitis: Secondary | ICD-10-CM | POA: Diagnosis not present

## 2016-03-13 DIAGNOSIS — F0280 Dementia in other diseases classified elsewhere without behavioral disturbance: Secondary | ICD-10-CM | POA: Diagnosis not present

## 2016-03-13 DIAGNOSIS — G309 Alzheimer's disease, unspecified: Secondary | ICD-10-CM | POA: Diagnosis not present

## 2016-03-13 DIAGNOSIS — R2689 Other abnormalities of gait and mobility: Secondary | ICD-10-CM | POA: Diagnosis not present

## 2016-03-13 DIAGNOSIS — G4730 Sleep apnea, unspecified: Secondary | ICD-10-CM | POA: Diagnosis not present

## 2016-03-13 DIAGNOSIS — F329 Major depressive disorder, single episode, unspecified: Secondary | ICD-10-CM | POA: Diagnosis not present

## 2016-03-13 DIAGNOSIS — D509 Iron deficiency anemia, unspecified: Secondary | ICD-10-CM | POA: Diagnosis not present

## 2016-03-13 DIAGNOSIS — E785 Hyperlipidemia, unspecified: Secondary | ICD-10-CM | POA: Diagnosis not present

## 2016-03-13 DIAGNOSIS — Z9181 History of falling: Secondary | ICD-10-CM | POA: Diagnosis not present

## 2016-03-13 DIAGNOSIS — F419 Anxiety disorder, unspecified: Secondary | ICD-10-CM | POA: Diagnosis not present

## 2016-03-13 DIAGNOSIS — I129 Hypertensive chronic kidney disease with stage 1 through stage 4 chronic kidney disease, or unspecified chronic kidney disease: Secondary | ICD-10-CM | POA: Diagnosis not present

## 2016-03-13 DIAGNOSIS — E039 Hypothyroidism, unspecified: Secondary | ICD-10-CM | POA: Diagnosis not present

## 2016-03-13 DIAGNOSIS — N189 Chronic kidney disease, unspecified: Secondary | ICD-10-CM | POA: Diagnosis not present

## 2016-03-14 ENCOUNTER — Telehealth: Payer: Self-pay | Admitting: Adult Health

## 2016-03-14 NOTE — Addendum Note (Signed)
Addended byOliver Hum on: 03/14/2016 03:53 PM   Modules accepted: Orders

## 2016-03-14 NOTE — Telephone Encounter (Signed)
Order for SW started and ok for PT 3x wk for 2 wks.

## 2016-03-14 NOTE — Addendum Note (Signed)
Addended byOliver Hum on: 03/14/2016 04:27 PM   Modules accepted: Orders

## 2016-03-14 NOTE — Telephone Encounter (Signed)
I spoke to [place], and relayed that yes SW referral is ok as well as PT recommendations. She verbalized understanding, no need for additional order will forward FL-2 to [place].

## 2016-03-14 NOTE — Telephone Encounter (Signed)
Brianna [name] a physical therapist with [facility] is calling to get an order for a social worker to visit the patient. She says patient is a poor rehab patient and her husband really wants to have her placed in a memory care facility.[name] also needs an order for PT 3 times a week for 2 weeks.

## 2016-03-14 NOTE — Telephone Encounter (Signed)
Noted! Thank you

## 2016-03-15 DIAGNOSIS — D509 Iron deficiency anemia, unspecified: Secondary | ICD-10-CM | POA: Diagnosis not present

## 2016-03-15 DIAGNOSIS — G309 Alzheimer's disease, unspecified: Secondary | ICD-10-CM | POA: Diagnosis not present

## 2016-03-15 DIAGNOSIS — N390 Urinary tract infection, site not specified: Secondary | ICD-10-CM | POA: Diagnosis not present

## 2016-03-15 DIAGNOSIS — I129 Hypertensive chronic kidney disease with stage 1 through stage 4 chronic kidney disease, or unspecified chronic kidney disease: Secondary | ICD-10-CM | POA: Diagnosis not present

## 2016-03-15 DIAGNOSIS — F0280 Dementia in other diseases classified elsewhere without behavioral disturbance: Secondary | ICD-10-CM | POA: Diagnosis not present

## 2016-03-15 DIAGNOSIS — R2689 Other abnormalities of gait and mobility: Secondary | ICD-10-CM | POA: Diagnosis not present

## 2016-03-15 DIAGNOSIS — N189 Chronic kidney disease, unspecified: Secondary | ICD-10-CM | POA: Diagnosis not present

## 2016-03-18 DIAGNOSIS — D509 Iron deficiency anemia, unspecified: Secondary | ICD-10-CM | POA: Diagnosis not present

## 2016-03-18 DIAGNOSIS — G309 Alzheimer's disease, unspecified: Secondary | ICD-10-CM | POA: Diagnosis not present

## 2016-03-18 DIAGNOSIS — I129 Hypertensive chronic kidney disease with stage 1 through stage 4 chronic kidney disease, or unspecified chronic kidney disease: Secondary | ICD-10-CM | POA: Diagnosis not present

## 2016-03-18 DIAGNOSIS — N189 Chronic kidney disease, unspecified: Secondary | ICD-10-CM | POA: Diagnosis not present

## 2016-03-18 DIAGNOSIS — F0280 Dementia in other diseases classified elsewhere without behavioral disturbance: Secondary | ICD-10-CM | POA: Diagnosis not present

## 2016-03-18 DIAGNOSIS — R2689 Other abnormalities of gait and mobility: Secondary | ICD-10-CM | POA: Diagnosis not present

## 2016-03-19 DIAGNOSIS — D509 Iron deficiency anemia, unspecified: Secondary | ICD-10-CM | POA: Diagnosis not present

## 2016-03-19 DIAGNOSIS — F0280 Dementia in other diseases classified elsewhere without behavioral disturbance: Secondary | ICD-10-CM | POA: Diagnosis not present

## 2016-03-19 DIAGNOSIS — R2689 Other abnormalities of gait and mobility: Secondary | ICD-10-CM | POA: Diagnosis not present

## 2016-03-19 DIAGNOSIS — G309 Alzheimer's disease, unspecified: Secondary | ICD-10-CM | POA: Diagnosis not present

## 2016-03-19 DIAGNOSIS — N189 Chronic kidney disease, unspecified: Secondary | ICD-10-CM | POA: Diagnosis not present

## 2016-03-19 DIAGNOSIS — I129 Hypertensive chronic kidney disease with stage 1 through stage 4 chronic kidney disease, or unspecified chronic kidney disease: Secondary | ICD-10-CM | POA: Diagnosis not present

## 2016-03-20 ENCOUNTER — Telehealth: Payer: Self-pay | Admitting: *Deleted

## 2016-03-20 NOTE — Telephone Encounter (Signed)
Received UA results for pt from [facility] and [facility]. [phone].   DATED:  03/15/16    Negative results except appearance cloudy, microscopic not performed, CX shows less then 10,000 colony units of bacteria per m/ urine, not considered clinically significant.

## 2016-03-21 NOTE — Telephone Encounter (Signed)
Noted! Thank you

## 2016-03-25 ENCOUNTER — Telehealth: Payer: Self-pay | Admitting: Adult Health

## 2016-03-25 NOTE — Telephone Encounter (Addendum)
I spoke to [place], [place] for [facility].  She went out and visited the pt and husband.  They spoke about facility placement (which husband was willing to do).  Daughter is requesting to speak to [place], as she was not available at the time she came out initially.  I told her that this was ok to proceed.

## 2016-03-25 NOTE — Telephone Encounter (Signed)
Brianna [name] (Education officer, [employer]) with [facility] called requesting a verbal order for a revisit for patient due to daughter not being at the first visit.  Please call

## 2016-03-26 NOTE — Telephone Encounter (Signed)
Noted  

## 2016-03-27 DIAGNOSIS — N189 Chronic kidney disease, unspecified: Secondary | ICD-10-CM | POA: Diagnosis not present

## 2016-03-27 DIAGNOSIS — G309 Alzheimer's disease, unspecified: Secondary | ICD-10-CM | POA: Diagnosis not present

## 2016-03-27 DIAGNOSIS — R2689 Other abnormalities of gait and mobility: Secondary | ICD-10-CM | POA: Diagnosis not present

## 2016-03-27 DIAGNOSIS — F0280 Dementia in other diseases classified elsewhere without behavioral disturbance: Secondary | ICD-10-CM | POA: Diagnosis not present

## 2016-03-27 DIAGNOSIS — I129 Hypertensive chronic kidney disease with stage 1 through stage 4 chronic kidney disease, or unspecified chronic kidney disease: Secondary | ICD-10-CM | POA: Diagnosis not present

## 2016-03-27 DIAGNOSIS — D509 Iron deficiency anemia, unspecified: Secondary | ICD-10-CM | POA: Diagnosis not present

## 2016-03-29 DIAGNOSIS — G309 Alzheimer's disease, unspecified: Secondary | ICD-10-CM | POA: Diagnosis not present

## 2016-03-29 DIAGNOSIS — D509 Iron deficiency anemia, unspecified: Secondary | ICD-10-CM | POA: Diagnosis not present

## 2016-03-29 DIAGNOSIS — R2689 Other abnormalities of gait and mobility: Secondary | ICD-10-CM | POA: Diagnosis not present

## 2016-03-29 DIAGNOSIS — F0280 Dementia in other diseases classified elsewhere without behavioral disturbance: Secondary | ICD-10-CM | POA: Diagnosis not present

## 2016-03-29 DIAGNOSIS — I129 Hypertensive chronic kidney disease with stage 1 through stage 4 chronic kidney disease, or unspecified chronic kidney disease: Secondary | ICD-10-CM | POA: Diagnosis not present

## 2016-03-29 DIAGNOSIS — N189 Chronic kidney disease, unspecified: Secondary | ICD-10-CM | POA: Diagnosis not present

## 2016-03-30 DIAGNOSIS — I129 Hypertensive chronic kidney disease with stage 1 through stage 4 chronic kidney disease, or unspecified chronic kidney disease: Secondary | ICD-10-CM | POA: Diagnosis not present

## 2016-03-30 DIAGNOSIS — F0280 Dementia in other diseases classified elsewhere without behavioral disturbance: Secondary | ICD-10-CM | POA: Diagnosis not present

## 2016-03-30 DIAGNOSIS — G309 Alzheimer's disease, unspecified: Secondary | ICD-10-CM | POA: Diagnosis not present

## 2016-03-30 DIAGNOSIS — D509 Iron deficiency anemia, unspecified: Secondary | ICD-10-CM | POA: Diagnosis not present

## 2016-03-30 DIAGNOSIS — R2689 Other abnormalities of gait and mobility: Secondary | ICD-10-CM | POA: Diagnosis not present

## 2016-03-30 DIAGNOSIS — N189 Chronic kidney disease, unspecified: Secondary | ICD-10-CM | POA: Diagnosis not present

## 2016-04-03 ENCOUNTER — Telehealth: Payer: Self-pay | Admitting: Neurology

## 2016-04-03 NOTE — Telephone Encounter (Signed)
Spoke to [facility] with [facility]. Patient and daughter are declining services for Home Physical Therapy. Family feels like she does not need at this time.

## 2016-04-09 ENCOUNTER — Ambulatory Visit: Payer: Medicare Other | Admitting: Neurology

## 2016-04-22 ENCOUNTER — Inpatient Hospital Stay (HOSPITAL_COMMUNITY)
Admission: EM | Admit: 2016-04-22 | Discharge: 2016-04-26 | DRG: 689 | Disposition: A | Discharge status: Skilled Nursing Facility | Payer: Medicare Other | Attending: Internal Medicine | Admitting: Internal Medicine

## 2016-04-22 ENCOUNTER — Emergency Department (HOSPITAL_COMMUNITY): Payer: Medicare Other

## 2016-04-22 ENCOUNTER — Encounter (HOSPITAL_COMMUNITY): Payer: Self-pay | Admitting: Emergency Medicine

## 2016-04-22 DIAGNOSIS — E860 Dehydration: Secondary | ICD-10-CM | POA: Diagnosis not present

## 2016-04-22 DIAGNOSIS — I10 Essential (primary) hypertension: Secondary | ICD-10-CM | POA: Diagnosis present

## 2016-04-22 DIAGNOSIS — F418 Other specified anxiety disorders: Secondary | ICD-10-CM | POA: Diagnosis present

## 2016-04-22 DIAGNOSIS — F0280 Dementia in other diseases classified elsewhere without behavioral disturbance: Secondary | ICD-10-CM | POA: Diagnosis not present

## 2016-04-22 DIAGNOSIS — R0602 Shortness of breath: Secondary | ICD-10-CM | POA: Diagnosis not present

## 2016-04-22 DIAGNOSIS — R410 Disorientation, unspecified: Secondary | ICD-10-CM | POA: Diagnosis present

## 2016-04-22 DIAGNOSIS — R4182 Altered mental status, unspecified: Secondary | ICD-10-CM | POA: Diagnosis not present

## 2016-04-22 DIAGNOSIS — R030 Elevated blood-pressure reading, without diagnosis of hypertension: Secondary | ICD-10-CM | POA: Diagnosis not present

## 2016-04-22 DIAGNOSIS — E538 Deficiency of other specified B group vitamins: Secondary | ICD-10-CM | POA: Diagnosis present

## 2016-04-22 DIAGNOSIS — E038 Other specified hypothyroidism: Secondary | ICD-10-CM | POA: Diagnosis not present

## 2016-04-22 DIAGNOSIS — F0281 Dementia in other diseases classified elsewhere with behavioral disturbance: Secondary | ICD-10-CM | POA: Diagnosis present

## 2016-04-22 DIAGNOSIS — G309 Alzheimer's disease, unspecified: Secondary | ICD-10-CM

## 2016-04-22 DIAGNOSIS — Z87442 Personal history of urinary calculi: Secondary | ICD-10-CM

## 2016-04-22 DIAGNOSIS — Z90710 Acquired absence of both cervix and uterus: Secondary | ICD-10-CM

## 2016-04-22 DIAGNOSIS — F02818 Dementia in other diseases classified elsewhere, unspecified severity, with other behavioral disturbance: Secondary | ICD-10-CM | POA: Diagnosis present

## 2016-04-22 DIAGNOSIS — E039 Hypothyroidism, unspecified: Secondary | ICD-10-CM | POA: Diagnosis present

## 2016-04-22 DIAGNOSIS — N390 Urinary tract infection, site not specified: Principal | ICD-10-CM | POA: Diagnosis present

## 2016-04-22 DIAGNOSIS — E876 Hypokalemia: Secondary | ICD-10-CM | POA: Diagnosis not present

## 2016-04-22 DIAGNOSIS — G9340 Encephalopathy, unspecified: Secondary | ICD-10-CM | POA: Diagnosis not present

## 2016-04-22 DIAGNOSIS — D509 Iron deficiency anemia, unspecified: Secondary | ICD-10-CM | POA: Diagnosis not present

## 2016-04-22 DIAGNOSIS — G9341 Metabolic encephalopathy: Secondary | ICD-10-CM | POA: Diagnosis not present

## 2016-04-22 DIAGNOSIS — Z8249 Family history of ischemic heart disease and other diseases of the circulatory system: Secondary | ICD-10-CM

## 2016-04-22 DIAGNOSIS — Z880 Allergy status to penicillin: Secondary | ICD-10-CM

## 2016-04-22 DIAGNOSIS — R404 Transient alteration of awareness: Secondary | ICD-10-CM | POA: Diagnosis not present

## 2016-04-22 LAB — URINALYSIS, ROUTINE W REFLEX MICROSCOPIC
BILIRUBIN URINE: NEGATIVE
GLUCOSE, UA: NEGATIVE mg/dL
KETONES UR: NEGATIVE mg/dL
LEUKOCYTES UA: NEGATIVE
NITRITE: NEGATIVE
PH: 5 (ref 5.0–8.0)
Protein, ur: NEGATIVE mg/dL
SPECIFIC GRAVITY, URINE: 1.004 — AB (ref 1.005–1.030)
Squamous Epithelial / LPF: NONE SEEN

## 2016-04-22 LAB — COMPREHENSIVE METABOLIC PANEL
ALK PHOS: 74 U/L (ref 38–126)
ALT: 10 U/L — AB (ref 14–54)
AST: 19 U/L (ref 15–41)
Albumin: 4.1 g/dL (ref 3.5–5.0)
Anion gap: 8 (ref 5–15)
BILIRUBIN TOTAL: 0.3 mg/dL (ref 0.3–1.2)
BUN: 16 mg/dL (ref 6–20)
CALCIUM: 9.8 mg/dL (ref 8.9–10.3)
CHLORIDE: 104 mmol/L (ref 101–111)
CO2: 22 mmol/L (ref 22–32)
Creatinine, Ser: 0.82 mg/dL (ref 0.44–1.00)
Glucose, Bld: 112 mg/dL — ABNORMAL HIGH (ref 65–99)
Potassium: 3.5 mmol/L (ref 3.5–5.1)
Sodium: 134 mmol/L — ABNORMAL LOW (ref 135–145)
TOTAL PROTEIN: 7.8 g/dL (ref 6.5–8.1)

## 2016-04-22 LAB — CBC
HCT: 36.8 % (ref 36.0–46.0)
Hemoglobin: 12.2 g/dL (ref 12.0–15.0)
MCH: 26.8 pg (ref 26.0–34.0)
MCHC: 33.2 g/dL (ref 30.0–36.0)
MCV: 80.7 fL (ref 78.0–100.0)
PLATELETS: 256 10*3/uL (ref 150–400)
RBC: 4.56 MIL/uL (ref 3.87–5.11)
RDW: 14.5 % (ref 11.5–15.5)
WBC: 8.7 10*3/uL (ref 4.0–10.5)

## 2016-04-22 LAB — HEPATIC FUNCTION PANEL
ALT: 10 U/L (ref 7–35)
AST: 19 U/L (ref 13–35)
Alkaline Phosphatase: 74 U/L (ref 25–125)
BILIRUBIN, TOTAL: 0.3 mg/dL

## 2016-04-22 LAB — CBC AND DIFFERENTIAL
HEMATOCRIT: 37 % (ref 36–46)
Hemoglobin: 12.2 g/dL (ref 12.0–16.0)
PLATELETS: 256 10*3/uL (ref 150–399)
WBC: 8.7 10^3/mL

## 2016-04-22 LAB — BASIC METABOLIC PANEL
BUN: 16 mg/dL (ref 4–21)
Creatinine: 0.8 mg/dL (ref 0.5–1.1)
GLUCOSE: 112 mg/dL
Potassium: 3.5 mmol/L (ref 3.4–5.3)
Sodium: 134 mmol/L — AB (ref 137–147)

## 2016-04-22 LAB — I-STAT CG4 LACTIC ACID, ED: LACTIC ACID, VENOUS: 0.97 mmol/L (ref 0.5–1.9)

## 2016-04-22 LAB — TSH: TSH: 3.075 u[IU]/mL (ref 0.350–4.500)

## 2016-04-22 LAB — VITAMIN B12: Vitamin B-12: 243 pg/mL (ref 180–914)

## 2016-04-22 MED ORDER — LEVOTHYROXINE SODIUM 100 MCG PO TABS
100.0000 ug | ORAL_TABLET | Freq: Every day | ORAL | Status: DC
  Administered 2016-04-23 – 2016-04-26 (×4): 100 ug via ORAL
  Filled 2016-04-22 (×4): qty 1

## 2016-04-22 MED ORDER — POLYSACCHARIDE IRON COMPLEX 150 MG PO CAPS
150.0000 mg | ORAL_CAPSULE | Freq: Every day | ORAL | Status: DC
  Administered 2016-04-23: 150 mg via ORAL
  Filled 2016-04-22 (×3): qty 1

## 2016-04-22 MED ORDER — ENOXAPARIN SODIUM 40 MG/0.4ML ~~LOC~~ SOLN
40.0000 mg | SUBCUTANEOUS | Status: DC
  Administered 2016-04-22 – 2016-04-25 (×4): 40 mg via SUBCUTANEOUS
  Filled 2016-04-22 (×5): qty 0.4

## 2016-04-22 MED ORDER — ONDANSETRON HCL 4 MG PO TABS
4.0000 mg | ORAL_TABLET | Freq: Four times a day (QID) | ORAL | Status: DC | PRN
  Administered 2016-04-26: 4 mg via ORAL
  Filled 2016-04-22: qty 1

## 2016-04-22 MED ORDER — POLYSACCHARIDE IRON COMPLEX 150 MG PO CAPS: 150.0000 mg | ORAL_CAPSULE | Freq: Three times a day (TID) | ORAL | Status: DC

## 2016-04-22 MED ORDER — POLYSACCHARIDE IRON COMPLEX 150 MG PO CAPS
150.0000 mg | ORAL_CAPSULE | Freq: Every day | ORAL | Status: DC
  Administered 2016-04-23 – 2016-04-26 (×4): 150 mg via ORAL
  Filled 2016-04-22 (×4): qty 1

## 2016-04-22 MED ORDER — ACETAMINOPHEN 325 MG PO TABS
650.0000 mg | ORAL_TABLET | Freq: Four times a day (QID) | ORAL | Status: DC | PRN
  Filled 2016-04-22: qty 2

## 2016-04-22 MED ORDER — ACETAMINOPHEN 650 MG RE SUPP: 650.0000 mg | Freq: Four times a day (QID) | RECTAL | Status: DC | PRN

## 2016-04-22 MED ORDER — NISOLDIPINE ER 17 MG PO TB24
17.0000 mg | ORAL_TABLET | Freq: Every day | ORAL | Status: DC
  Administered 2016-04-23 – 2016-04-26 (×4): 17 mg via ORAL
  Filled 2016-04-22 (×5): qty 1

## 2016-04-22 MED ORDER — GALANTAMINE HYDROBROMIDE ER 8 MG PO CP24
16.0000 mg | ORAL_CAPSULE | Freq: Every day | ORAL | Status: DC
  Filled 2016-04-22: qty 2

## 2016-04-22 MED ORDER — TRAZODONE HCL 50 MG PO TABS
25.0000 mg | ORAL_TABLET | Freq: Every day | ORAL | Status: DC
  Administered 2016-04-22 – 2016-04-23 (×2): 25 mg via ORAL
  Filled 2016-04-22 (×2): qty 1

## 2016-04-22 MED ORDER — POLYSACCHARIDE IRON COMPLEX 150 MG PO CAPS
300.0000 mg | ORAL_CAPSULE | Freq: Two times a day (BID) | ORAL | Status: DC
  Administered 2016-04-23 – 2016-04-25 (×5): 300 mg via ORAL
  Filled 2016-04-22 (×9): qty 2

## 2016-04-22 MED ORDER — GALANTAMINE HYDROBROMIDE 4 MG PO TABS
8.0000 mg | ORAL_TABLET | Freq: Two times a day (BID) | ORAL | Status: DC
  Administered 2016-04-23 – 2016-04-26 (×7): 8 mg via ORAL
  Filled 2016-04-22 (×8): qty 2

## 2016-04-22 MED ORDER — IRBESARTAN 300 MG PO TABS
300.0000 mg | ORAL_TABLET | Freq: Every day | ORAL | Status: DC
  Administered 2016-04-23 – 2016-04-26 (×4): 300 mg via ORAL
  Filled 2016-04-22: qty 2
  Filled 2016-04-22 (×2): qty 1
  Filled 2016-04-22: qty 2
  Filled 2016-04-22 (×2): qty 1
  Filled 2016-04-22 (×2): qty 2

## 2016-04-22 MED ORDER — ONDANSETRON HCL 4 MG/2ML IJ SOLN
4.0000 mg | Freq: Four times a day (QID) | INTRAMUSCULAR | Status: DC | PRN
  Administered 2016-04-24 – 2016-04-25 (×2): 4 mg via INTRAVENOUS
  Filled 2016-04-22 (×2): qty 2

## 2016-04-22 MED ORDER — VITAMIN D (ERGOCALCIFEROL) 1.25 MG (50000 UNIT) PO CAPS
50000.0000 [IU] | ORAL_CAPSULE | ORAL | Status: DC
  Administered 2016-04-23: 50000 [IU] via ORAL
  Filled 2016-04-22: qty 1

## 2016-04-22 MED ORDER — PANTOPRAZOLE SODIUM 40 MG PO TBEC
80.0000 mg | DELAYED_RELEASE_TABLET | Freq: Every day | ORAL | Status: DC
  Administered 2016-04-22 – 2016-04-26 (×5): 80 mg via ORAL
  Filled 2016-04-22 (×7): qty 2

## 2016-04-22 NOTE — ED Notes (Signed)
Bed: YQ03 Expected date: 04/22/16 Expected time:  Means of arrival:  Comments: [facility]

## 2016-04-22 NOTE — ED Provider Notes (Signed)
[facility] DEPT Provider Note   CSN: [id number] Arrival date & time: 04/22/16  9381     History   Chief Complaint Chief Complaint  Patient presents with  . Altered Mental Status    HPI Brianna [name] is a 83 y.o. female.  83 year old female presents from home with altered mental status 24 hours. Patient has history of same associated with UTIs. Patient became acutely agitated and had family members. Family states that her baseline mental functioning is that she can carry conversation. Has had some forgetfulness. EMS was called and patient required Haldol for sedation. Patient's history is limited due to her current state. There has been no reported history of fever, cough, congestion. No reported history of vomiting or diarrhea She does also have a baseline history of dementia. No recent history of falls. EMS was called and patient transported here      Past Medical History:  Diagnosis Date  . Adenomatous colon polyp   . Dementia   . Depression with anxiety   . DUB (dysfunctional uterine bleeding)   . Elevated cholesterol   . Hyperlipidemia   . Hypertension   . Kidney stone   . Sleep apnea   . Thyroid disease    Hypothyroid    Patient Active Problem List   Diagnosis Date Noted  . Leukocytosis 03/19/2015  . AKI (acute kidney injury) ([place]) 03/14/2015  . GERD (gastroesophageal reflux disease) 03/14/2015  . Diaper dermatitis 03/14/2015  . Syncope 03/08/2015  . Physical deconditioning 03/08/2015  . Alzheimer's dementia without behavioral disturbance 10/11/2014  . Iron deficiency anemia 10/26/2013  . Diarrhea 10/26/2013  . Near syncope 09/06/2013  . Hypothyroid 09/06/2013  . Hyponatremia 02/13/2013  . Weakness 02/13/2013  . 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 Hypersomnia with sleep apnea, unspecified 11/17/2012  . DUB (dysfunctional uterine bleeding)   . Hypertension   . Dementia   . Elevated cholesterol     . Kidney stone     Past Surgical History:  Procedure Laterality Date  . ABDOMINAL HYSTERECTOMY    . APPENDECTOMY    . CHOLECYSTECTOMY    . Colon tumor-benign    . kidney stones      OB History    Gravida Para Term Preterm AB Living   3 3 3     3    SAB TAB Ectopic Multiple Live Births                   Home Medications    Prior to Admission medications   Medication Sig Start Date End Date Taking? Authorizing Provider  esomeprazole (NEXIUM) 40 MG capsule Take 40 mg by mouth daily.     Historical Provider, MD  galantamine (RAZADYNE ER) 16 MG 24 hr capsule TAKE ONE CAPSULE BY MOUTH EVERY DAY WITH BREAKFAST 10/06/15   Dennie Bible, NP  iron polysaccharides (NU-IRON) 150 MG capsule Take 150-300 mg by mouth 3 (three) times daily. Take 1 capsule in the morning Take 2 capsules at lunch & dinner 06/02/12   Historical Provider, MD  levothyroxine (SYNTHROID, LEVOTHROID) 100 MCG tablet Take 100 mcg by mouth daily. 07/30/15   Historical Provider, MD  nisoldipine (SULAR) 17 MG 24 hr tablet Take 17 mg by mouth daily.  03/13/09   Historical Provider, MD  PRISTIQ 50 MG 24 hr tablet Take 50 mg by mouth daily.  04/05/13   Historical Provider, MD  traZODone (DESYREL) 50 MG tablet Take 25 mg by mouth at bedtime.  02/16/13   Historical Provider, MD  valsartan (DIOVAN) 320 MG tablet Take 320 mg by mouth daily.    Historical Provider, MD  Vitamin D, Ergocalciferol, (DRISDOL) 50000 UNITS CAPS capsule Take 50,000 Units by mouth every 7 (seven) days. Tuesday    Historical Provider, MD    Family History Family History  Problem Relation Age of Onset  . Diabetes Mother   . Heart disease Father   . Hypertension Brother   . Heart disease Brother   . Diabetes type II Brother   . Diabetes type II Sister   . Thyroid disease Child   . Diabetes Child     Social History Social History  Substance Use Topics  . Smoking status: Never Smoker  . Smokeless tobacco: Never Used  . Alcohol use No      Allergies   Penicillins   Review of Systems Review of Systems  Unable to perform ROS: Dementia     Physical Exam Updated Vital Signs BP (!) 152/70   Pulse 94   Temp 99.5 F (37.5 C) (Rectal)   Resp 16   SpO2 96%   Physical Exam  Constitutional: She appears well-developed and well-nourished.  Non-toxic appearance. No distress.  HENT:  Head: Normocephalic and atraumatic.  Eyes: Conjunctivae, EOM and lids are normal. Pupils are equal, round, and reactive to light.  Neck: Normal range of motion. Neck supple. No tracheal deviation present. No thyroid mass present.  Cardiovascular: Normal rate, regular rhythm and normal heart sounds.  Exam reveals no gallop.   No murmur heard. Pulmonary/Chest: Effort normal and breath sounds normal. No stridor. No respiratory distress. She has no decreased breath sounds. She has no wheezes. She has no rhonchi. She has no rales.  Abdominal: Soft. Normal appearance and bowel sounds are normal. She exhibits no distension. There is no tenderness. There is no rebound and no CVA tenderness.  Musculoskeletal: Normal range of motion. She exhibits no edema or tenderness.  Neurological: She is alert. She has normal strength. She is disoriented. No cranial nerve deficit or sensory deficit. GCS eye subscore is 4. GCS verbal subscore is 5. GCS motor subscore is 6.  Skin: Skin is warm and dry. No abrasion and no rash noted.  Psychiatric: Her affect is blunt. Her speech is delayed. She is withdrawn.  Nursing note and vitals reviewed.    ED Treatments / Results  Labs (all labs ordered are listed, but only abnormal results are displayed) Labs Reviewed  URINE CULTURE  CULTURE, BLOOD (ROUTINE X 2)  CULTURE, BLOOD (ROUTINE X 2)  COMPREHENSIVE METABOLIC PANEL  CBC  URINALYSIS, ROUTINE W REFLEX MICROSCOPIC  I-STAT CG4 LACTIC ACID, ED    EKG  EKG Interpretation None       Radiology No results found.  Procedures Procedures (including critical  care time)  Medications Ordered in ED Medications - No data to display   Initial Impression / Assessment and Plan / ED Course  I have reviewed the triage vital signs and the nursing notes.  Pertinent labs & imaging results that were available during my care of the patient were reviewed by me and considered in my medical decision making (see chart for details).     Pt here with ams Suspect acute delirium Will admit  Final Clinical Impressions(s) / ED Diagnoses   Final diagnoses:  None    New Prescriptions New Prescriptions   No medications on file     Lacretia Leigh, MD 04/23/16 1208

## 2016-04-22 NOTE — Progress Notes (Signed)
Pt had IV placed in the ED in the R forearm. Pt has removed IV site from arm.

## 2016-04-22 NOTE — H&P (Signed)
History and Physical    Brianna [name] BDZ:[id number] DOB: 1932-08-30 DOA: 04/22/2016  I have briefly reviewed the patient's prior medical records in [place]  PCP: Brianna Stack, MD  Patient coming from: Home  Chief Complaint: Acute delirium  HPI: Brianna [name] is a 83 y.o. female with medical history significant of hypertension, hyperlipidemia, underlying dementia, presents to the emergency room brought by her daughter from home due to sudden onset of delirium and agitation last night.  Patient has underlying dementia, however has no behavioral disturbances and is generally quite calm and cooperative. Daughter states that this happens usually when she gets an infection. Patient cannot contribute to the story   ED Course: In the ED patient has a low-grade temp of 99.5, otherwise vitals are stable, lactic acid is normal blood work is essentially unremarkable.  Urinalysis shows rare bacteria and not very clear for infection.  TRH is asked for admission for acute delirium without apparent cause  Review of Systems: As per HPI otherwise 10 point review of systems negative.   Past Medical History:  Diagnosis Date  . Adenomatous colon polyp   . Dementia   . Depression with anxiety   . DUB (dysfunctional uterine bleeding)   . Elevated cholesterol   . Hyperlipidemia   . Hypertension   . Kidney stone   . Sleep apnea   . Thyroid disease    Hypothyroid   Past Surgical History:  Procedure Laterality Date  . ABDOMINAL HYSTERECTOMY    . APPENDECTOMY    . CHOLECYSTECTOMY    . Colon tumor-benign    . kidney stones      reports that she has never smoked. She has never used smokeless tobacco. She reports that she does not drink alcohol or use drugs.  Allergies  Allergen Reactions  . Penicillins Other (See Comments)       Family History  Problem Relation Age of Onset  . Diabetes Mother   . Heart disease Father   . Hypertension Brother   . Heart disease Brother   .  Diabetes type II Brother   . Diabetes type II Sister   . Thyroid disease Child   . Diabetes Child    Prior to Admission medications   Medication Sig Start Date End Date Taking? Authorizing Provider  esomeprazole (NEXIUM) 40 MG capsule Take 40 mg by mouth daily.     Historical Provider, MD  galantamine (RAZADYNE ER) 16 MG 24 hr capsule TAKE ONE CAPSULE BY MOUTH EVERY DAY WITH BREAKFAST 10/06/15   Dennie Bible, NP  iron polysaccharides (NU-IRON) 150 MG capsule Take 150-300 mg by mouth 3 (three) times daily. Take 1 capsule in the morning Take 2 capsules at lunch & dinner 06/02/12   Historical Provider, MD  levothyroxine (SYNTHROID, LEVOTHROID) 100 MCG tablet Take 100 mcg by mouth daily. 07/30/15   Historical Provider, MD  nisoldipine (SULAR) 17 MG 24 hr tablet Take 17 mg by mouth daily.  03/13/09   Historical Provider, MD  PRISTIQ 50 MG 24 hr tablet Take 50 mg by mouth daily.  04/05/13   Historical Provider, MD  traZODone (DESYREL) 50 MG tablet Take 25 mg by mouth at bedtime.  02/16/13   Historical Provider, MD  valsartan (DIOVAN) 320 MG tablet Take 320 mg by mouth daily.    Historical Provider, MD  Vitamin D, Ergocalciferol, (DRISDOL) 50000 UNITS CAPS capsule Take 50,000 Units by mouth every 7 (seven) days. Tuesday    Historical Provider, MD  Physical Exam: Vitals:   04/22/16 1000 04/22/16 1045 04/22/16 1130 04/22/16 1215  BP:      Pulse: 89 86 (!) 102 100  Resp: (!) 22 (!) 21 17 15   Temp:      TempSrc:      SpO2: 97% 99% 98% 100%   Constitutional: NAD, calm, comfortable, confused Vitals:   04/22/16 1000 04/22/16 1045 04/22/16 1130 04/22/16 1215  BP:      Pulse: 89 86 (!) 102 100  Resp: (!) 22 (!) 21 17 15   Temp:      TempSrc:      SpO2: 97% 99% 98% 100%   Eyes: PERRL, lids and conjunctivae normal ENMT: Mucous membranes are moist. Posterior pharynx clear of any exudate or lesions..  Neck: normal, supple, no masses, no thyromegaly Respiratory: clear to auscultation bilaterally,  no wheezing, no crackles. Normal respiratory effort. Cardiovascular: Regular rate and rhythm, no murmurs / rubs / gallops. No extremity edema.  Abdomen: no tenderness, no masses palpated. Bowel sounds positive.  Musculoskeletal: no clubbing / cyanosis. Normal muscle tone.  Skin: no rashes, lesions, ulcers. No induration Neurologic: CN 2-12 grossly intact. Strength 5/5 in all 4.  Psychiatric: Normal judgment and insight. Alert and oriented x to person only.   Labs on Admission: I have personally reviewed following labs and imaging studies  CBC:  Recent Labs Lab 04/22/16 1021  WBC 8.7  HGB 12.2  HCT 36.8  MCV 80.7  PLT 947   Basic Metabolic Panel:  Recent Labs Lab 04/22/16 1021  NA 134*  K 3.5  CL 104  CO2 22  GLUCOSE 112*  BUN 16  CREATININE 0.82  CALCIUM 9.8   Liver Function Tests:  Recent Labs Lab 04/22/16 1021  AST 19  ALT 10*  ALKPHOS 74  BILITOT 0.3  PROT 7.8  ALBUMIN 4.1   Urine analysis:    Component Value Date/Time   COLORURINE STRAW (A) 04/22/2016 1013   APPEARANCEUR CLEAR 04/22/2016 1013   LABSPEC 1.004 (L) 04/22/2016 1013   PHURINE 5.0 04/22/2016 1013   GLUCOSEU NEGATIVE 04/22/2016 1013   HGBUR SMALL (A) 04/22/2016 1013   BILIRUBINUR NEGATIVE 04/22/2016 1013   KETONESUR NEGATIVE 04/22/2016 1013   PROTEINUR NEGATIVE 04/22/2016 1013   UROBILINOGEN 0.2 02/04/2014 1509   NITRITE NEGATIVE 04/22/2016 [place] 04/22/2016 1013   Radiological Exams on Admission: Ct Head Wo Contrast  Result Date: 04/22/2016 CLINICAL DATA:  Altered mental status. EXAM: CT HEAD WITHOUT CONTRAST TECHNIQUE: Contiguous axial images were obtained from the base of the skull through the vertex without intravenous contrast. COMPARISON:  08/17/2015 FINDINGS: Brain: There is atrophy and chronic small vessel disease changes. No acute intracranial abnormality. Specifically, no hemorrhage, hydrocephalus, mass lesion, acute infarction, or significant intracranial  injury. Vascular: No hyperdense vessel or unexpected calcification. Skull: No acute calvarial abnormality. Sinuses/Orbits: Frothy material within the left sphenoid sinus. Paranasal sinuses and mastoids otherwise clear. Orbital soft tissues unremarkable. Other:  None IMPRESSION: No acute intracranial abnormality. Atrophy, chronic microvascular disease. Electronically Signed   By: Rolm Baptise M.D.   On: 04/22/2016 12:14   Dg Chest Port 1 View  Result Date: 04/22/2016 CLINICAL DATA:  Altered mental status.  Shortness of breath today. EXAM: PORTABLE CHEST 1 VIEW COMPARISON:  PA and lateral chest 03/08/2015. FINDINGS: Lungs are clear. Heart size is normal. No pneumothorax or pleural effusion. No focal bony abnormality. IMPRESSION: Negative chest. Electronically Signed   By: Inge Rise M.D.   On: 04/22/2016 10:44  EKG: Independently reviewed. Sinus rhythm  Assessment/Plan Active Problems:   Hypothyroid   Iron deficiency anemia   Alzheimer's dementia without behavioral disturbance   Delirium   Acute delirium -Patient with similar symptoms as in the past when she had a urinary tract infection, however urinalysis currently does not show much except for rare bacteria.  Doubt she has a UTI, however blood cultures and urine cultures were sent, will need antibiotics if will speciate. Repeat UA in the morning, ? Early UTI -Chest x-ray does not show any pneumonia -I am not sure about etiology, will check a TSH, B12 -No new medications  Alzheimer dementia -No behavioral disturbances in the past, she normally is quite calm, continue home medications  Hypothyroidism -Check TSH as above, resume Synthroid  Iron deficiency anemia -Continue iron supplementation  Hypertension -Resume home medication    DVT prophylaxis: Lovenox Code Status: Full code Family Communication: No family at bedside Disposition Plan: Admit to MedSurg Consults called: None    Admission status: Observation   At  the point of initial evaluation, it is my clinical opinion that admission for OBSERVATION is reasonable and necessary because the patient's presenting complaints in the context of their chronic conditions represent sufficient risk of deterioration or significant morbidity to constitute reasonable grounds for close observation in the hospital setting, but that the patient may be medically stable for discharge from the hospital within 24 to 48 hours.   Marzetta Board, MD [facility] Pager [phone]  If 7PM-7AM, please contact night-coverage [email or web address] Password [email or web address]  04/22/2016, 2:13 PM

## 2016-04-23 DIAGNOSIS — N390 Urinary tract infection, site not specified: Principal | ICD-10-CM

## 2016-04-23 DIAGNOSIS — M6281 Muscle weakness (generalized): Secondary | ICD-10-CM | POA: Diagnosis not present

## 2016-04-23 DIAGNOSIS — E860 Dehydration: Secondary | ICD-10-CM | POA: Diagnosis not present

## 2016-04-23 DIAGNOSIS — F0280 Dementia in other diseases classified elsewhere without behavioral disturbance: Secondary | ICD-10-CM | POA: Diagnosis not present

## 2016-04-23 DIAGNOSIS — E876 Hypokalemia: Secondary | ICD-10-CM | POA: Diagnosis not present

## 2016-04-23 DIAGNOSIS — Z90710 Acquired absence of both cervix and uterus: Secondary | ICD-10-CM | POA: Diagnosis not present

## 2016-04-23 DIAGNOSIS — G309 Alzheimer's disease, unspecified: Secondary | ICD-10-CM | POA: Diagnosis not present

## 2016-04-23 DIAGNOSIS — R410 Disorientation, unspecified: Secondary | ICD-10-CM | POA: Diagnosis not present

## 2016-04-23 DIAGNOSIS — E039 Hypothyroidism, unspecified: Secondary | ICD-10-CM | POA: Diagnosis present

## 2016-04-23 DIAGNOSIS — Z87442 Personal history of urinary calculi: Secondary | ICD-10-CM | POA: Diagnosis not present

## 2016-04-23 DIAGNOSIS — E538 Deficiency of other specified B group vitamins: Secondary | ICD-10-CM | POA: Diagnosis present

## 2016-04-23 DIAGNOSIS — Z880 Allergy status to penicillin: Secondary | ICD-10-CM | POA: Diagnosis not present

## 2016-04-23 DIAGNOSIS — Z8249 Family history of ischemic heart disease and other diseases of the circulatory system: Secondary | ICD-10-CM | POA: Diagnosis not present

## 2016-04-23 DIAGNOSIS — F418 Other specified anxiety disorders: Secondary | ICD-10-CM | POA: Diagnosis present

## 2016-04-23 DIAGNOSIS — R4182 Altered mental status, unspecified: Secondary | ICD-10-CM | POA: Diagnosis not present

## 2016-04-23 DIAGNOSIS — I10 Essential (primary) hypertension: Secondary | ICD-10-CM | POA: Diagnosis present

## 2016-04-23 DIAGNOSIS — G9341 Metabolic encephalopathy: Secondary | ICD-10-CM | POA: Diagnosis present

## 2016-04-23 DIAGNOSIS — D509 Iron deficiency anemia, unspecified: Secondary | ICD-10-CM | POA: Diagnosis not present

## 2016-04-23 DIAGNOSIS — G9340 Encephalopathy, unspecified: Secondary | ICD-10-CM | POA: Diagnosis not present

## 2016-04-23 DIAGNOSIS — D638 Anemia in other chronic diseases classified elsewhere: Secondary | ICD-10-CM | POA: Diagnosis not present

## 2016-04-23 DIAGNOSIS — R488 Other symbolic dysfunctions: Secondary | ICD-10-CM | POA: Diagnosis not present

## 2016-04-23 LAB — COMPREHENSIVE METABOLIC PANEL
ALK PHOS: 56 U/L (ref 38–126)
ALT: 11 U/L — ABNORMAL LOW (ref 14–54)
ANION GAP: 8 (ref 5–15)
AST: 25 U/L (ref 15–41)
Albumin: 3.6 g/dL (ref 3.5–5.0)
BUN: 18 mg/dL (ref 6–20)
CALCIUM: 9.4 mg/dL (ref 8.9–10.3)
CO2: 24 mmol/L (ref 22–32)
Chloride: 107 mmol/L (ref 101–111)
Creatinine, Ser: 1.14 mg/dL — ABNORMAL HIGH (ref 0.44–1.00)
GFR calc non Af Amer: 44 mL/min — ABNORMAL LOW (ref >60)
GFR, EST AFRICAN AMERICAN: 50 mL/min — AB (ref >60)
Glucose, Bld: 86 mg/dL (ref 65–99)
Potassium: 4 mmol/L (ref 3.5–5.1)
SODIUM: 139 mmol/L (ref 135–145)
Total Bilirubin: 1.4 mg/dL — ABNORMAL HIGH (ref 0.3–1.2)
Total Protein: 6.9 g/dL (ref 6.5–8.1)

## 2016-04-23 LAB — HEPATIC FUNCTION PANEL: Bilirubin, Total: 1.4 mg/dL

## 2016-04-23 LAB — URINE CULTURE: Culture: NO GROWTH

## 2016-04-23 LAB — CBC AND DIFFERENTIAL
HCT: 38 % (ref 36–46)
HEMOGLOBIN: 12.2 g/dL (ref 12.0–16.0)
PLATELETS: 287 10*3/uL (ref 150–399)
WBC: 7.4 10*3/mL

## 2016-04-23 LAB — URINALYSIS, ROUTINE W REFLEX MICROSCOPIC
BILIRUBIN URINE: NEGATIVE
GLUCOSE, UA: NEGATIVE mg/dL
HGB URINE DIPSTICK: NEGATIVE
Ketones, ur: NEGATIVE mg/dL
NITRITE: NEGATIVE
PH: 5 (ref 5.0–8.0)
Protein, ur: NEGATIVE mg/dL
SPECIFIC GRAVITY, URINE: 1.009 (ref 1.005–1.030)

## 2016-04-23 LAB — BASIC METABOLIC PANEL
BUN: 18 mg/dL (ref 4–21)
CREATININE: 1.1 mg/dL (ref 0.5–1.1)
Glucose: 86 mg/dL
Potassium: 4 mmol/L (ref 3.4–5.3)
Sodium: 139 mmol/L (ref 137–147)

## 2016-04-23 LAB — CBC
HCT: 37.8 % (ref 36.0–46.0)
HEMOGLOBIN: 12.2 g/dL (ref 12.0–15.0)
MCH: 27 pg (ref 26.0–34.0)
MCHC: 32.3 g/dL (ref 30.0–36.0)
MCV: 83.6 fL (ref 78.0–100.0)
Platelets: 287 10*3/uL (ref 150–400)
RBC: 4.52 MIL/uL (ref 3.87–5.11)
RDW: 15.2 % (ref 11.5–15.5)
WBC: 7.4 10*3/uL (ref 4.0–10.5)

## 2016-04-23 MED ORDER — DEXTROSE 5 % IV SOLN
1.0000 g | INTRAVENOUS | Status: DC
  Administered 2016-04-23 – 2016-04-25 (×3): 1 g via INTRAVENOUS
  Filled 2016-04-23 (×4): qty 10

## 2016-04-23 MED ORDER — SODIUM CHLORIDE 0.9 % IV SOLN
INTRAVENOUS | Status: DC
  Administered 2016-04-23: 12:00:00 via INTRAVENOUS

## 2016-04-23 NOTE — Progress Notes (Signed)
Foley was ordered, however, patient voided prior to placement. MD made aware. Pt has continued to be able to void throughout the day.

## 2016-04-23 NOTE — Progress Notes (Signed)
Patient IV infiltrated, IV team had placed with ultrasound. MD paged. Will leave IV out for now as pt did receive ~500cc hydration today. Pt eating and drinking. Mental status much improved from this morning. Will continue to monitor.

## 2016-04-23 NOTE — Progress Notes (Signed)
MD paged regarding no urine output overnight. Pt up to [facility] x2 with no results. Pt currently has no IV. Failed two attempts, will notify IV team. Daughter at bedside.

## 2016-04-23 NOTE — Progress Notes (Signed)
PROGRESS NOTE [facility]   Brianna [name]   PQD:[id number] DOB: 1933-01-11  DOA: 04/22/2016 PCP: Sheela Stack, MD   Brief Narrative:  83 year old female with medical history significant for hypertension, hyperlipidemia underlying dementia presented to the emergency department due to sudden onset of delirium and agitation. Patient was combative to EMS and police officers for which she was sedated in order to be in the ambulance. Patient was admitted for delirium and dehydration.   Subjective: Patient seen and examined with daughter at bedside. Patient interactive and alert. She is having lunch and tolerating diet well. Daughter reported that she's not close to baseline, as patient normally can recognize her. Patient oriented to person only.  Assessment & Plan: Acute delirium - likely due to dehydration and UTI Initial UA negative, was repeated this morning since patient had similar presentation in the past with UTI's  Repeated UA grossly abnormal, since patient have symptoms of delirium will start abx - Rocephin IV Patient received IVF, line infiltrated difficult access, patient drinking well will hold IVF for now  Chest x-ray does not show any pneumonia B12 and TSH normal   Alzheimer dementia No behavioral disturbances in the past, she normally is quite calm, continue home medications  Hypothyroidism TSH normal, continue Synthroid  Iron deficiency anemia Continue iron supplementation  Hypertension Continue home medication   DVT prophylaxis: Lovenox  Code Status: FULL  Family Communication: Daughter at bedside Disposition Plan: If close to baseline mentally, can d/c in AM with oral abx for UTI   Consultants:   None  Procedures:   None   Antimicrobials:  Rocephin 04/23/16   Objective: Vitals:   04/22/16 1519 04/22/16 2117 04/23/16 0557 04/23/16 1200  BP: (!) 148/89 (!) 151/88 (!) 141/62 (!) 149/67  Pulse: 83 80 66 82  Resp: 19 20  17   Temp:  97.9 F (36.6 C) 98.3 F (36.8 C) 97.8 F (36.6 C) 97.5 F (36.4 C)  TempSrc: Oral Oral Oral Axillary  SpO2: 100% 100% 97% 100%    Intake/Output Summary (Last 24 hours) at 04/23/16 1859 Last data filed at 04/23/16 1700  Gross per 24 hour  Intake            607.5 ml  Output             1972 ml  Net          -1364.5 ml   There were no vitals filed for this visit.  Examination:  General exam: Pleasantly confuse  HEENT: OP moist  Respiratory system: Clear to auscultation. No wheezes,crackle or rhonchi Cardiovascular system: S1 & S2 heard, RRR. No JVD, murmurs, rubs or gallops Gastrointestinal system: Abdomen is nondistended, soft and nontender. Central nervous system: Alert, oriented to person only  Extremities: No pedal edema.    Skin: No rashes, lesions or ulcers  Data Reviewed: I have personally reviewed following labs and imaging studies  CBC:  Recent Labs Lab 04/22/16 1021 04/23/16 0554  WBC 8.7 7.4  HGB 12.2 12.2  HCT 36.8 37.8  MCV 80.7 83.6  PLT 256 940   Basic Metabolic Panel:  Recent Labs Lab 04/22/16 1021 04/23/16 0554  NA 134* 139  K 3.5 4.0  CL 104 107  CO2 22 24  GLUCOSE 112* 86  BUN 16 18  CREATININE 0.82 1.14*  CALCIUM 9.8 9.4   GFR: CrCl cannot be calculated (Unknown ideal weight.). Liver Function Tests:  Recent Labs Lab 04/22/16 1021 04/23/16 0554  AST 19 25  ALT 10* 11*  ALKPHOS 74 56  BILITOT 0.3 1.4*  PROT 7.8 6.9  ALBUMIN 4.1 3.6   Thyroid Function Tests:  Recent Labs  04/22/16 1657  TSH 3.075   Anemia Panel:  Recent Labs  04/22/16 1657  VITAMINB12 243   Sepsis Labs:  Recent Labs Lab 04/22/16 1032  LATICACIDVEN 0.97    Recent Results (from the past 240 hour(s))  Urine culture     Status: None   Collection Time: 04/22/16 10:13 AM  Result Value Ref Range Status   Specimen Description URINE, CATHETERIZED  Final   Special Requests NONE  Final   Culture   Final    NO GROWTH Performed at [facility], [place] [address], [place], [place] [address]    Report Status 04/23/2016 FINAL  Final  Culture, blood (Routine X 2) w Reflex to ID Panel     Status: None (Preliminary result)   Collection Time: 04/22/16 10:21 AM  Result Value Ref Range Status   Specimen Description BLOOD BLOOD RIGHT FOREARM  Final   Special Requests BOTTLES DRAWN AEROBIC AND ANAEROBIC 5 CC EA  Final   Culture   Final    NO GROWTH 1 DAY Performed at [facility], [place] [address], [place], [place] [address]    Report Status PENDING  Incomplete  Culture, blood (Routine X 2) w Reflex to ID Panel     Status: None (Preliminary result)   Collection Time: 04/22/16 10:48 AM  Result Value Ref Range Status   Specimen Description BLOOD LEFT ANTECUBITAL  Final   Special Requests BOTTLES DRAWN AEROBIC AND ANAEROBIC 5CC  Final   Culture   Final    NO GROWTH 1 DAY Performed at [facility], [place] [address], [place], [place] [address]    Report Status PENDING  Incomplete      Radiology Studies: Ct Head Wo Contrast  Result Date: 04/22/2016 CLINICAL DATA:  Altered mental status. EXAM: CT HEAD WITHOUT CONTRAST TECHNIQUE: Contiguous axial images were obtained from the base of the skull through the vertex without intravenous contrast. COMPARISON:  08/17/2015 FINDINGS: Brain: There is atrophy and chronic small vessel disease changes. No acute intracranial abnormality. Specifically, no hemorrhage, hydrocephalus, mass lesion, acute infarction, or significant intracranial injury. Vascular: No hyperdense vessel or unexpected calcification. Skull: No acute calvarial abnormality. Sinuses/Orbits: Frothy material within the left sphenoid sinus. Paranasal sinuses and mastoids otherwise clear. Orbital soft tissues unremarkable. Other:  None IMPRESSION: No acute intracranial abnormality. Atrophy, chronic microvascular disease. Electronically Signed   By: Rolm Baptise M.D.   On: 04/22/2016 12:14   Dg Chest Port 1 View  Result Date:  04/22/2016 CLINICAL DATA:  Altered mental status.  Shortness of breath today. EXAM: PORTABLE CHEST 1 VIEW COMPARISON:  PA and lateral chest 03/08/2015. FINDINGS: Lungs are clear. Heart size is normal. No pneumothorax or pleural effusion. No focal bony abnormality. IMPRESSION: Negative chest. Electronically Signed   By: Inge Rise M.D.   On: 04/22/2016 10:44     Scheduled Meds: . enoxaparin (LOVENOX) injection  40 mg Subcutaneous Q24H  . galantamine  8 mg Oral BID WC  . irbesartan  300 mg Oral Daily  . iron polysaccharides  150 mg Oral Daily  . iron polysaccharides  150 mg Oral Q breakfast   And  . iron polysaccharides  300 mg Oral BID AC  . levothyroxine  100 mcg Oral QAC breakfast  . nisoldipine  17 mg Oral Daily  . pantoprazole  80 mg Oral  Daily  . traZODone  25 mg Oral QHS  . Vitamin D (Ergocalciferol)  50,000 Units Oral Q7 days   Continuous Infusions: . sodium chloride 75 mL/hr at 04/23/16 1150     LOS: 0 days    Chipper Oman, MD Pager: Text Page via [email or web address]  [phone]  If 7PM-7AM, please contact night-coverage [email or web address] Password [facility] 04/23/2016, 6:59 PM

## 2016-04-24 ENCOUNTER — Inpatient Hospital Stay (HOSPITAL_COMMUNITY): Payer: Medicare Other

## 2016-04-24 DIAGNOSIS — G9340 Encephalopathy, unspecified: Secondary | ICD-10-CM

## 2016-04-24 LAB — BASIC METABOLIC PANEL
Anion gap: 6 (ref 5–15)
BUN: 15 mg/dL (ref 4–21)
BUN: 15 mg/dL (ref 6–20)
CHLORIDE: 104 mmol/L (ref 101–111)
CO2: 26 mmol/L (ref 22–32)
CREATININE: 1.1 mg/dL (ref 0.5–1.1)
Calcium: 9.3 mg/dL (ref 8.9–10.3)
Creatinine, Ser: 1.06 mg/dL — ABNORMAL HIGH (ref 0.44–1.00)
GFR calc Af Amer: 55 mL/min — ABNORMAL LOW (ref >60)
GFR calc non Af Amer: 48 mL/min — ABNORMAL LOW (ref >60)
GLUCOSE: 88 mg/dL (ref 65–99)
Glucose: 88 mg/dL
POTASSIUM: 3.2 mmol/L — AB (ref 3.5–5.1)
Potassium: 3.2 mmol/L — AB (ref 3.4–5.3)
Sodium: 136 mmol/L (ref 135–145)
Sodium: 136 mmol/L — AB (ref 137–147)

## 2016-04-24 MED ORDER — QUETIAPINE FUMARATE 25 MG PO TABS: 25.0000 mg | ORAL_TABLET | Freq: Every day | ORAL | Status: DC

## 2016-04-24 MED ORDER — POTASSIUM CHLORIDE CRYS ER 20 MEQ PO TBCR
40.0000 meq | EXTENDED_RELEASE_TABLET | Freq: Once | ORAL | Status: AC
  Administered 2016-04-24: 40 meq via ORAL
  Filled 2016-04-24: qty 2

## 2016-04-24 NOTE — Care Management Note (Signed)
Case Management Note  Patient Details  Name: Brianna [name] MRN: [id number] Date of Birth: 10-06-1932  Subjective/Objective:    Confusion/ams/lives at home with dtg.                Action/Plan:Date:  April 24, 2016 Chart reviewed for concurrent status and case management needs. Will continue to follow patient progress. Discharge Planning: following for needs Expected discharge date: [id number] Velva Harman, BSN, [place], [place]   Expected Discharge Date:   (unknown)               Expected Discharge Plan:  Home/Self Care  In-House Referral:  Clinical Social Work  Discharge planning Services     Post Acute Care Choice:    Choice offered to:     DME Arranged:    DME Agency:     HH Arranged:    [place] Agency:     Status of Service:  In process, will continue to follow  If discussed at Long Length of Stay Meetings, dates discussed:    Additional Comments:  Leeroy Cha, RN 04/24/2016, 10:58 AM

## 2016-04-24 NOTE — Progress Notes (Addendum)
[facility] Progress Note  Patient: Brianna [name] WGN:[id number]   PCP: Sheela Stack, MD DOB: 09-19-1932   DOA: 04/22/2016   DOS: 04/24/2016   Date of Service: the patient was seen and examined on 04/24/2016  Subjective: Patient was not following any commands, aspirin when necessary patient has been like this since morning. After my initial evaluation I went and saw the patient again at which time she was communicating appropriately with her daughter.  Brief hospital course: Pt. with PMH of hypertension, dyslipidemia, dementia; admitted on 04/22/2016, with complaint of confusion, was found to have acute encephalopathy likely progression of dementia with possible UTI. Currently further plan is monitor for improvement in encephalopathy.  Assessment and Plan: 1. Acute encephalopathy. Likely progression of dementia. Possibility of UTI Patient was started on IV ceftriaxone, urine culture currently pending. This morning patient was again not following any command although) that the patient was doing it on purpose rather than having any organic disorder. We will get MRI of the brain to rule out any acute intracranial abnormality. Starting the patient on Seroquel. Discussed with the daughter that this may be progression of her dementia and may not get better.  2. Hypothyroidism. TSH normal. Next and continue Synthroid.  Iron deficiency anemia Continue iron supplementation  Hypertension Continue home medication   Hypokalemia. Replacing.  Bowel regimen: last BM 04/23/2016 Diet: Cardiac diet DVT Prophylaxis: subcutaneous Heparin  Advance goals of care discussion: full code  Family Communication: family was present at bedside, at the time of interview. The pt provided permission to discuss medical plan with the family. Opportunity was given to ask question and all questions were answered satisfactorily.   Disposition:  Discharge to home home health vs SNF. Expected  discharge date: 04/25/2016  Consultants: none Procedures: none  Antibiotics: Anti-infectives    Start     Dose/Rate Route Frequency Ordered Stop   04/23/16 2000  cefTRIAXone (ROCEPHIN) 1 g in dextrose 5 % 50 mL IVPB     1 g 100 mL/hr over 30 Minutes Intravenous Every 24 hours 04/23/16 1951         Objective: Physical Exam: Vitals:   04/23/16 2105 04/23/16 2111 04/24/16 0452 04/24/16 1450  BP:  (!) 151/89 (!) 147/74 128/62  Pulse:  87 86 (!) 103  Resp:  18 18 18   Temp:  97.8 F (36.6 C) 97.4 F (36.3 C) 98.6 F (37 C)  TempSrc:  Oral Oral Oral  SpO2:  98% 94% 91%  Weight: 54.8 kg (120 lb 13 oz)     Height: 4\' 11"  (1.499 m)       Intake/Output Summary (Last 24 hours) at 04/24/16 1458 Last data filed at 04/24/16 1034  Gross per 24 hour  Intake            662.5 ml  Output             1050 ml  Net           -387.5 ml   Filed Weights   04/23/16 2105  Weight: 54.8 kg (120 lb 13 oz)   General: Alert, Awake and Oriented to Person. Appear in mild distress, affect appropriate Eyes: PERRL Conjunctiva normal ENT: Oral Mucosa clear moist. Neck: difficult to assess JVD, no Abnormal Mass Or lumps Cardiovascular: S1 and S2 Present, aortic systolic Murmur, Respiratory: Bilateral Air entry equal and Decreased, no use of accessory muscle, Clear to Auscultation, no Crackles, no wheezes Abdomen: Bowel Sound present, Soft and no tenderness Skin: no redness, no  Rash, no induration Extremities: no Pedal edema, no calf tenderness Neurologic: Grossly no focal neuro deficit. Bilaterally Equal motor strength  Data Reviewed: CBC:  Recent Labs Lab 04/22/16 1021 04/23/16 0554  WBC 8.7 7.4  HGB 12.2 12.2  HCT 36.8 37.8  MCV 80.7 83.6  PLT 256 295   Basic Metabolic Panel:  Recent Labs Lab 04/22/16 1021 04/23/16 0554 04/24/16 0618  NA 134* 139 136  K 3.5 4.0 3.2*  CL 104 107 104  CO2 22 24 26   GLUCOSE 112* 86 88  BUN 16 18 15   CREATININE 0.82 1.14* 1.06*  CALCIUM 9.8 9.4  9.3    Liver Function Tests:  Recent Labs Lab 04/22/16 1021 04/23/16 0554  AST 19 25  ALT 10* 11*  ALKPHOS 74 56  BILITOT 0.3 1.4*  PROT 7.8 6.9  ALBUMIN 4.1 3.6   No results for input(s): LIPASE, AMYLASE in the last 168 hours. No results for input(s): AMMONIA in the last 168 hours. Coagulation Profile: No results for input(s): INR, PROTIME in the last 168 hours. Cardiac Enzymes: No results for input(s): CKTOTAL, CKMB, CKMBINDEX, TROPONINI in the last 168 hours. BNP (last 3 results) No results for input(s): PROBNP in the last 8760 hours. CBG: No results for input(s): GLUCAP in the last 168 hours. Studies: Mr Brain Wo Contrast  Result Date: 04/24/2016 CLINICAL DATA:  Acute encephalopathy.  History of dementia. EXAM: MRI HEAD WITHOUT CONTRAST TECHNIQUE: Multiplanar, multiecho pulse sequences of the brain and surrounding structures were obtained without intravenous contrast. COMPARISON:  Head CT 04/22/2016 and MRI 06/15/2015 FINDINGS: Brain: There is no evidence of acute infarct, intracranial hemorrhage, mass, midline shift, or extra-axial fluid collection. There is moderate cerebral atrophy, progressed from 2007. Patchy cerebral white matter T2 hyperintensities have progressed from 2007 and are nonspecific but compatible with chronic small vessel ischemic disease, mild-to-moderate for age. Vascular: Major intracranial vascular flow voids are preserved. Skull and upper cervical spine: No suspicious marrow lesion. Advanced C3-4 disc degeneration. Sinuses/Orbits: Unremarkable orbits. Left sphenoid sinus bubbly secretions with near complete sinus opacification. Mild right ethmoid air cell mucosal thickening. Clear mastoid air cells. Other: None. IMPRESSION: 1. No acute intracranial abnormality. 2. Moderate cerebral atrophy and chronic small vessel ischemic disease. Electronically Signed   By: Logan Bores M.D.   On: 04/24/2016 13:11    Scheduled Meds: . cefTRIAXone (ROCEPHIN)  IV  1 g  Intravenous Q24H  . enoxaparin (LOVENOX) injection  40 mg Subcutaneous Q24H  . galantamine  8 mg Oral BID WC  . irbesartan  300 mg Oral Daily  . iron polysaccharides  150 mg Oral Q breakfast   And  . iron polysaccharides  300 mg Oral BID AC  . levothyroxine  100 mcg Oral QAC breakfast  . nisoldipine  17 mg Oral Daily  . pantoprazole  80 mg Oral Daily  . QUEtiapine  25 mg Oral QHS  . Vitamin D (Ergocalciferol)  50,000 Units Oral Q7 days   Continuous Infusions: PRN Meds: acetaminophen **OR** acetaminophen, ondansetron **OR** ondansetron (ZOFRAN) IV  Time spent: 30 minutes  Author: Berle Mull, MD [facility] Pager: [phone] 04/24/2016 2:58 PM  If 7PM-7AM, please contact night-coverage at [email or web address], password [facility]

## 2016-04-25 LAB — URINE CULTURE

## 2016-04-25 LAB — BASIC METABOLIC PANEL
Anion gap: 11 (ref 5–15)
BUN: 14 mg/dL (ref 4–21)
BUN: 14 mg/dL (ref 6–20)
CALCIUM: 9.4 mg/dL (ref 8.9–10.3)
CO2: 25 mmol/L (ref 22–32)
CREATININE: 1 mg/dL (ref 0.5–1.1)
CREATININE: 0.96 mg/dL (ref 0.44–1.00)
Chloride: 97 mmol/L — ABNORMAL LOW (ref 101–111)
GFR calc Af Amer: >60 mL/min (ref >60)
GFR, EST NON AFRICAN AMERICAN: 54 mL/min — AB (ref >60)
GLUCOSE: 103 mg/dL
GLUCOSE: 103 mg/dL — AB (ref 65–99)
Potassium: 3.9 mmol/L (ref 3.5–5.1)
SODIUM: 133 mmol/L — AB (ref 137–147)
SODIUM: 133 mmol/L — AB (ref 135–145)

## 2016-04-25 LAB — CBC WITH DIFFERENTIAL/PLATELET
BASOS PCT: 0 %
Basophils Absolute: 0 10*3/uL (ref 0.0–0.1)
EOS ABS: 0 10*3/uL (ref 0.0–0.7)
EOS PCT: 0 %
HEMATOCRIT: 38.5 % (ref 36.0–46.0)
Hemoglobin: 12.7 g/dL (ref 12.0–15.0)
Lymphocytes Relative: 27 %
Lymphs Abs: 2.5 10*3/uL (ref 0.7–4.0)
MCH: 27.4 pg (ref 26.0–34.0)
MCHC: 33 g/dL (ref 30.0–36.0)
MCV: 83 fL (ref 78.0–100.0)
MONO ABS: 0.8 10*3/uL (ref 0.1–1.0)
MONOS PCT: 8 %
NEUTROS ABS: 5.9 10*3/uL (ref 1.7–7.7)
Neutrophils Relative %: 65 %
PLATELETS: 281 10*3/uL (ref 150–400)
RBC: 4.64 MIL/uL (ref 3.87–5.11)
RDW: 14.6 % (ref 11.5–15.5)
WBC: 9.2 10*3/uL (ref 4.0–10.5)

## 2016-04-25 LAB — CBC AND DIFFERENTIAL: WBC: 9.2 10*3/mL

## 2016-04-25 LAB — MAGNESIUM: MAGNESIUM: 1.7 mg/dL (ref 1.7–2.4)

## 2016-04-25 MED ORDER — CYANOCOBALAMIN 1000 MCG/ML IJ SOLN
1000.0000 ug | Freq: Once | INTRAMUSCULAR | Status: AC
  Administered 2016-04-25: 1000 ug via SUBCUTANEOUS
  Filled 2016-04-25: qty 1

## 2016-04-25 MED ORDER — MECLIZINE HCL 25 MG PO TABS
25.0000 mg | ORAL_TABLET | Freq: Once | ORAL | Status: AC
Start: 2016-04-25 — End: 2016-04-25
  Administered 2016-04-25: 25 mg via ORAL
  Filled 2016-04-25: qty 1

## 2016-04-25 NOTE — NC FL2 (Signed)
[place] LEVEL OF CARE SCREENING TOOL     IDENTIFICATION  Patient Name: Brianna [name] Birthdate: 1932-04-21 Sex: female Admission Date (Current Location): 04/22/2016  [facility] and [place] Number:  Herbalist and Address:  [facility],  [address], [place]      Provider Number: [id number]  Attending Physician Name and Address:  Lavina Hamman, MD  Relative Name and Phone Number:    [name] (daughter) [phone]    Current Level of Care: Hospital Recommended Level of Care: [place] Prior Approval Number:    Date Approved/Denied:   PASRR Number:    [id number]  Discharge Plan: SNF    Current Diagnoses: Patient Active Problem List   Diagnosis Date Noted  . Delirium 04/22/2016  . Leukocytosis 03/19/2015  . AKI (acute kidney injury) ([place]) 03/14/2015  . GERD (gastroesophageal reflux disease) 03/14/2015  . Diaper dermatitis 03/14/2015  . Syncope 03/08/2015  . Physical deconditioning 03/08/2015  . Alzheimer's dementia without behavioral disturbance 10/11/2014  . Iron deficiency anemia 10/26/2013  . Diarrhea 10/26/2013  . Near syncope 09/06/2013  . Hypothyroid 09/06/2013  . Hyponatremia 02/13/2013  . Weakness 02/13/2013  . 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 Hypersomnia with sleep apnea, unspecified 11/17/2012  . DUB (dysfunctional uterine bleeding)   . Hypertension   . Dementia   . Elevated cholesterol   . Kidney stone     Orientation RESPIRATION BLADDER Height & Weight     Self, Place  Normal Continent Weight: 120 lb 13 oz (54.8 kg) Height:  4\' 11"  (149.9 cm)  BEHAVIORAL SYMPTOMS/MOOD NEUROLOGICAL BOWEL NUTRITION STATUS      Continent Diet (Heart Healthy )  AMBULATORY STATUS COMMUNICATION OF NEEDS Skin   Limited Assist Verbally Skin abrasions (Ecchymosis)                       Personal Care Assistance Level of Assistance  Bathing,  Feeding, Dressing Bathing Assistance: Limited assistance Feeding assistance: Independent Dressing Assistance: Limited assistance     Functional Limitations Info  Sight, Hearing, Speech Sight Info: Adequate Hearing Info: Adequate Speech Info: Adequate    SPECIAL CARE FACTORS FREQUENCY  PT (By licensed PT)     PT Frequency: 5              Contractures Contractures Info: Not present    Additional Factors Info  Code Status, Allergies Code Status Info: Fullcode Allergies Info: Penicillins           Current Medications (04/25/2016):  This is the current hospital active medication list Current Facility-Administered Medications  Medication Dose Route Frequency Provider Last Rate Last Dose  . acetaminophen (TYLENOL) tablet 650 mg  650 mg Oral Q6H PRN Costin Karlyne Greenspan, MD       Or  . acetaminophen (TYLENOL) suppository 650 mg  650 mg Rectal Q6H PRN Costin Karlyne Greenspan, MD      . cefTRIAXone (ROCEPHIN) 1 g in dextrose 5 % 50 mL IVPB  1 g Intravenous Q24H Polly Cobia, RPH   1 g at 04/24/16 2000  . enoxaparin (LOVENOX) injection 40 mg  40 mg Subcutaneous Q24H Caren Griffins, MD   40 mg at 04/24/16 1834  . galantamine (RAZADYNE) tablet 8 mg  8 mg Oral BID WC Costin Karlyne Greenspan, MD   8 mg at 04/25/16 1016  . irbesartan (AVAPRO) tablet 300 mg  300 mg Oral Daily Costin M  Gherghe, MD   300 mg at 04/25/16 1010  . iron polysaccharides (NIFEREX) capsule 150 mg  150 mg Oral Q breakfast Costin Karlyne Greenspan, MD   150 mg at 04/25/16 1010   And  . iron polysaccharides (NIFEREX) capsule 300 mg  300 mg Oral BID AC Costin Karlyne Greenspan, MD   300 mg at 04/25/16 1150  . levothyroxine (SYNTHROID, LEVOTHROID) tablet 100 mcg  100 mcg Oral QAC breakfast Caren Griffins, MD   100 mcg at 04/25/16 1010  . nisoldipine (SULAR) 24 hr tablet 17 mg  17 mg Oral Daily Costin Karlyne Greenspan, MD   17 mg at 04/25/16 1010  . ondansetron (ZOFRAN) tablet 4 mg  4 mg Oral Q6H PRN Costin Karlyne Greenspan, MD       Or  . ondansetron  [facility]) injection 4 mg  4 mg Intravenous Q6H PRN Caren Griffins, MD   4 mg at 04/24/16 2013  . pantoprazole (PROTONIX) EC tablet 80 mg  80 mg Oral Daily Caren Griffins, MD   80 mg at 04/25/16 1010  . QUEtiapine (SEROQUEL) tablet 25 mg  25 mg Oral QHS Lavina Hamman, MD      . Vitamin D (Ergocalciferol) (DRISDOL) capsule 50,000 Units  50,000 Units Oral Q7 days Caren Griffins, MD   50,000 Units at 04/23/16 1035     Discharge Medications: Please see discharge summary for a list of discharge medications.  Relevant Imaging Results:  Relevant Lab Results:   Additional Information SS# [id number]  Lia Hopping, LCSW

## 2016-04-25 NOTE — Progress Notes (Addendum)
[facility] Progress Note  Patient: Brianna [name] NWG:[id number]   PCP: Sheela Stack, MD DOB: [birth year]   DOA: 04/22/2016   DOS: 04/25/2016   Date of Service: the patient was seen and examined on 04/25/2016  Subjective:Awake this morning, able to follow commands, later on in the afternoon physical therapy attempted to work with the patient she refused.  Brief hospital course: Pt. with PMH of hypertension, dyslipidemia, dementia; admitted on 04/22/2016, with complaint of confusion, was found to have acute encephalopathy likely progression of dementia with possible UTI. Currently further plan is monitor for improvement in encephalopathy.  Assessment and Plan: 1. Acute encephalopathy. Likely progression of dementia. Possibility of UTI B-12 deficiency Patient was started on IV ceftriaxone, initial urine culture shows multiple species, repeat urine culture currently pending. MRI of the brain rule out any acute intracranial abnormality. Starting the patient on Seroquel. Discussed with the daughter that this may be progression of her dementia and may not get better. With poor oral intake monitor the patient overnight. We will replace B-12 subcutaneously.   2. Hypothyroidism. TSH normal.  continue Synthroid.  Iron deficiency anemia Continue iron supplementation  Hypertension Continue home medication   Hypokalemia. Replacing.  Bowel regimen: last BM 04/23/2016 Diet: Cardiac diet DVT Prophylaxis: subcutaneous Heparin  Advance goals of care discussion: full code  Family Communication: family was present at bedside, at the time of interview. The pt provided permission to discuss medical plan with the family. Opportunity was given to ask question and all questions were answered satisfactorily.   Disposition:  Discharge to home home health vs SNF. Expected discharge date: 04/26/2016  Consultants: none Procedures: none  Antibiotics: Anti-infectives    Start      Dose/Rate Route Frequency Ordered Stop   04/23/16 2000  cefTRIAXone (ROCEPHIN) 1 g in dextrose 5 % 50 mL IVPB     1 g 100 mL/hr over 30 Minutes Intravenous Every 24 hours 04/23/16 1951         Objective: Physical Exam: Vitals:   04/24/16 1459 04/24/16 2145 04/25/16 0618 04/25/16 1405  BP: 125/66 (!) 145/58 (!) 129/45 (!) 155/80  Pulse: 72 92 63 65  Resp: 18 18 18 18   Temp: 98.4 F (36.9 C) 97.6 F (36.4 C) 98.3 F (36.8 C) 97.9 F (36.6 C)  TempSrc: Oral Oral Oral Oral  SpO2: 97% 98% 97% 98%  Weight:      Height:        Intake/Output Summary (Last 24 hours) at 04/25/16 1518 Last data filed at 04/25/16 0900  Gross per 24 hour  Intake              410 ml  Output              650 ml  Net             -240 ml   Filed Weights   04/23/16 2105  Weight: 54.8 kg (120 lb 13 oz)   General: Alert, Awake and Oriented to Person. Appear in mild distress, affect appropriate Eyes: PERRL Conjunctiva normal ENT: Oral Mucosa clear moist. Neck: difficult to assess JVD, no Abnormal Mass Or lumps Cardiovascular: S1 and S2 Present, aortic systolic Murmur, Respiratory: Bilateral Air entry equal and Decreased, no use of accessory muscle, Clear to Auscultation, no Crackles, no wheezes Abdomen: Bowel Sound present, Soft and no tenderness Skin: no redness, no Rash, no induration Extremities: no Pedal edema, no calf tenderness Neurologic: Grossly no focal neuro deficit. Bilaterally Equal motor strength  Data  Reviewed: CBC:  Recent Labs Lab 04/22/16 1021 04/23/16 0554 04/25/16 1358  WBC 8.7 7.4 9.2  NEUTROABS  --   --  5.9  HGB 12.2 12.2 12.7  HCT 36.8 37.8 38.5  MCV 80.7 83.6 83.0  PLT 256 287 414   Basic Metabolic Panel:  Recent Labs Lab 04/22/16 1021 04/23/16 0554 04/24/16 0618 04/25/16 1358  NA 134* 139 136 133*  K 3.5 4.0 3.2* 3.9  CL 104 107 104 97*  CO2 22 24 26 25   GLUCOSE 112* 86 88 103*  BUN 16 18 15 14   CREATININE 0.82 1.14* 1.06* 0.96  CALCIUM 9.8 9.4 9.3 9.4    MG  --   --   --  1.7    Liver Function Tests:  Recent Labs Lab 04/22/16 1021 04/23/16 0554  AST 19 25  ALT 10* 11*  ALKPHOS 74 56  BILITOT 0.3 1.4*  PROT 7.8 6.9  ALBUMIN 4.1 3.6   No results for input(s): LIPASE, AMYLASE in the last 168 hours. No results for input(s): AMMONIA in the last 168 hours. Coagulation Profile: No results for input(s): INR, PROTIME in the last 168 hours. Cardiac Enzymes: No results for input(s): CKTOTAL, CKMB, CKMBINDEX, TROPONINI in the last 168 hours. BNP (last 3 results) No results for input(s): PROBNP in the last 8760 hours. CBG: No results for input(s): GLUCAP in the last 168 hours. Studies: No results found.  Scheduled Meds: . cefTRIAXone (ROCEPHIN)  IV  1 g Intravenous Q24H  . cyanocobalamin  1,000 mcg Subcutaneous Once  . enoxaparin (LOVENOX) injection  40 mg Subcutaneous Q24H  . galantamine  8 mg Oral BID WC  . irbesartan  300 mg Oral Daily  . iron polysaccharides  150 mg Oral Q breakfast   And  . iron polysaccharides  300 mg Oral BID AC  . levothyroxine  100 mcg Oral QAC breakfast  . nisoldipine  17 mg Oral Daily  . pantoprazole  80 mg Oral Daily  . QUEtiapine  25 mg Oral QHS  . Vitamin D (Ergocalciferol)  50,000 Units Oral Q7 days   Continuous Infusions: PRN Meds: acetaminophen **OR** acetaminophen, ondansetron **OR** ondansetron (ZOFRAN) IV  Time spent: 30 minutes  Author: Berle Mull, MD [facility] Pager: [phone] 04/25/2016 3:18 PM  If 7PM-7AM, please contact night-coverage at [email or web address], password [facility]

## 2016-04-25 NOTE — Progress Notes (Signed)
PT Cancellation Note  Patient Details Name: Brianna [name] MRN: [id number] DOB: Jun 13, 1932   Cancelled Treatment:    Reason Eval/Treat Not Completed: Patient's level of consciousness (pt is refusing, states that she is not getting up.  RN in and attempted encouragement but still refused. more agitated with  attmepts. )   Claretha Cooper 04/25/2016, 1:44 PM Tresa Endo PT [phone]

## 2016-04-25 NOTE — Clinical Social Work Note (Addendum)
Clinical Social Work Assessment  Patient Details  Name: Brianna [name] MRN: [id number] Date of Birth: [birth year]  Date of referral:  04/25/16               Reason for consult:  Facility Placement                Permission sought to share information with:  Facility Sport and exercise psychologist, Family Supports Permission granted to share information::     Name::     Desiree Lucy  Agency::   SNF  Relationship::  Daughter   Contact Information:  [phone]  Housing/Transportation Living arrangements for the past 2 months:  [place] of Information:  Adult Children Patient Interpreter Needed:  None Criminal Activity/Legal Involvement Pertinent to Current Situation/Hospitalization:    Significant Relationships:  Adult Children, Spouse Lives with:  Spouse Do you feel safe going back to the place where you live?  Yes Need for family participation in patient care:  No (Coment)  Care giving concerns:  SNF placement   Social Worker assessment / plan: CSW met with patient and daughter at bedside, explain role and reason for CSW intervention. Patient daughter reports the patient has been to [facility] facility in the past, therefore she is familiar with the process. She reports the patient lives at home with her elderly husband and he cannot care for her alone at this time. Patient will need rehab before returning home. Patient daughter understands patient will need a three night qualifying stay for insurance to help with cost of stay at SNF.  Plan: Complete PASRR,Complete FL2 and provide bed offers.   Employment status:  Retired Forensic scientist:  Medicare PT Recommendations:  [place] / Referral to community resources:  [place]  Patient/Family's Response to care:  Patient and family responding well to care.   Patient/Family's Understanding of and Emotional Response to Diagnosis, Current Treatment, and Prognosis:   Patient daughter very involved in care, she reports she wants patient get stronger and return home.   Emotional Assessment Appearance:  Developmentally appropriate Attitude/Demeanor/Rapport:    Affect (typically observed):  Accepting Orientation:  Oriented to Self Alcohol / Substance use:  Not Applicable Psych involvement (Current and /or in the community):  No (Comment)  Discharge Needs  Concerns to be addressed:  Discharge Planning Concerns, Care Coordination Readmission within the last 30 days:  No Current discharge risk:  Dependent with Mobility Barriers to Discharge:  Continued Medical Work up   [employer], LCSW 04/25/2016, 1:20 PM

## 2016-04-26 DIAGNOSIS — G9341 Metabolic encephalopathy: Secondary | ICD-10-CM | POA: Diagnosis not present

## 2016-04-26 DIAGNOSIS — G301 Alzheimer's disease with late onset: Secondary | ICD-10-CM | POA: Diagnosis not present

## 2016-04-26 DIAGNOSIS — D509 Iron deficiency anemia, unspecified: Secondary | ICD-10-CM | POA: Diagnosis not present

## 2016-04-26 DIAGNOSIS — E039 Hypothyroidism, unspecified: Secondary | ICD-10-CM | POA: Diagnosis not present

## 2016-04-26 DIAGNOSIS — I10 Essential (primary) hypertension: Secondary | ICD-10-CM | POA: Diagnosis not present

## 2016-04-26 DIAGNOSIS — K219 Gastro-esophageal reflux disease without esophagitis: Secondary | ICD-10-CM | POA: Diagnosis not present

## 2016-04-26 DIAGNOSIS — D508 Other iron deficiency anemias: Secondary | ICD-10-CM | POA: Diagnosis not present

## 2016-04-26 DIAGNOSIS — R488 Other symbolic dysfunctions: Secondary | ICD-10-CM | POA: Diagnosis not present

## 2016-04-26 DIAGNOSIS — R4182 Altered mental status, unspecified: Secondary | ICD-10-CM | POA: Diagnosis not present

## 2016-04-26 DIAGNOSIS — F5105 Insomnia due to other mental disorder: Secondary | ICD-10-CM | POA: Diagnosis not present

## 2016-04-26 DIAGNOSIS — M6281 Muscle weakness (generalized): Secondary | ICD-10-CM | POA: Diagnosis not present

## 2016-04-26 DIAGNOSIS — R410 Disorientation, unspecified: Secondary | ICD-10-CM | POA: Diagnosis not present

## 2016-04-26 DIAGNOSIS — E034 Atrophy of thyroid (acquired): Secondary | ICD-10-CM | POA: Diagnosis not present

## 2016-04-26 DIAGNOSIS — D638 Anemia in other chronic diseases classified elsewhere: Secondary | ICD-10-CM | POA: Diagnosis not present

## 2016-04-26 DIAGNOSIS — G9340 Encephalopathy, unspecified: Secondary | ICD-10-CM | POA: Diagnosis not present

## 2016-04-26 DIAGNOSIS — F99 Mental disorder, not otherwise specified: Secondary | ICD-10-CM | POA: Diagnosis not present

## 2016-04-26 DIAGNOSIS — E559 Vitamin D deficiency, unspecified: Secondary | ICD-10-CM | POA: Diagnosis not present

## 2016-04-26 DIAGNOSIS — F0281 Dementia in other diseases classified elsewhere with behavioral disturbance: Secondary | ICD-10-CM | POA: Diagnosis not present

## 2016-04-26 DIAGNOSIS — F329 Major depressive disorder, single episode, unspecified: Secondary | ICD-10-CM | POA: Diagnosis not present

## 2016-04-26 HISTORY — DX: Metabolic encephalopathy: G93.41

## 2016-04-26 MED ORDER — QUETIAPINE FUMARATE 25 MG PO TABS: 25.0000 mg | ORAL_TABLET | Freq: Every day | ORAL | 0 refills | Status: DC

## 2016-04-26 MED ORDER — VITAMIN B-12 1000 MCG PO TABS: 1000.0000 ug | ORAL_TABLET | Freq: Every day | ORAL | 0 refills | Status: DC

## 2016-04-26 NOTE — Progress Notes (Signed)
[facility] scheduled for transport. Nurse given report number. Patient daughter at bedside. No other needs identified at this time.   Kathrin Greathouse, Latanya Presser, MSW Clinical Social Worker 5E and Psychiatric Service Line [phone] 04/26/2016  1:48 PM

## 2016-04-26 NOTE — Progress Notes (Signed)
Attempted to call report to [employer].

## 2016-04-26 NOTE — Evaluation (Signed)
Physical Therapy Evaluation Patient Details Name: Brianna [name] MRN: [id number] DOB: 1933-01-06 Today's Date: 04/26/2016   History of Present Illness  Pt is an 83 year old female with PMH of hypertension, dyslipidemia, dementia; Pt admitted on 04/22/2016, with complaint of confusion, was found to have acute encephalopathy likely progression of dementia with possible UTI.  Clinical Impression  Pt admitted with above diagnosis. Pt currently with functional limitations due to the deficits listed below (see PT Problem List). Pt will benefit from skilled PT to increase their independence and safety with mobility to allow discharge to the venue listed below.   Pt assisted with ambulating short distance in hallway.  Daughter present and reports pt is not at her typical baseline and agreeable to SNF upon d/c.     Follow Up Recommendations SNF;Supervision/Assistance - 24 hour    Equipment Recommendations  None recommended by PT    Recommendations for Other Services       Precautions / Restrictions Precautions Precautions: Fall Restrictions Weight Bearing Restrictions: No      Mobility  Bed Mobility Overal bed mobility: Needs Assistance Bed Mobility: Sit to Supine       Sit to supine: Min guard   General bed mobility comments: pt up on BSC upon entering room, pt entered bed on her knee then rolled into bed  Transfers Overall transfer level: Needs assistance Equipment used: None Transfers: Sit to/from Stand Sit to Stand: Min guard         General transfer comment: leaves walker, min/guard for safety  Ambulation/Gait Ambulation/Gait assistance: Min assist Ambulation Distance (Feet): 50 Feet Assistive device: Rolling walker (2 wheeled) Gait Pattern/deviations: Step-through pattern;Decreased stride length     General Gait Details: pt provided with verbal cues however more respondent to manual cues with therapist moving RW, fatigues quickly (daughter reports pt did not get  much sleep last night)  Stairs            Wheelchair Mobility    Modified Rankin (Stroke Patients Only)       Balance Overall balance assessment: History of Falls                                           Pertinent Vitals/Pain Pain Assessment: Faces (does not appear in distress or pain) Faces Pain Scale: Hurts a little bit    Home Living Family/patient expects to be discharged to:: Skilled nursing facility                 Additional Comments: pt lives with spouse, daughter checks in    Prior Function Level of Independence: Needs assistance   Gait / Transfers Assistance Needed: typically supervision level, able to ambulate without assistive device           Hand Dominance        Extremity/Trunk Assessment   Upper Extremity Assessment Upper Extremity Assessment: Generalized weakness    Lower Extremity Assessment Lower Extremity Assessment: Generalized weakness       Communication   Communication: No difficulties  Cognition Arousal/Alertness: Awake/alert Behavior During Therapy: Flat affect Overall Cognitive Status: Impaired/Different from baseline                                 General Comments: daughter reports pt required assist for feeding, also hygiene with BSC this morning  which is not typical      General Comments      Exercises     Assessment/Plan    PT Assessment Patient needs continued PT services  PT Problem List Decreased strength;Decreased mobility;Decreased safety awareness;Decreased cognition;Decreased activity tolerance;Decreased knowledge of use of DME       PT Treatment Interventions DME instruction;Gait training;Therapeutic activities;Therapeutic exercise;Functional mobility training;Patient/family education    PT Goals (Current goals can be found in the Care Plan section)  Acute Rehab PT Goals PT Goal Formulation: With patient/family Time For Goal Achievement: 05/03/16 Potential  to Achieve Goals: Fair    Frequency Min 3X/week   Barriers to discharge        Co-evaluation               End of Session Equipment Utilized During Treatment: Gait belt Activity Tolerance: Patient limited by fatigue Patient left: in bed;with call bell/phone within reach;with family/visitor present;with bed alarm set Nurse Communication: Mobility status PT Visit Diagnosis: Difficulty in walking, not elsewhere classified (R26.2)    Time: 7425-9563 PT Time Calculation (min) (ACUTE ONLY): 12 min   Charges:   PT Evaluation $PT Eval Low Complexity: 1 Procedure     PT G CodesCarmelia Bake, PT, DPT 04/26/2016 Pager: [phone]   York Ram E 04/26/2016, 9:54 AM

## 2016-04-26 NOTE — Progress Notes (Signed)
Date:  April 26, 2016 Chart reviewed for concurrent status and case management needs. Will continue to follow patient progress. Discharge Planning: following for needs none found Expected discharge date:[id number] Velva Harman, BSN, [place], [place]

## 2016-04-26 NOTE — Clinical Social Work Placement (Signed)
   CLINICAL SOCIAL WORK PLACEMENT  NOTE  Date:  04/26/2016  Patient Details  Name: Brianna [name] MRN: [id number] Date of Birth: August 17, 1932  Clinical Social Work is seeking post-discharge placement for this patient at the [place] level of care (*CSW will initial, date and re-position this form in  chart as items are completed):  Yes   Patient/family provided with [place] Work Department's list of facilities offering this level of care within the geographic area requested by the patient (or if unable, by the patient's family).  Yes   Patient/family informed of their freedom to choose among providers that offer the needed level of care, that participate in Medicare, Medicaid or managed care program needed by the patient, have an available bed and are willing to accept the patient.  Yes   Patient/family informed of [facility]'s ownership interest in [facility] and [facility], as well as of the fact that they are under no obligation to receive care at these facilities.  PASRR submitted to EDS on       PASRR number received on       Existing PASRR number confirmed on 04/25/16     FL2 transmitted to all facilities in geographic area requested by pt/family on       FL2 transmitted to all facilities within larger geographic area on       Patient informed that his/her managed care company has contracts with or will negotiate with certain facilities, including the following:  [facility]         Patient/family informed of bed offers received.  Patient chooses bed at [facility]     Physician recommends and patient chooses bed at      Patient to be transferred to [facility] on 04/26/16.  Patient to be transferred to facility by [facility]     Patient family notified on 04/26/16 of transfer.  Name of family member notified:  Daughter Vikki at bedside     PHYSICIAN Please prepare prescriptions,  Please prepare priority discharge summary, including medications     Additional Comment:    _______________________________________________ Lia Hopping, LCSW 04/26/2016, 9:58 AM

## 2016-04-26 NOTE — Discharge Summary (Addendum)
[facility] Discharge Summary   Patient: Brianna [name] XBM:[id number]   PCP: Julian Hy, MD DOB: 02/01/1932   Date of admission: 04/22/2016   Date of discharge:  04/26/2016    Discharge Diagnoses:  Principal Problem:   Acute metabolic encephalopathy Active Problems:   Hypothyroid   Iron deficiency anemia   Alzheimer's dementia without behavioral disturbance   Delirium  Admitted From: home Disposition:  SNF  Recommendations for Outpatient Follow-up:  1. Please follow up with PCP in 1 week   Follow-up Information    Julian Hy, MD. Schedule an appointment as soon as possible for a visit in 1 week(s).   Specialty:  Endocrinology Contact information: [address] [place] [address] [phone]          Diet recommendation: cardaic diet  Activity: The patient is advised to gradually reintroduce usual activities.  Discharge Condition: good  Code Status: full code  History of present illness: As per the H and P dictated on admission, "Brianna [name] is a 83 y.o. female with medical history significant of hypertension, hyperlipidemia, underlying dementia, presents to the emergency room brought by her daughter from home due to sudden onset of delirium and agitation last night.  Patient has underlying dementia, however has no behavioral disturbances and is generally quite calm and cooperative. Daughter states that this happens usually when she gets an infection. Patient cannot contribute to the story. "  Hospital Course:  Summary of her active problems in the hospital is as following. 1. Acute encephalopathy. Likely progression of dementia. Possibility of UTI B-12 deficiency Patient was started on IV ceftriaxone, initial urine culture shows multiple species, repeat urine culture currently pending. Completed 5 days of Antibiotics.  MRI of the brain rule out any acute intracranial abnormality. Starting the patient on Seroquel. Discussed with the  daughter that this may be progression of her dementia and may not get better. We will replace B-12.   2. Hypothyroidism. TSH normal.  continue Synthroid.  Iron deficiency anemia Continue iron supplementation  Hypertension Continuehome medication   Hypokalemia. Resolved   All other chronic medical condition were stable during the hospitalization.  Patient was seen by physical therapy, who recommended SNF, which was arranged by Child psychotherapist and case Production designer, theatre/television/film. On the day of the discharge the patient's vitals were stable, and no other acute medical condition were reported by patient. the patient was felt safe to be discharge at [facility].  Procedures and Results:  none   Consultations:  none  DISCHARGE MEDICATION: Current Discharge Medication List    START taking these medications   Details  QUEtiapine (SEROQUEL) 25 MG tablet Take 1 tablet (25 mg total) by mouth at bedtime. Qty: 30 tablet, Refills: 0      CONTINUE these medications which have NOT CHANGED   Details  esomeprazole (NEXIUM) 40 MG capsule Take 40 mg by mouth daily.     galantamine (RAZADYNE ER) 16 MG 24 hr capsule TAKE ONE CAPSULE BY MOUTH EVERY DAY WITH BREAKFAST Qty: 30 capsule, Refills: 6    iron polysaccharides (NU-IRON) 150 MG capsule Take 150-300 mg by mouth 3 (three) times daily. Take 1 capsule in the morning Take 2 capsules at lunch    levothyroxine (SYNTHROID, LEVOTHROID) 100 MCG tablet Take 100 mcg by mouth daily. Refills: 5    nisoldipine (SULAR) 17 MG 24 hr tablet Take 17 mg by mouth daily.     PRISTIQ 50 MG 24 hr tablet Take 50 mg by mouth daily.  traZODone (DESYREL) 50 MG tablet Take 25 mg by mouth at bedtime.     valsartan (DIOVAN) 320 MG tablet Take 320 mg by mouth daily.    Vitamin D, Ergocalciferol, (DRISDOL) 50000 UNITS CAPS capsule Take 50,000 Units by mouth every 7 (seven) days. Tuesday       Allergies  Allergen Reactions  . Penicillins Other (See Comments)    "didn't sit  well" Has patient had a PCN reaction causing immediate rash, facial/tongue/throat swelling, SOB or lightheadedness with hypotension: No Has patient had a PCN reaction causing severe rash involving mucus membranes or skin necrosis: No Has patient had a PCN reaction that required hospitalization No Has patient had a PCN reaction occurring within the last 10 years: No If all of the above answers are "NO", then may proceed with Cephalosporin use.   Discharge Instructions    Diet - low sodium heart healthy    Complete by:  As directed    Discharge instructions    Complete by:  As directed    It is important that you read following instructions as well as go over your medication list with RN to help you understand your care after this hospitalization.  Discharge Instructions: Please follow-up with PCP in one week  Please request your primary care physician to go over all Hospital Tests and Procedure/Radiological results at the follow up,  Please get all Hospital records sent to your PCP by signing hospital release before you go home.   Do not drive, operating heavy machinery, perform activities at heights, swimming or participation in water activities or provide baby sitting services. Do not take more than prescribed Pain, Sleep and Anxiety Medications. You were cared for by a hospitalist during your hospital stay. If you have any questions about your discharge medications or the care you received while you were in the hospital after you are discharged, you can call the unit and ask to speak with the hospitalist on call if the hospitalist that took care of you is not available.  Once you are discharged, your primary care physician will handle any further medical issues. Please note that NO REFILLS for any discharge medications will be authorized once you are discharged, as it is imperative that you return to your primary care physician (or establish a relationship with a primary care physician if  you do not have one) for your aftercare needs so that they can reassess your need for medications and monitor your lab values. You Must read complete instructions/literature along with all the possible adverse reactions/side effects for all the Medicines you take and that have been prescribed to you. Take any new Medicines after you have completely understood and accept all the possible adverse reactions/side effects. Wear Seat belts while driving.   Increase activity slowly    Complete by:  As directed      Discharge Exam: Filed Weights   04/23/16 2105  Weight: 54.8 kg (120 lb 13 oz)   Vitals:   04/26/16 0625 04/26/16 0802  BP: (!) 151/53 (!) 154/56  Pulse: 62 (!) 58  Resp: 16   Temp: 97.5 F (36.4 C)    General: Appear in mild distress, no Rash; Oral Mucosa moist. Cardiovascular: S1 and S2 Present, no Murmur, no JVD Respiratory: Bilateral Air entry present and Clear to Auscultation, no Crackles, no wheezes Abdomen: Bowel Sound present, Soft and no tenderness Extremities: no Pedal edema, no calf tenderness Neurology: Grossly no focal neuro deficit.  The results of significant diagnostics from this  hospitalization (including imaging, microbiology, ancillary and laboratory) are listed below for reference.    Significant Diagnostic Studies: Ct Head Wo Contrast  Result Date: 04/22/2016 CLINICAL DATA:  Altered mental status. EXAM: CT HEAD WITHOUT CONTRAST TECHNIQUE: Contiguous axial images were obtained from the base of the skull through the vertex without intravenous contrast. COMPARISON:  08/17/2015 FINDINGS: Brain: There is atrophy and chronic small vessel disease changes. No acute intracranial abnormality. Specifically, no hemorrhage, hydrocephalus, mass lesion, acute infarction, or significant intracranial injury. Vascular: No hyperdense vessel or unexpected calcification. Skull: No acute calvarial abnormality. Sinuses/Orbits: Frothy material within the left sphenoid sinus. Paranasal  sinuses and mastoids otherwise clear. Orbital soft tissues unremarkable. Other:  None IMPRESSION: No acute intracranial abnormality. Atrophy, chronic microvascular disease. Electronically Signed   By: Charlett Nose M.D.   On: 04/22/2016 12:14   Mr Brain Wo Contrast  Result Date: 04/24/2016 CLINICAL DATA:  Acute encephalopathy.  History of dementia. EXAM: MRI HEAD WITHOUT CONTRAST TECHNIQUE: Multiplanar, multiecho pulse sequences of the brain and surrounding structures were obtained without intravenous contrast. COMPARISON:  Head CT 04/22/2016 and MRI 06/15/2015 FINDINGS: Brain: There is no evidence of acute infarct, intracranial hemorrhage, mass, midline shift, or extra-axial fluid collection. There is moderate cerebral atrophy, progressed from 2007. Patchy cerebral white matter T2 hyperintensities have progressed from 2007 and are nonspecific but compatible with chronic small vessel ischemic disease, mild-to-moderate for age. Vascular: Major intracranial vascular flow voids are preserved. Skull and upper cervical spine: No suspicious marrow lesion. Advanced C3-4 disc degeneration. Sinuses/Orbits: Unremarkable orbits. Left sphenoid sinus bubbly secretions with near complete sinus opacification. Mild right ethmoid air cell mucosal thickening. Clear mastoid air cells. Other: None. IMPRESSION: 1. No acute intracranial abnormality. 2. Moderate cerebral atrophy and chronic small vessel ischemic disease. Electronically Signed   By: Sebastian Ache M.D.   On: 04/24/2016 13:11   Dg Chest Port 1 View  Result Date: 04/22/2016 CLINICAL DATA:  Altered mental status.  Shortness of breath today. EXAM: PORTABLE CHEST 1 VIEW COMPARISON:  PA and lateral chest 03/08/2015. FINDINGS: Lungs are clear. Heart size is normal. No pneumothorax or pleural effusion. No focal bony abnormality. IMPRESSION: Negative chest. Electronically Signed   By: Drusilla Kanner M.D.   On: 04/22/2016 10:44    Microbiology: Recent Results (from the  past 240 hour(s))  Urine culture     Status: None   Collection Time: 04/22/16 10:13 AM  Result Value Ref Range Status   Specimen Description URINE, CATHETERIZED  Final   Special Requests NONE  Final   Culture   Final    NO GROWTH Performed at [facility], [address] [place], [place] [address]    Report Status 04/23/2016 FINAL  Final  Culture, blood (Routine X 2) w Reflex to ID Panel     Status: None (Preliminary result)   Collection Time: 04/22/16 10:21 AM  Result Value Ref Range Status   Specimen Description BLOOD BLOOD RIGHT FOREARM  Final   Special Requests BOTTLES DRAWN AEROBIC AND ANAEROBIC 5 CC EA  Final   Culture   Final    NO GROWTH 3 DAYS Performed at [facility], [address], [place], [place] [address]    Report Status PENDING  Incomplete  Culture, blood (Routine X 2) w Reflex to ID Panel     Status: None (Preliminary result)   Collection Time: 04/22/16 10:48 AM  Result Value Ref Range Status   Specimen Description BLOOD LEFT ANTECUBITAL  Final  Special Requests BOTTLES DRAWN AEROBIC AND ANAEROBIC 5CC  Final   Culture   Final    NO GROWTH 3 DAYS Performed at [facility], [address], [place], [place] [address]    Report Status PENDING  Incomplete  Culture, Urine     Status: Abnormal   Collection Time: 04/23/16 11:34 AM  Result Value Ref Range Status   Specimen Description URINE, RANDOM  Final   Special Requests NONE  Final   Culture MULTIPLE SPECIES PRESENT, SUGGEST RECOLLECTION (A)  Final   Report Status 04/25/2016 FINAL  Final    Labs: CBC:  Recent Labs Lab 04/22/16 1021 04/23/16 0554 04/25/16 1358  WBC 8.7 7.4 9.2  NEUTROABS  --   --  5.9  HGB 12.2 12.2 12.7  HCT 36.8 37.8 38.5  MCV 80.7 83.6 83.0  PLT 256 287 281   Basic Metabolic Panel:  Recent Labs Lab 04/22/16 1021 04/23/16 0554 04/24/16 0618 04/25/16 1358  NA 134* 139 136 133*  K 3.5 4.0 3.2* 3.9  CL 104 107 104 97*  CO2 22 24 26 25   GLUCOSE 112* 86  88 103*  BUN 16 18 15 14   CREATININE 0.82 1.14* 1.06* 0.96  CALCIUM 9.8 9.4 9.3 9.4  MG  --   --   --  1.7   Liver Function Tests:  Recent Labs Lab 04/22/16 1021 04/23/16 0554  AST 19 25  ALT 10* 11*  ALKPHOS 74 56  BILITOT 0.3 1.4*  PROT 7.8 6.9  ALBUMIN 4.1 3.6   Time spent: 30 minutes  Signed:  Brigette Hopfer  [facility]  04/26/2016  , 12:24 PM

## 2016-04-27 LAB — CULTURE, BLOOD (ROUTINE X 2)
CULTURE: NO GROWTH
Culture: NO GROWTH

## 2016-04-27 LAB — URINE CULTURE

## 2016-04-29 ENCOUNTER — Encounter: Payer: Self-pay | Admitting: Internal Medicine

## 2016-04-29 ENCOUNTER — Non-Acute Institutional Stay (SKILLED_NURSING_FACILITY): Payer: Medicare Other | Admitting: Internal Medicine

## 2016-04-29 DIAGNOSIS — F0281 Dementia in other diseases classified elsewhere with behavioral disturbance: Secondary | ICD-10-CM

## 2016-04-29 DIAGNOSIS — D508 Other iron deficiency anemias: Secondary | ICD-10-CM | POA: Diagnosis not present

## 2016-04-29 DIAGNOSIS — E559 Vitamin D deficiency, unspecified: Secondary | ICD-10-CM | POA: Diagnosis not present

## 2016-04-29 DIAGNOSIS — E034 Atrophy of thyroid (acquired): Secondary | ICD-10-CM

## 2016-04-29 DIAGNOSIS — F99 Mental disorder, not otherwise specified: Secondary | ICD-10-CM

## 2016-04-29 DIAGNOSIS — F02818 Dementia in other diseases classified elsewhere, unspecified severity, with other behavioral disturbance: Secondary | ICD-10-CM

## 2016-04-29 DIAGNOSIS — G301 Alzheimer's disease with late onset: Secondary | ICD-10-CM

## 2016-04-29 DIAGNOSIS — K219 Gastro-esophageal reflux disease without esophagitis: Secondary | ICD-10-CM | POA: Diagnosis not present

## 2016-04-29 DIAGNOSIS — F5105 Insomnia due to other mental disorder: Secondary | ICD-10-CM

## 2016-04-29 DIAGNOSIS — F32A Depression, unspecified: Secondary | ICD-10-CM

## 2016-04-29 DIAGNOSIS — I10 Essential (primary) hypertension: Secondary | ICD-10-CM

## 2016-04-29 DIAGNOSIS — F329 Major depressive disorder, single episode, unspecified: Secondary | ICD-10-CM

## 2016-05-01 LAB — CBC AND DIFFERENTIAL
HCT: 39 % (ref 36–46)
Hemoglobin: 12.4 g/dL (ref 12.0–16.0)
PLATELETS: 239 10*3/uL (ref 150–399)
WBC: 11 10^3/mL

## 2016-05-01 LAB — BASIC METABOLIC PANEL
BUN: 12 mg/dL (ref 4–21)
CREATININE: 0.9 mg/dL (ref 0.5–1.1)
GLUCOSE: 97 mg/dL
Potassium: 3.7 mmol/L (ref 3.4–5.3)
Sodium: 140 mmol/L (ref 137–147)

## 2016-05-03 LAB — CBC AND DIFFERENTIAL
HCT: 35 % — AB (ref 36–46)
HEMOGLOBIN: 11.6 g/dL — AB (ref 12.0–16.0)
PLATELETS: 240 10*3/uL (ref 150–399)
WBC: 8.8 10*3/mL

## 2016-05-03 LAB — BASIC METABOLIC PANEL
BUN: 14 mg/dL (ref 4–21)
CREATININE: 1 mg/dL (ref 0.5–1.1)
Glucose: 97 mg/dL
POTASSIUM: 3.7 mmol/L (ref 3.4–5.3)
SODIUM: 137 mmol/L (ref 137–147)

## 2016-05-05 ENCOUNTER — Encounter: Payer: Self-pay | Admitting: Internal Medicine

## 2016-05-05 NOTE — Progress Notes (Signed)
: Provider:   Location:  [place] Room Number: 425D Place of Service:  SNF (31)  PCP: Sheela Stack, MD Patient Care Team: [name], MD as PCP - General (Endocrinology)  Extended Emergency Contact Information Primary Emergency Contact: Crist Infante [place] of [place] Mobile Phone: [phone] Relation: Daughter Secondary Emergency Contact: Totten,Charles Address: [address]          [place], [place] [address] Johnnette Litter of [place] Phone: [phone] Relation: Spouse     Allergies: Penicillins  Chief Complaint  Patient presents with  . New Admit To SNF    Admit to Facility    HPI: Patient is 83 y.o. female with hypertension, hyperlipidemia, underlying dementia  who was admitted to [facility] from 3/41-96 for metabolic encephalopathy and delirium. Pt had been  brought by her daughter from home due to sudden onset of delirium and agitation last night. Patient has underlying dementia, however has no behavioral disturbances and is generally quite calm and cooperative. Daughter stated that this happens usually when she gets an infection. Pt was treated with IV rocephin for 5 days for a possible UTI. She was found to have a B12 deficiency which was replaced. Pt was started on seroquel. In the end it was felt that pt was having a progression of her dementia and family was told that this may not improve. Pt was admitted to SNF from home on 3/30/.2018 for residential care. While at SNF pt will be followed for hypothyroidism, tx with synthroid, iron def anemia, tx with replacement and HTN, tx with diovan 320 mg daily.  Past Medical History:  Diagnosis Date  . Adenomatous colon polyp   . Dementia   . Depression with anxiety   . DUB (dysfunctional uterine bleeding)   . Elevated cholesterol   . Hyperlipidemia   . Hypertension   . Kidney stone   . Sleep apnea   . Thyroid disease    Hypothyroid    Past Surgical History:  Procedure  Laterality Date  . ABDOMINAL HYSTERECTOMY    . APPENDECTOMY    . CHOLECYSTECTOMY    . Colon tumor-benign    . kidney stones      Allergies as of 04/29/2016      Reactions   Penicillins Other (See Comments)   "didn't sit well" Has patient had a PCN reaction causing immediate rash, facial/tongue/throat swelling, SOB or lightheadedness with hypotension: No Has patient had a PCN reaction causing severe rash involving mucus membranes or skin necrosis: No Has patient had a PCN reaction that required hospitalization No Has patient had a PCN reaction occurring within the last 10 years: No If all of the above answers are "NO", then may proceed with Cephalosporin use.      Medication List       Accurate as of 04/29/16 11:59 PM. Always use your most recent med list.          esomeprazole 40 MG capsule Commonly known as:  NEXIUM Take 40 mg by mouth daily.   galantamine 16 MG 24 hr capsule Commonly known as:  RAZADYNE ER TAKE ONE CAPSULE BY MOUTH EVERY DAY WITH BREAKFAST   levothyroxine 100 MCG tablet Commonly known as:  SYNTHROID, LEVOTHROID Take 100 mcg by mouth daily.   nisoldipine 17 MG 24 hr tablet Commonly known as:  SULAR Take 17 mg by mouth daily.   NU-IRON 150 MG capsule Generic drug:  iron polysaccharides Take 150-300 mg by mouth 3 (three) times daily.  Take 1 capsule in the morning Take 2 capsules at lunch   PRISTIQ 50 MG 24 hr tablet Generic drug:  desvenlafaxine Take 50 mg by mouth daily.   QUEtiapine 25 MG tablet Commonly known as:  SEROQUEL Take 1 tablet (25 mg total) by mouth at bedtime.   traZODone 50 MG tablet Commonly known as:  DESYREL Take 25 mg by mouth at bedtime.   valsartan 320 MG tablet Commonly known as:  DIOVAN Take 320 mg by mouth daily.   vitamin B-12 1000 MCG tablet Commonly known as:  CYANOCOBALAMIN Take 1 tablet (1,000 mcg total) by mouth daily.   Vitamin D (Ergocalciferol) 50000 units Caps capsule Commonly known as:  DRISDOL Take  50,000 Units by mouth every 7 (seven) days. Tuesday       No orders of the defined types were placed in this encounter.   Immunization History  Administered Date(s) Administered  . Influenza-Unspecified 11/28/2012    Social History  Substance Use Topics  . Smoking status: Never Smoker  . Smokeless tobacco: Never Used  . Alcohol use No    Family history is   Family History  Problem Relation Age of Onset  . Diabetes Mother   . Heart disease Father   . Hypertension Brother   . Heart disease Brother   . Diabetes type II Brother   . Diabetes type II Sister   . Thyroid disease Child   . Diabetes Child       Review of Systems  DATA OBTAINED: from patient, nurse GENERAL:  no fevers, fatigue, appetite changes SKIN: No itching, or rash EYES: No eye pain, redness, discharge EARS: No earache, tinnitus, change in hearing NOSE: No congestion, drainage or bleeding  MOUTH/THROAT: No mouth or tooth pain, No sore throat RESPIRATORY: No cough, wheezing, SOB CARDIAC: No chest pain, palpitations, lower extremity edema  GI: No abdominal pain, No N/V/D or constipation, No heartburn or reflux  GU: No dysuria, frequency or urgency, or incontinence  MUSCULOSKELETAL: No unrelieved bone/joint pain NEUROLOGIC: No headache, dizziness or focal weakness PSYCHIATRIC: No c/o anxiety or sadness   Vitals:   04/29/16 1020  BP: 109/62  Pulse: 73  Resp: 19  Temp: 97.3 F (36.3 C)    SpO2 Readings from Last 1 Encounters:  04/26/16 96%   Body mass index is 24.4 kg/m.     Physical Exam  GENERAL APPEARANCE: Alert,  No acute distress.  SKIN: No diaphoresis rash HEAD: Normocephalic, atraumatic  EYES: Conjunctiva/lids clear. Pupils round, reactive. EOMs intact.  EARS: External exam WNL, canals clear. Hearing grossly normal.  NOSE: No deformity or discharge.  MOUTH/THROAT: Lips w/o lesions  RESPIRATORY: Breathing is even, unlabored. Lung sounds are clear   CARDIOVASCULAR: Heart RRR no  murmurs, rubs or gallops. No peripheral edema.   GASTROINTESTINAL: Abdomen is soft, non-tender, not distended w/ normal bowel sounds. GENITOURINARY: Bladder non tender, not distended  MUSCULOSKELETAL: No abnormal joints or musculature NEUROLOGIC:  Cranial nerves 2-12 grossly intact. Moves all extremities  PSYCHIATRIC: dementia, no behavioral issues  Patient Active Problem List   Diagnosis Date Noted  . Acute metabolic encephalopathy 87/56/4332  . Delirium 04/22/2016  . Leukocytosis 03/19/2015  . AKI (acute kidney injury) ([place]) 03/14/2015  . GERD (gastroesophageal reflux disease) 03/14/2015  . Diaper dermatitis 03/14/2015  . Syncope 03/08/2015  . Physical deconditioning 03/08/2015  . Alzheimer's dementia without behavioral disturbance 10/11/2014  . Iron deficiency anemia 10/26/2013  . Diarrhea 10/26/2013  . Near syncope 09/06/2013  . Hypothyroid 09/06/2013  .  Hyponatremia 02/13/2013  . Weakness 02/13/2013  . 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 Hypersomnia with sleep apnea, unspecified 11/17/2012  . DUB (dysfunctional uterine bleeding)   . Hypertension   . Dementia   . Elevated cholesterol   . Kidney stone       Labs reviewed: Basic Metabolic Panel:    Component Value Date/Time   NA 133 (L) 04/25/2016 1358   NA 133 (A) 04/25/2016   K 3.9 04/25/2016 1358   CL 97 (L) 04/25/2016 1358   CO2 25 04/25/2016 1358   GLUCOSE 103 (H) 04/25/2016 1358   BUN 14 04/25/2016 1358   BUN 14 04/25/2016   CREATININE 0.96 04/25/2016 1358   CALCIUM 9.4 04/25/2016 1358   PROT 6.9 04/23/2016 0554   PROT 6.8 04/28/2013 1021   ALBUMIN 3.6 04/23/2016 0554   ALBUMIN 4.4 04/28/2013 1021   AST 25 04/23/2016 0554   ALT 11 (L) 04/23/2016 0554   ALKPHOS 56 04/23/2016 0554   BILITOT 1.4 (H) 04/23/2016 0554   GFRNONAA 54 (L) 04/25/2016 1358   GFRAA >60 04/25/2016 1358     Recent Labs  04/23/16 0554 04/24/16 04/24/16 0618 04/25/16  04/25/16 1358  NA 139 136* 136 133* 133*  K 4.0 3.2* 3.2*  --  3.9  CL 107  --  104  --  97*  CO2 24  --  26  --  25  GLUCOSE 86  --  88  --  103*  BUN 18 15 15 14 14   CREATININE 1.14* 1.1 1.06* 1.0 0.96  CALCIUM 9.4  --  9.3  --  9.4  MG  --   --   --   --  1.7   Liver Function Tests:  Recent Labs  08/17/15 1621 04/22/16 04/22/16 1021 04/23/16 0554  AST 23 19 19 25   ALT 15 10 10* 11*  ALKPHOS 79 74 74 56  BILITOT 0.7  --  0.3 1.4*  PROT 8.8*  --  7.8 6.9  ALBUMIN 4.7  --  4.1 3.6   No results for input(s): LIPASE, AMYLASE in the last 8760 hours. No results for input(s): AMMONIA in the last 8760 hours. CBC:  Recent Labs  08/17/15 1621  04/22/16 1021 04/23/16 04/23/16 0554 04/25/16 04/25/16 1358  WBC 10.0  < > 8.7 7.4 7.4 9.2 9.2  NEUTROABS 6.2  --   --   --   --   --  5.9  HGB 13.4  < > 12.2 12.2 12.2  --  12.7  HCT 42.2  < > 36.8 38 37.8  --  38.5  MCV 79.8  --  80.7  --  83.6  --  83.0  PLT 271  < > 256 287 287  --  281  < > = values in this interval not displayed. Lipid No results for input(s): CHOL, HDL, LDLCALC, TRIG in the last 8760 hours.  Cardiac Enzymes: No results for input(s): CKTOTAL, CKMB, CKMBINDEX, TROPONINI in the last 8760 hours. BNP: No results for input(s): BNP in the last 8760 hours. No results found for: MICROALBUR No results found for: HGBA1C Lab Results  Component Value Date   TSH 3.075 04/22/2016   Lab Results  Component Value Date   VITAMINB12 243 04/22/2016   Lab Results  Component Value Date   FOLATE 11.0 03/08/2015   Lab Results  Component Value Date   IRON 11 (L) 03/08/2015   TIBC 241 (L) 03/08/2015   FERRITIN 222  03/08/2015    Imaging and Procedures obtained prior to SNF admission: Ct Head Wo Contrast  Result Date: 04/22/2016 CLINICAL DATA:  Altered mental status. EXAM: CT HEAD WITHOUT CONTRAST TECHNIQUE: Contiguous axial images were obtained from the base of the skull through the vertex without intravenous  contrast. COMPARISON:  08/17/2015 FINDINGS: Brain: There is atrophy and chronic small vessel disease changes. No acute intracranial abnormality. Specifically, no hemorrhage, hydrocephalus, mass lesion, acute infarction, or significant intracranial injury. Vascular: No hyperdense vessel or unexpected calcification. Skull: No acute calvarial abnormality. Sinuses/Orbits: Frothy material within the left sphenoid sinus. Paranasal sinuses and mastoids otherwise clear. Orbital soft tissues unremarkable. Other:  None IMPRESSION: No acute intracranial abnormality. Atrophy, chronic microvascular disease. Electronically Signed   By: Rolm Baptise M.D.   On: 04/22/2016 12:14   Dg Chest Port 1 View  Result Date: 04/22/2016 CLINICAL DATA:  Altered mental status.  Shortness of breath today. EXAM: PORTABLE CHEST 1 VIEW COMPARISON:  PA and lateral chest 03/08/2015. FINDINGS: Lungs are clear. Heart size is normal. No pneumothorax or pleural effusion. No focal bony abnormality. IMPRESSION: Negative chest. Electronically Signed   By: Inge Rise M.D.   On: 04/22/2016 10:44     Not all labs, radiology exams or other studies done during hospitalization come through on my EPIC note; however they are reviewed by me.    Assessment and Plan  DEMENTIA WITH BEHAVOIRAL DISORDER - plan to cont razadyne ER 16 mg daily along with seroquel 25 mg qHS  HYPOTHYROIDISM-plan to cont 100 mcg daily  HTN - controlled;plan to conrinue diovan 320 mg daily and sular 17 mg daily  DEPRESSION- plan to cont pristiq 50 mg o qHS  GERD - not stated as uncontrolled; plan to cont nexium 40 mg daily  ANEMIA, IRON DEF - plan to cont nu-iron 150 mg po q am and 2 po with lunch  VIT D DEFICIENCY - cont replacement 50,000 u weekly  INSOMNIA-plan to cont trazodone 25 mg qHS   Time spent > 45 min;> 50% of time with patient was spent reviewing records, labs, tests and studies, counseling and developing plan of care  Inocencio Homes, MD

## 2016-05-06 ENCOUNTER — Encounter: Payer: Self-pay | Admitting: Internal Medicine

## 2016-05-06 DIAGNOSIS — G4700 Insomnia, unspecified: Secondary | ICD-10-CM

## 2016-05-06 DIAGNOSIS — F32A Depression, unspecified: Secondary | ICD-10-CM

## 2016-05-06 DIAGNOSIS — E559 Vitamin D deficiency, unspecified: Secondary | ICD-10-CM

## 2016-05-06 DIAGNOSIS — F329 Major depressive disorder, single episode, unspecified: Secondary | ICD-10-CM | POA: Insufficient documentation

## 2016-05-06 HISTORY — DX: Insomnia, unspecified: G47.00

## 2016-05-06 HISTORY — DX: Vitamin D deficiency, unspecified: E55.9

## 2016-05-06 HISTORY — DX: Depression, unspecified: F32.A

## 2016-05-15 ENCOUNTER — Non-Acute Institutional Stay (SKILLED_NURSING_FACILITY): Payer: Medicare Other | Admitting: Internal Medicine

## 2016-05-15 ENCOUNTER — Encounter: Payer: Self-pay | Admitting: Internal Medicine

## 2016-05-15 DIAGNOSIS — F329 Major depressive disorder, single episode, unspecified: Secondary | ICD-10-CM | POA: Diagnosis not present

## 2016-05-15 DIAGNOSIS — I10 Essential (primary) hypertension: Secondary | ICD-10-CM

## 2016-05-15 DIAGNOSIS — E559 Vitamin D deficiency, unspecified: Secondary | ICD-10-CM | POA: Diagnosis not present

## 2016-05-15 DIAGNOSIS — K219 Gastro-esophageal reflux disease without esophagitis: Secondary | ICD-10-CM | POA: Diagnosis not present

## 2016-05-15 DIAGNOSIS — F32A Depression, unspecified: Secondary | ICD-10-CM

## 2016-05-15 DIAGNOSIS — D508 Other iron deficiency anemias: Secondary | ICD-10-CM | POA: Diagnosis not present

## 2016-05-15 DIAGNOSIS — F99 Mental disorder, not otherwise specified: Secondary | ICD-10-CM | POA: Diagnosis not present

## 2016-05-15 DIAGNOSIS — F5105 Insomnia due to other mental disorder: Secondary | ICD-10-CM

## 2016-05-15 DIAGNOSIS — E034 Atrophy of thyroid (acquired): Secondary | ICD-10-CM | POA: Diagnosis not present

## 2016-05-15 NOTE — Progress Notes (Signed)
Location:  [facility] Room Number: 161W Place of Service:  SNF (31) Noah Delaine. Sheppard Coil, MD  PCP: Sheela Stack, MD Patient Care Team: [name], MD as PCP - General (Endocrinology)  Extended Emergency Contact Information Primary Emergency Contact: Crist Infante [place] of [place] Mobile Phone: [phone] Relation: Daughter Secondary Emergency Contact: Inabinet,Charles Address: [address]          [place], [place] [address] Johnnette Litter of [place] Phone: [phone] Relation: Spouse  Allergies  Allergen Reactions  . Penicillins Other (See Comments)    "didn't sit well" Has patient had a PCN reaction causing immediate rash, facial/tongue/throat swelling, SOB or lightheadedness with hypotension: No Has patient had a PCN reaction causing severe rash involving mucus membranes or skin necrosis: No Has patient had a PCN reaction that required hospitalization No Has patient had a PCN reaction occurring within the last 10 years: No If all of the above answers are "NO", then may proceed with Cephalosporin use.    Chief Complaint  Patient presents with  . Discharge Note    Discharged from SNF    HPI:  83 y.o. female  with hypertension, hyperlipidemia, underlying dementia  who was admitted to [facility] from 6/57-84 for metabolic encephalopathy and delirium. Pt had been brought by her daughter from home due to sudden onset of delirium and agitation last night. Patient has underlying dementia, however has no behavioral disturbances and is generally quite calm and cooperative. Daughter stated that this happens usually when she gets an infection. Pt was treated with IV rocephin for 5 days for a possible UTI. She was found to have a B12 deficiency which was replaced. Pt was started on seroquel. In the end it was felt that pt was having a progression of her dementia and family was told that this may not improve. Pt was admitted to SNF from home on  3/30/.2018 and is now [place].    Past Medical History:  Diagnosis Date  . Adenomatous colon polyp   . Dementia   . Depression with anxiety   . DUB (dysfunctional uterine bleeding)   . Elevated cholesterol   . Hyperlipidemia   . Hypertension   . Kidney stone   . Sleep apnea   . Thyroid disease    Hypothyroid    Past Surgical History:  Procedure Laterality Date  . ABDOMINAL HYSTERECTOMY    . APPENDECTOMY    . CHOLECYSTECTOMY    . Colon tumor-benign    . kidney stones       reports that she has never smoked. She has never used smokeless tobacco. She reports that she does not drink alcohol or use drugs. Social History   Social History  . Marital status: Married    Spouse name: [name]  . Number of children: 4  . Years of education: 8   Occupational History  .  Retired   Social History Main Topics  . Smoking status: Never Smoker  . Smokeless tobacco: Never Used  . Alcohol use No  . Drug use: No  . Sexual activity: No   Other Topics Concern  . Not on file   Social History Narrative   Patient is married [name]).   Patient has four children.   Patient has an 8th grade education.   Patient is retired.   Patient is right-handed.   Patient drinks 3- 2 liters of Pepsi daily.    Pertinent  Health Maintenance Due  Topic  Date Due  . PNA vac Low Risk Adult (1 of 2 - PCV13) 04/09/1999  . PAP SMEAR  05/09/2012  . INFLUENZA VACCINE  08/28/2016  . DEXA SCAN  Completed    Medications: Allergies as of 05/15/2016      Reactions   Penicillins Other (See Comments)   "didn't sit well" Has patient had a PCN reaction causing immediate rash, facial/tongue/throat swelling, SOB or lightheadedness with hypotension: No Has patient had a PCN reaction causing severe rash involving mucus membranes or skin necrosis: No Has patient had a PCN reaction that required hospitalization No Has patient had a PCN reaction occurring within the last 10 years: No If all of  the above answers are "NO", then may proceed with Cephalosporin use.      Medication List       Accurate as of 05/15/16  2:43 PM. Always use your most recent med list.          ENSURE ENLIVE PO Take 237 mLs by mouth 2 (two) times daily.   esomeprazole 40 MG capsule Commonly known as:  NEXIUM Take 40 mg by mouth daily.   galantamine 16 MG 24 hr capsule Commonly known as:  RAZADYNE ER TAKE ONE CAPSULE BY MOUTH EVERY DAY WITH BREAKFAST   levothyroxine 100 MCG tablet Commonly known as:  SYNTHROID, LEVOTHROID Take 100 mcg by mouth daily.   nisoldipine 17 MG 24 hr tablet Commonly known as:  SULAR Take 17 mg by mouth daily.   NU-IRON 150 MG capsule Generic drug:  iron polysaccharides Take 150-300 mg by mouth See admin instructions. Take 1 capsule in the morning Take 2 capsules at lunch   PRISTIQ 50 MG 24 hr tablet Generic drug:  desvenlafaxine Take 50 mg by mouth daily.   traZODone 50 MG tablet Commonly known as:  DESYREL Take 25 mg by mouth at bedtime.   valsartan 320 MG tablet Commonly known as:  DIOVAN Take 320 mg by mouth daily.   Vitamin D (Ergocalciferol) 50000 units Caps capsule Commonly known as:  DRISDOL Take 50,000 Units by mouth every 7 (seven) days. Tuesday        Vitals:   05/15/16 1241  BP: 134/66  Pulse: (!) 58  Resp: 17  Temp: 97.1 F (36.2 C)  Weight: 118 lb (53.5 kg)  Height: 4\' 11"  (1.499 m)   Body mass index is 23.83 kg/m.  Physical Exam  GENERAL APPEARANCE: Alert, No acute distress.  HEENT: Unremarkable. RESPIRATORY: Breathing is even, unlabored. Lung sounds are clear   CARDIOVASCULAR: Heart RRR no murmurs, rubs or gallops. No peripheral edema.  GASTROINTESTINAL: Abdomen is soft, non-tender, not distended w/ normal bowel sounds.  NEUROLOGIC: Cranial nerves 2-12 grossly intact. Moves all extremities   Labs reviewed: Basic Metabolic Panel:  Recent Labs  04/23/16 0554  04/24/16 0618  04/25/16 1358 05/01/16 05/03/16  NA 139   < > 136  < > 133* 140 137  K 4.0  < > 3.2*  --  3.9 3.7 3.7  CL 107  --  104  --  97*  --   --   CO2 24  --  26  --  25  --   --   GLUCOSE 86  --  88  --  103*  --   --   BUN 18  < > 15  < > 14 12 14   CREATININE 1.14*  < > 1.06*  < > 0.96 0.9 1.0  CALCIUM 9.4  --  9.3  --  9.4  --   --   MG  --   --   --   --  1.7  --   --   < > = values in this interval not displayed. No results found for: [facility] Liver Function Tests:  Recent Labs  08/17/15 1621 04/22/16 04/22/16 1021 04/23/16 0554  AST 23 19 19 25   ALT 15 10 10* 11*  ALKPHOS 79 74 74 56  BILITOT 0.7  --  0.3 1.4*  PROT 8.8*  --  7.8 6.9  ALBUMIN 4.7  --  4.1 3.6   No results for input(s): LIPASE, AMYLASE in the last 8760 hours. No results for input(s): AMMONIA in the last 8760 hours. CBC:  Recent Labs  08/17/15 1621  04/22/16 1021  04/23/16 0554  04/25/16 1358 05/01/16 05/03/16  WBC 10.0  < > 8.7  < > 7.4  < > 9.2 11.0 8.8  NEUTROABS 6.2  --   --   --   --   --  5.9  --   --   HGB 13.4  < > 12.2  < > 12.2  --  12.7 12.4 11.6*  HCT 42.2  < > 36.8  < > 37.8  --  38.5 39 35*  MCV 79.8  --  80.7  --  83.6  --  83.0  --   --   PLT 271  < > 256  < > 287  --  281 239 240  < > = values in this interval not displayed. Lipid No results for input(s): CHOL, HDL, LDLCALC, TRIG in the last 8760 hours. Cardiac Enzymes: No results for input(s): CKTOTAL, CKMB, CKMBINDEX, TROPONINI in the last 8760 hours. BNP: No results for input(s): BNP in the last 8760 hours. CBG: No results for input(s): GLUCAP in the last 8760 hours.  Procedures and Imaging Studies During Stay: Ct Head Wo Contrast  Result Date: 04/22/2016 CLINICAL DATA:  Altered mental status. EXAM: CT HEAD WITHOUT CONTRAST TECHNIQUE: Contiguous axial images were obtained from the base of the skull through the vertex without intravenous contrast. COMPARISON:  08/17/2015 FINDINGS: Brain: There is atrophy and chronic small vessel disease changes. No acute intracranial  abnormality. Specifically, no hemorrhage, hydrocephalus, mass lesion, acute infarction, or significant intracranial injury. Vascular: No hyperdense vessel or unexpected calcification. Skull: No acute calvarial abnormality. Sinuses/Orbits: Frothy material within the left sphenoid sinus. Paranasal sinuses and mastoids otherwise clear. Orbital soft tissues unremarkable. Other:  None IMPRESSION: No acute intracranial abnormality. Atrophy, chronic microvascular disease. Electronically Signed   By: Rolm Baptise M.D.   On: 04/22/2016 12:14   Mr Brain Wo Contrast  Result Date: 04/24/2016 CLINICAL DATA:  Acute encephalopathy.  History of dementia. EXAM: MRI HEAD WITHOUT CONTRAST TECHNIQUE: Multiplanar, multiecho pulse sequences of the brain and surrounding structures were obtained without intravenous contrast. COMPARISON:  Head CT 04/22/2016 and MRI 06/15/2015 FINDINGS: Brain: There is no evidence of acute infarct, intracranial hemorrhage, mass, midline shift, or extra-axial fluid collection. There is moderate cerebral atrophy, progressed from 2007. Patchy cerebral white matter T2 hyperintensities have progressed from 2007 and are nonspecific but compatible with chronic small vessel ischemic disease, mild-to-moderate for age. Vascular: Major intracranial vascular flow voids are preserved. Skull and upper cervical spine: No suspicious marrow lesion. Advanced C3-4 disc degeneration. Sinuses/Orbits: Unremarkable orbits. Left sphenoid sinus bubbly secretions with near complete sinus opacification. Mild right ethmoid air cell mucosal thickening. Clear mastoid air cells. Other: None. IMPRESSION: 1. No acute intracranial abnormality. 2. Moderate cerebral  atrophy and chronic small vessel ischemic disease. Electronically Signed   By: Logan Bores M.D.   On: 04/24/2016 13:11   Dg Chest Port 1 View  Result Date: 04/22/2016 CLINICAL DATA:  Altered mental status.  Shortness of breath today. EXAM: PORTABLE CHEST 1 VIEW COMPARISON:   PA and lateral chest 03/08/2015. FINDINGS: Lungs are clear. Heart size is normal. No pneumothorax or pleural effusion. No focal bony abnormality. IMPRESSION: Negative chest. Electronically Signed   By: Inge Rise M.D.   On: 04/22/2016 10:44    Assessment/Plan:   Depression, unspecified depression type  Hypothyroidism due to acquired atrophy of thyroid  Essential hypertension  Gastroesophageal reflux disease without esophagitis  Iron deficiency anemia secondary to inadequate dietary iron intake  Insomnia due to other mental disorder  Vitamin D deficiency   Patient is being discharged with the following home health services:  oT/pt/nURSING  Patient is being discharged with the following durable medical equipment:  NONE  Patient has been advised to f/u with their PCP in 1-2 weeks to bring them up to date on their rehab stay.  Social services at facility was responsible for arranging this appointment.  Pt was provided with a 30 day supply of prescriptions for medications and refills must be obtained from their PCP.  For controlled substances, a more limited supply may be provided adequate until PCP appointment only.  MEDICATIONS HAVE BEEN RECONCILED   Time spent >30 min;> 50% of time with patient was spent reviewing records, labs, tests and studies, counseling and developing plan of care  Noah Delaine. Sheppard Coil, MD

## 2016-05-18 DIAGNOSIS — F418 Other specified anxiety disorders: Secondary | ICD-10-CM | POA: Diagnosis not present

## 2016-05-18 DIAGNOSIS — I10 Essential (primary) hypertension: Secondary | ICD-10-CM | POA: Diagnosis not present

## 2016-05-18 DIAGNOSIS — G9341 Metabolic encephalopathy: Secondary | ICD-10-CM | POA: Diagnosis not present

## 2016-05-18 DIAGNOSIS — M6281 Muscle weakness (generalized): Secondary | ICD-10-CM | POA: Diagnosis not present

## 2016-05-18 DIAGNOSIS — F0390 Unspecified dementia without behavioral disturbance: Secondary | ICD-10-CM | POA: Diagnosis not present

## 2016-05-20 DIAGNOSIS — Z6824 Body mass index (BMI) 24.0-24.9, adult: Secondary | ICD-10-CM | POA: Diagnosis not present

## 2016-05-20 DIAGNOSIS — R269 Unspecified abnormalities of gait and mobility: Secondary | ICD-10-CM | POA: Diagnosis not present

## 2016-05-20 DIAGNOSIS — D510 Vitamin B12 deficiency anemia due to intrinsic factor deficiency: Secondary | ICD-10-CM | POA: Diagnosis not present

## 2016-05-20 DIAGNOSIS — E784 Other hyperlipidemia: Secondary | ICD-10-CM | POA: Diagnosis not present

## 2016-05-20 DIAGNOSIS — E871 Hypo-osmolality and hyponatremia: Secondary | ICD-10-CM | POA: Diagnosis not present

## 2016-05-20 DIAGNOSIS — I10 Essential (primary) hypertension: Secondary | ICD-10-CM | POA: Diagnosis not present

## 2016-05-20 DIAGNOSIS — K219 Gastro-esophageal reflux disease without esophagitis: Secondary | ICD-10-CM | POA: Diagnosis not present

## 2016-05-20 DIAGNOSIS — R634 Abnormal weight loss: Secondary | ICD-10-CM | POA: Diagnosis not present

## 2016-05-20 DIAGNOSIS — Z1389 Encounter for screening for other disorder: Secondary | ICD-10-CM | POA: Diagnosis not present

## 2016-05-20 DIAGNOSIS — E038 Other specified hypothyroidism: Secondary | ICD-10-CM | POA: Diagnosis not present

## 2016-05-20 DIAGNOSIS — G309 Alzheimer's disease, unspecified: Secondary | ICD-10-CM | POA: Diagnosis not present

## 2016-05-20 DIAGNOSIS — F329 Major depressive disorder, single episode, unspecified: Secondary | ICD-10-CM | POA: Diagnosis not present

## 2016-05-21 DIAGNOSIS — F0390 Unspecified dementia without behavioral disturbance: Secondary | ICD-10-CM | POA: Diagnosis not present

## 2016-05-21 DIAGNOSIS — I10 Essential (primary) hypertension: Secondary | ICD-10-CM | POA: Diagnosis not present

## 2016-05-21 DIAGNOSIS — F418 Other specified anxiety disorders: Secondary | ICD-10-CM | POA: Diagnosis not present

## 2016-05-21 DIAGNOSIS — M6281 Muscle weakness (generalized): Secondary | ICD-10-CM | POA: Diagnosis not present

## 2016-05-21 DIAGNOSIS — G9341 Metabolic encephalopathy: Secondary | ICD-10-CM | POA: Diagnosis not present

## 2016-05-24 DIAGNOSIS — G9341 Metabolic encephalopathy: Secondary | ICD-10-CM | POA: Diagnosis not present

## 2016-05-24 DIAGNOSIS — F0390 Unspecified dementia without behavioral disturbance: Secondary | ICD-10-CM | POA: Diagnosis not present

## 2016-05-24 DIAGNOSIS — M6281 Muscle weakness (generalized): Secondary | ICD-10-CM | POA: Diagnosis not present

## 2016-05-24 DIAGNOSIS — I10 Essential (primary) hypertension: Secondary | ICD-10-CM | POA: Diagnosis not present

## 2016-05-24 DIAGNOSIS — F418 Other specified anxiety disorders: Secondary | ICD-10-CM | POA: Diagnosis not present

## 2016-05-28 DIAGNOSIS — M6281 Muscle weakness (generalized): Secondary | ICD-10-CM | POA: Diagnosis not present

## 2016-05-28 DIAGNOSIS — G9341 Metabolic encephalopathy: Secondary | ICD-10-CM | POA: Diagnosis not present

## 2016-05-28 DIAGNOSIS — I10 Essential (primary) hypertension: Secondary | ICD-10-CM | POA: Diagnosis not present

## 2016-05-28 DIAGNOSIS — F418 Other specified anxiety disorders: Secondary | ICD-10-CM | POA: Diagnosis not present

## 2016-05-28 DIAGNOSIS — F0390 Unspecified dementia without behavioral disturbance: Secondary | ICD-10-CM | POA: Diagnosis not present

## 2016-05-29 DIAGNOSIS — M6281 Muscle weakness (generalized): Secondary | ICD-10-CM | POA: Diagnosis not present

## 2016-05-29 DIAGNOSIS — F0390 Unspecified dementia without behavioral disturbance: Secondary | ICD-10-CM | POA: Diagnosis not present

## 2016-05-29 DIAGNOSIS — F418 Other specified anxiety disorders: Secondary | ICD-10-CM | POA: Diagnosis not present

## 2016-05-29 DIAGNOSIS — I10 Essential (primary) hypertension: Secondary | ICD-10-CM | POA: Diagnosis not present

## 2016-05-29 DIAGNOSIS — G9341 Metabolic encephalopathy: Secondary | ICD-10-CM | POA: Diagnosis not present

## 2016-05-30 DIAGNOSIS — F418 Other specified anxiety disorders: Secondary | ICD-10-CM | POA: Diagnosis not present

## 2016-05-30 DIAGNOSIS — F0390 Unspecified dementia without behavioral disturbance: Secondary | ICD-10-CM | POA: Diagnosis not present

## 2016-05-30 DIAGNOSIS — I10 Essential (primary) hypertension: Secondary | ICD-10-CM | POA: Diagnosis not present

## 2016-05-30 DIAGNOSIS — G9341 Metabolic encephalopathy: Secondary | ICD-10-CM | POA: Diagnosis not present

## 2016-05-30 DIAGNOSIS — M6281 Muscle weakness (generalized): Secondary | ICD-10-CM | POA: Diagnosis not present

## 2016-05-31 DIAGNOSIS — G9341 Metabolic encephalopathy: Secondary | ICD-10-CM | POA: Diagnosis not present

## 2016-05-31 DIAGNOSIS — F418 Other specified anxiety disorders: Secondary | ICD-10-CM | POA: Diagnosis not present

## 2016-05-31 DIAGNOSIS — M6281 Muscle weakness (generalized): Secondary | ICD-10-CM | POA: Diagnosis not present

## 2016-05-31 DIAGNOSIS — F0390 Unspecified dementia without behavioral disturbance: Secondary | ICD-10-CM | POA: Diagnosis not present

## 2016-05-31 DIAGNOSIS — I10 Essential (primary) hypertension: Secondary | ICD-10-CM | POA: Diagnosis not present

## 2016-06-03 DIAGNOSIS — F0390 Unspecified dementia without behavioral disturbance: Secondary | ICD-10-CM | POA: Diagnosis not present

## 2016-06-03 DIAGNOSIS — I10 Essential (primary) hypertension: Secondary | ICD-10-CM | POA: Diagnosis not present

## 2016-06-03 DIAGNOSIS — F418 Other specified anxiety disorders: Secondary | ICD-10-CM | POA: Diagnosis not present

## 2016-06-03 DIAGNOSIS — G9341 Metabolic encephalopathy: Secondary | ICD-10-CM | POA: Diagnosis not present

## 2016-06-03 DIAGNOSIS — M6281 Muscle weakness (generalized): Secondary | ICD-10-CM | POA: Diagnosis not present

## 2016-06-04 DIAGNOSIS — F0390 Unspecified dementia without behavioral disturbance: Secondary | ICD-10-CM | POA: Diagnosis not present

## 2016-06-04 DIAGNOSIS — F418 Other specified anxiety disorders: Secondary | ICD-10-CM | POA: Diagnosis not present

## 2016-06-04 DIAGNOSIS — I10 Essential (primary) hypertension: Secondary | ICD-10-CM | POA: Diagnosis not present

## 2016-06-04 DIAGNOSIS — G9341 Metabolic encephalopathy: Secondary | ICD-10-CM | POA: Diagnosis not present

## 2016-06-04 DIAGNOSIS — M6281 Muscle weakness (generalized): Secondary | ICD-10-CM | POA: Diagnosis not present

## 2016-06-06 DIAGNOSIS — M6281 Muscle weakness (generalized): Secondary | ICD-10-CM | POA: Diagnosis not present

## 2016-06-06 DIAGNOSIS — F0390 Unspecified dementia without behavioral disturbance: Secondary | ICD-10-CM | POA: Diagnosis not present

## 2016-06-06 DIAGNOSIS — F418 Other specified anxiety disorders: Secondary | ICD-10-CM | POA: Diagnosis not present

## 2016-06-06 DIAGNOSIS — G9341 Metabolic encephalopathy: Secondary | ICD-10-CM | POA: Diagnosis not present

## 2016-06-06 DIAGNOSIS — I10 Essential (primary) hypertension: Secondary | ICD-10-CM | POA: Diagnosis not present

## 2016-06-10 DIAGNOSIS — F0390 Unspecified dementia without behavioral disturbance: Secondary | ICD-10-CM | POA: Diagnosis not present

## 2016-06-10 DIAGNOSIS — G9341 Metabolic encephalopathy: Secondary | ICD-10-CM | POA: Diagnosis not present

## 2016-06-10 DIAGNOSIS — M6281 Muscle weakness (generalized): Secondary | ICD-10-CM | POA: Diagnosis not present

## 2016-06-10 DIAGNOSIS — F418 Other specified anxiety disorders: Secondary | ICD-10-CM | POA: Diagnosis not present

## 2016-06-10 DIAGNOSIS — I10 Essential (primary) hypertension: Secondary | ICD-10-CM | POA: Diagnosis not present

## 2016-06-12 DIAGNOSIS — G9341 Metabolic encephalopathy: Secondary | ICD-10-CM | POA: Diagnosis not present

## 2016-06-12 DIAGNOSIS — I10 Essential (primary) hypertension: Secondary | ICD-10-CM | POA: Diagnosis not present

## 2016-06-12 DIAGNOSIS — F0390 Unspecified dementia without behavioral disturbance: Secondary | ICD-10-CM | POA: Diagnosis not present

## 2016-06-12 DIAGNOSIS — F418 Other specified anxiety disorders: Secondary | ICD-10-CM | POA: Diagnosis not present

## 2016-06-12 DIAGNOSIS — M6281 Muscle weakness (generalized): Secondary | ICD-10-CM | POA: Diagnosis not present

## 2016-06-18 DIAGNOSIS — M6281 Muscle weakness (generalized): Secondary | ICD-10-CM | POA: Diagnosis not present

## 2016-06-18 DIAGNOSIS — F418 Other specified anxiety disorders: Secondary | ICD-10-CM | POA: Diagnosis not present

## 2016-06-18 DIAGNOSIS — I10 Essential (primary) hypertension: Secondary | ICD-10-CM | POA: Diagnosis not present

## 2016-06-18 DIAGNOSIS — G9341 Metabolic encephalopathy: Secondary | ICD-10-CM | POA: Diagnosis not present

## 2016-06-18 DIAGNOSIS — F0390 Unspecified dementia without behavioral disturbance: Secondary | ICD-10-CM | POA: Diagnosis not present

## 2016-06-20 DIAGNOSIS — F0390 Unspecified dementia without behavioral disturbance: Secondary | ICD-10-CM | POA: Diagnosis not present

## 2016-06-20 DIAGNOSIS — F418 Other specified anxiety disorders: Secondary | ICD-10-CM | POA: Diagnosis not present

## 2016-06-20 DIAGNOSIS — M6281 Muscle weakness (generalized): Secondary | ICD-10-CM | POA: Diagnosis not present

## 2016-06-20 DIAGNOSIS — G9341 Metabolic encephalopathy: Secondary | ICD-10-CM | POA: Diagnosis not present

## 2016-06-20 DIAGNOSIS — I10 Essential (primary) hypertension: Secondary | ICD-10-CM | POA: Diagnosis not present

## 2016-06-24 ENCOUNTER — Encounter (HOSPITAL_COMMUNITY): Payer: Self-pay | Admitting: Emergency Medicine

## 2016-06-24 ENCOUNTER — Observation Stay (HOSPITAL_COMMUNITY): Payer: Medicare Other

## 2016-06-24 ENCOUNTER — Observation Stay (HOSPITAL_COMMUNITY)
Admission: EM | Admit: 2016-06-24 | Discharge: 2016-06-25 | Disposition: A | Discharge status: Home Health | Payer: Medicare Other | Attending: Internal Medicine | Admitting: Internal Medicine

## 2016-06-24 DIAGNOSIS — G4730 Sleep apnea, unspecified: Secondary | ICD-10-CM | POA: Diagnosis not present

## 2016-06-24 DIAGNOSIS — R410 Disorientation, unspecified: Secondary | ICD-10-CM

## 2016-06-24 DIAGNOSIS — L8990 Pressure ulcer of unspecified site, unspecified stage: Secondary | ICD-10-CM | POA: Insufficient documentation

## 2016-06-24 DIAGNOSIS — E876 Hypokalemia: Secondary | ICD-10-CM

## 2016-06-24 DIAGNOSIS — I10 Essential (primary) hypertension: Secondary | ICD-10-CM | POA: Insufficient documentation

## 2016-06-24 DIAGNOSIS — F23 Brief psychotic disorder: Secondary | ICD-10-CM | POA: Diagnosis not present

## 2016-06-24 DIAGNOSIS — Z79899 Other long term (current) drug therapy: Secondary | ICD-10-CM | POA: Diagnosis not present

## 2016-06-24 DIAGNOSIS — R0602 Shortness of breath: Secondary | ICD-10-CM | POA: Diagnosis not present

## 2016-06-24 DIAGNOSIS — E039 Hypothyroidism, unspecified: Secondary | ICD-10-CM | POA: Insufficient documentation

## 2016-06-24 DIAGNOSIS — D509 Iron deficiency anemia, unspecified: Secondary | ICD-10-CM | POA: Diagnosis not present

## 2016-06-24 DIAGNOSIS — E559 Vitamin D deficiency, unspecified: Secondary | ICD-10-CM | POA: Insufficient documentation

## 2016-06-24 DIAGNOSIS — G9341 Metabolic encephalopathy: Principal | ICD-10-CM

## 2016-06-24 DIAGNOSIS — K219 Gastro-esophageal reflux disease without esophagitis: Secondary | ICD-10-CM | POA: Diagnosis not present

## 2016-06-24 DIAGNOSIS — F418 Other specified anxiety disorders: Secondary | ICD-10-CM | POA: Diagnosis not present

## 2016-06-24 DIAGNOSIS — F29 Unspecified psychosis not due to a substance or known physiological condition: Secondary | ICD-10-CM | POA: Diagnosis not present

## 2016-06-24 DIAGNOSIS — R5381 Other malaise: Secondary | ICD-10-CM | POA: Diagnosis present

## 2016-06-24 DIAGNOSIS — R54 Age-related physical debility: Secondary | ICD-10-CM | POA: Insufficient documentation

## 2016-06-24 DIAGNOSIS — G309 Alzheimer's disease, unspecified: Secondary | ICD-10-CM | POA: Insufficient documentation

## 2016-06-24 DIAGNOSIS — R404 Transient alteration of awareness: Secondary | ICD-10-CM | POA: Diagnosis not present

## 2016-06-24 DIAGNOSIS — F918 Other conduct disorders: Secondary | ICD-10-CM | POA: Diagnosis not present

## 2016-06-24 DIAGNOSIS — F0281 Dementia in other diseases classified elsewhere with behavioral disturbance: Secondary | ICD-10-CM | POA: Diagnosis present

## 2016-06-24 DIAGNOSIS — R4689 Other symptoms and signs involving appearance and behavior: Secondary | ICD-10-CM

## 2016-06-24 DIAGNOSIS — R262 Difficulty in walking, not elsewhere classified: Secondary | ICD-10-CM | POA: Insufficient documentation

## 2016-06-24 DIAGNOSIS — F0280 Dementia in other diseases classified elsewhere without behavioral disturbance: Secondary | ICD-10-CM | POA: Insufficient documentation

## 2016-06-24 LAB — I-STAT CHEM 8, ED
BUN: 11 mg/dL (ref 6–20)
BUN: 12 mg/dL (ref 6–20)
CALCIUM ION: 1.17 mmol/L (ref 1.15–1.40)
CALCIUM ION: 1.18 mmol/L (ref 1.15–1.40)
CHLORIDE: 98 mmol/L — AB (ref 101–111)
CREATININE: 0.9 mg/dL (ref 0.44–1.00)
Chloride: 98 mmol/L — ABNORMAL LOW (ref 101–111)
Creatinine, Ser: 0.9 mg/dL (ref 0.44–1.00)
GLUCOSE: 95 mg/dL (ref 65–99)
Glucose, Bld: 101 mg/dL — ABNORMAL HIGH (ref 65–99)
HCT: 39 % (ref 36.0–46.0)
HEMATOCRIT: 39 % (ref 36.0–46.0)
HEMOGLOBIN: 13.3 g/dL (ref 12.0–15.0)
Hemoglobin: 13.3 g/dL (ref 12.0–15.0)
POTASSIUM: 2.2 mmol/L — AB (ref 3.5–5.1)
POTASSIUM: 2.2 mmol/L — AB (ref 3.5–5.1)
SODIUM: 137 mmol/L (ref 135–145)
SODIUM: 137 mmol/L (ref 135–145)
TCO2: 27 mmol/L (ref 0–100)
TCO2: 29 mmol/L (ref 0–100)

## 2016-06-24 LAB — MAGNESIUM: MAGNESIUM: 1.9 mg/dL (ref 1.7–2.4)

## 2016-06-24 LAB — URINALYSIS, ROUTINE W REFLEX MICROSCOPIC
BILIRUBIN URINE: NEGATIVE
Glucose, UA: NEGATIVE mg/dL
Hgb urine dipstick: NEGATIVE
Ketones, ur: NEGATIVE mg/dL
Leukocytes, UA: NEGATIVE
NITRITE: NEGATIVE
PROTEIN: NEGATIVE mg/dL
SPECIFIC GRAVITY, URINE: 1.002 — AB (ref 1.005–1.030)
pH: 6 (ref 5.0–8.0)

## 2016-06-24 LAB — BASIC METABOLIC PANEL
Anion gap: 10 (ref 5–15)
BUN: 12 mg/dL (ref 6–20)
CHLORIDE: 99 mmol/L — AB (ref 101–111)
CO2: 26 mmol/L (ref 22–32)
CREATININE: 0.97 mg/dL (ref 0.44–1.00)
Calcium: 9.4 mg/dL (ref 8.9–10.3)
GFR, EST NON AFRICAN AMERICAN: 53 mL/min — AB (ref >60)
Glucose, Bld: 105 mg/dL — ABNORMAL HIGH (ref 65–99)
Potassium: 2.2 mmol/L — CL (ref 3.5–5.1)
SODIUM: 135 mmol/L (ref 135–145)

## 2016-06-24 LAB — TSH: TSH: 2.465 u[IU]/mL (ref 0.350–4.500)

## 2016-06-24 LAB — CBC WITH DIFFERENTIAL/PLATELET
BASOS PCT: 0 %
Basophils Absolute: 0 10*3/uL (ref 0.0–0.1)
EOS ABS: 0.1 10*3/uL (ref 0.0–0.7)
Eosinophils Relative: 1 %
HCT: 36.9 % (ref 36.0–46.0)
HEMOGLOBIN: 12.5 g/dL (ref 12.0–15.0)
Lymphocytes Relative: 30 %
Lymphs Abs: 2.4 10*3/uL (ref 0.7–4.0)
MCH: 27.4 pg (ref 26.0–34.0)
MCHC: 33.9 g/dL (ref 30.0–36.0)
MCV: 80.9 fL (ref 78.0–100.0)
Monocytes Absolute: 1 10*3/uL (ref 0.1–1.0)
Monocytes Relative: 12 %
NEUTROS PCT: 57 %
Neutro Abs: 4.5 10*3/uL (ref 1.7–7.7)
PLATELETS: 248 10*3/uL (ref 150–400)
RBC: 4.56 MIL/uL (ref 3.87–5.11)
RDW: 14 % (ref 11.5–15.5)
WBC: 8 10*3/uL (ref 4.0–10.5)

## 2016-06-24 LAB — POTASSIUM: POTASSIUM: 3.2 mmol/L — AB (ref 3.5–5.1)

## 2016-06-24 MED ORDER — ALBUTEROL SULFATE (2.5 MG/3ML) 0.083% IN NEBU
2.5000 mg | INHALATION_SOLUTION | RESPIRATORY_TRACT | Status: DC | PRN
Start: 2016-06-24 — End: 2016-06-25

## 2016-06-24 MED ORDER — ACETAMINOPHEN 650 MG RE SUPP: 650.0000 mg | Freq: Four times a day (QID) | RECTAL | Status: DC | PRN

## 2016-06-24 MED ORDER — ACETAMINOPHEN 325 MG PO TABS: 650.0000 mg | ORAL_TABLET | Freq: Four times a day (QID) | ORAL | Status: DC | PRN

## 2016-06-24 MED ORDER — PANTOPRAZOLE SODIUM 40 MG PO TBEC
40.0000 mg | DELAYED_RELEASE_TABLET | Freq: Every day | ORAL | Status: DC
  Administered 2016-06-25: 40 mg via ORAL
  Filled 2016-06-24 (×2): qty 1

## 2016-06-24 MED ORDER — POTASSIUM CHLORIDE 10 MEQ/100ML IV SOLN
10.0000 meq | INTRAVENOUS | Status: DC
  Administered 2016-06-24: 10 meq via INTRAVENOUS
  Filled 2016-06-24: qty 100

## 2016-06-24 MED ORDER — SODIUM CHLORIDE 0.9% FLUSH
3.0000 mL | Freq: Two times a day (BID) | INTRAVENOUS | Status: DC
  Administered 2016-06-24: 3 mL via INTRAVENOUS

## 2016-06-24 MED ORDER — ONDANSETRON HCL 4 MG/2ML IJ SOLN: 4.0000 mg | Freq: Four times a day (QID) | INTRAMUSCULAR | Status: DC | PRN

## 2016-06-24 MED ORDER — NISOLDIPINE ER 17 MG PO TB24
17.0000 mg | ORAL_TABLET | Freq: Every day | ORAL | Status: DC
  Administered 2016-06-25: 17 mg via ORAL
  Filled 2016-06-24 (×2): qty 1

## 2016-06-24 MED ORDER — MAGNESIUM SULFATE IN D5W 1-5 GM/100ML-% IV SOLN
1.0000 g | Freq: Once | INTRAVENOUS | Status: AC
  Administered 2016-06-24: 1 g via INTRAVENOUS
  Filled 2016-06-24: qty 100

## 2016-06-24 MED ORDER — LEVOTHYROXINE SODIUM 100 MCG PO TABS
100.0000 ug | ORAL_TABLET | Freq: Every day | ORAL | Status: DC
  Administered 2016-06-25: 100 ug via ORAL
  Filled 2016-06-24 (×3): qty 1

## 2016-06-24 MED ORDER — HEPARIN SODIUM (PORCINE) 5000 UNIT/ML IJ SOLN
5000.0000 [IU] | Freq: Three times a day (TID) | INTRAMUSCULAR | Status: DC
  Administered 2016-06-24 – 2016-06-25 (×4): 5000 [IU] via SUBCUTANEOUS
  Filled 2016-06-24 (×4): qty 1

## 2016-06-24 MED ORDER — SODIUM CHLORIDE 0.9 % IV SOLN
INTRAVENOUS | Status: AC
  Administered 2016-06-24 (×2): via INTRAVENOUS
  Filled 2016-06-24 (×3): qty 1000

## 2016-06-24 MED ORDER — IRBESARTAN 150 MG PO TABS
75.0000 mg | ORAL_TABLET | Freq: Every day | ORAL | Status: DC
Start: 2016-06-24 — End: 2016-06-25
  Filled 2016-06-24 (×3): qty 1

## 2016-06-24 MED ORDER — ONDANSETRON HCL 4 MG PO TABS: 4.0000 mg | ORAL_TABLET | Freq: Four times a day (QID) | ORAL | Status: DC | PRN

## 2016-06-24 MED ORDER — HALOPERIDOL LACTATE 5 MG/ML IJ SOLN: 2.0000 mg | Freq: Four times a day (QID) | INTRAMUSCULAR | Status: DC | PRN

## 2016-06-24 NOTE — Care Management Note (Signed)
Case Management Note  Patient Details  Name: Brianna [name] MRN: [id number] Date of Birth: [birth year]  Subjective/Objective: 83 y/o f admitted w/Acute metabolic encephalopathy. From home w/family. Spoke to [employer] on phone to explain observation status-voiced understanding,left copy in rm.Active w/[facility]-rep Archivist. PT cons-await recc.                   Action/Plan:d/c home w/HHC.   Expected Discharge Date:                  Expected Discharge Plan:  [place]  In-House Referral:     Discharge planning Services  CM Consult  Post Acute Care Choice:  [place] ([facility]- HHRN,PT/OT/social work) Choice offered to:  Adult Children  DME Arranged:    DME Agency:     HH Arranged:    HH Agency:     Status of Service:  In process, will continue to follow  If discussed at Long Length of Stay Meetings, dates discussed:    Additional Comments:  Dessa Phi, RN 06/24/2016, 12:55 PM

## 2016-06-24 NOTE — H&P (Addendum)
TRH H&P   Patient Demographics:    Brianna [name], is a 83 y.o. female  MRN: [id number]   DOB - Nov 13, 1932  Admit Date - 06/24/2016  Outpatient Primary MD for the patient is [name], MD    Patient coming from: Home  Chief Complaint  Patient presents with  . Aggressive Behavior      HPI:    Brianna [name]  is a 83 y.o. female, History of advanced enzyme is dementia, frequent hypokalemia associated with encephalopathy, hypertension, hypothyroidism, depression, dyslipidemia history of colonic polyps who lives at home and has been admitted in the past for metabolic encephalopathy associated with electrolyte imbalance was brought in by daughter with chief complains of severe agitation/delirium which is her usual presentation with electrolyte imbalance.  Patient came to the ER and was severely delirious, she was given Haldol and sedated, I was called to admit, upon my arrival patient is sleeping and resting comfortably, responds to painful stimuli and opens eyes to verbal commands but goes back to sleep. Clearly protecting her airway. Daughter sitting bedside provides history. She says patient was not complaining of any fever or chills, no headache, no chest pain abdominal pain or focal weakness prior to all this. She confirms that this is her usual presentation with electrolyte imbalance.    Review of systems:    Unobtainable, above history given by daughter   With Past History of the following :    Past Medical History:  Diagnosis Date  . Adenomatous colon polyp   . Dementia   . Depression with anxiety   . DUB (dysfunctional uterine bleeding)   . Elevated cholesterol   . Hyperlipidemia   . Hypertension     . Kidney stone   . Sleep apnea   . Thyroid disease    Hypothyroid      Past Surgical History:  Procedure Laterality Date  . ABDOMINAL HYSTERECTOMY    . APPENDECTOMY    . CHOLECYSTECTOMY    . Colon tumor-benign    . kidney stones        Social History:     Social History  Substance Use Topics  . Smoking status: Never Smoker  . Smokeless tobacco: Never Used  . Alcohol use No         Family History :     Family History  Problem Relation Age of Onset  . Diabetes Mother   . Heart disease Father   . Hypertension Brother   . Heart disease Brother   . Diabetes type II Brother   . Diabetes type II Sister   . Thyroid disease Child   . Diabetes Child        Home Medications:   Prior to Admission medications   Medication Sig Start Date End Date Taking? Authorizing Provider  esomeprazole (NEXIUM) 40 MG capsule Take 40 mg by mouth every morning.    Yes [provider]  galantamine (RAZADYNE ER) 16 MG 24 hr capsule TAKE ONE CAPSULE BY MOUTH EVERY DAY WITH BREAKFAST Patient taking differently: Take 16 mg by mouth daily with breakfast.  10/06/15  Yes Dennie Bible, NP  iron polysaccharides (NU-IRON) 150 MG capsule Take 150-300 mg by mouth See admin instructions. Take 1 capsule in the morning Take 2 capsules at lunch 06/02/12  Yes [provider]  levothyroxine (SYNTHROID, LEVOTHROID) 100 MCG tablet Take 100 mcg by mouth daily before breakfast.  07/30/15  Yes [provider]  nisoldipine (SULAR) 17 MG 24 hr tablet Take 17 mg by mouth every morning.  03/13/09  Yes [provider]  PRISTIQ 50 MG 24 hr tablet Take 50 mg by mouth every morning.  04/05/13  Yes [provider]  traZODone (DESYREL) 50 MG tablet Take 25 mg by mouth at bedtime.  02/16/13  Yes [provider]  valsartan (DIOVAN) 320 MG tablet Take 320 mg by mouth every morning.    Yes [provider]  Vitamin D, Ergocalciferol, (DRISDOL) 50000 UNITS CAPS  capsule Take 50,000 Units by mouth every 7 (seven) days. Tuesday   Yes [provider]     Allergies:     Allergies  Allergen Reactions  . Penicillins Other (See Comments)    "didn't sit well" Has patient had a PCN reaction causing immediate rash, facial/tongue/throat swelling, SOB or lightheadedness with hypotension: No Has patient had a PCN reaction causing severe rash involving mucus membranes or skin necrosis: No Has patient had a PCN reaction that required hospitalization No Has patient had a PCN reaction occurring within the last 10 years: No If all of the above answers are "NO", then may proceed with Cephalosporin use.     Physical Exam:   Vitals  Blood pressure (!) 142/63, pulse (!) 52, temperature 98.5 F (36.9 C), temperature source Axillary, resp. rate 18, SpO2 99 %.   1. General Frail elderly white female lying in hospital bed, currently sleepy and somnolent. Opens eyes to verbal commands, does not follow commands at this time.  2. Moves all 4 extremities to painful stimuli.  3. Limited exam due to advanced dementia and now somnolent after Haldol, bilateral Plantars down going.  4. Ears and Eyes appear Normal, Conjunctivae clear, PERRLA. Moist Oral Mucosa.  5. Supple Neck, No JVD, No cervical lymphadenopathy appriciated, No Carotid Bruits.  6. Symmetrical Chest wall movement, Good air movement bilaterally, CTAB.  7. RRR, No Gallops, Rubs or Murmurs, No Parasternal Heave.  8. Positive Bowel Sounds, Abdomen Soft, No tenderness, No organomegaly appriciated,No rebound -guarding or rigidity.  9.  No Cyanosis, Normal Skin Turgor, No Skin Rash or Bruise.  10. Good muscle tone,  joints appear normal , no effusions, Normal ROM.  11. No Palpable Lymph Nodes in Neck or Axillae     Data Review:    CBC  Recent Labs Lab 06/24/16 0529 06/24/16 0540 06/24/16 0631  WBC  8.0  --   --   HGB 12.5 13.3 13.3  HCT 36.9 39.0 39.0  PLT 248  --   --   MCV 80.9   --   --   MCH 27.4  --   --   MCHC 33.9  --   --   RDW 14.0  --   --   LYMPHSABS 2.4  --   --   MONOABS 1.0  --   --   EOSABS 0.1  --   --   BASOSABS 0.0  --   --    ------------------------------------------------------------------------------------------------------------------  Chemistries   Recent Labs Lab 06/24/16 0540 06/24/16 0631  NA 135  137 137  K 2.2*  2.2* 2.2*  CL 99*  98* 98*  CO2 26  --   GLUCOSE 105*  101* 95  BUN 12  11 12   CREATININE 0.97  0.90 0.90  CALCIUM 9.4  --   MG 1.9  --    ------------------------------------------------------------------------------------------------------------------ CrCl cannot be calculated (Unknown ideal weight.). ------------------------------------------------------------------------------------------------------------------ No results for input(s): TSH, T4TOTAL, T3FREE, THYROIDAB in the last 72 hours.  Invalid input(s): FREET3  Coagulation profile No results for input(s): INR, PROTIME in the last 168 hours. ------------------------------------------------------------------------------------------------------------------- No results for input(s): DDIMER in the last 72 hours. -------------------------------------------------------------------------------------------------------------------  Cardiac Enzymes No results for input(s): CKMB, TROPONINI, MYOGLOBIN in the last 168 hours.  Invalid input(s): CK ------------------------------------------------------------------------------------------------------------------ No results found for: BNP   ---------------------------------------------------------------------------------------------------------------  Urinalysis    Component Value Date/Time   COLORURINE STRAW (A) 06/24/2016 [place] 06/24/2016 0550   LABSPEC 1.002 (L) 06/24/2016 0550   PHURINE 6.0 06/24/2016 0550   GLUCOSEU NEGATIVE 06/24/2016 0550   HGBUR NEGATIVE 06/24/2016 0550    BILIRUBINUR NEGATIVE 06/24/2016 0550   KETONESUR NEGATIVE 06/24/2016 0550   PROTEINUR NEGATIVE 06/24/2016 0550   UROBILINOGEN 0.2 02/04/2014 1509   NITRITE NEGATIVE 06/24/2016 0550   LEUKOCYTESUR NEGATIVE 06/24/2016 0550    ----------------------------------------------------------------------------------------------------------------   Imaging Results:    No results found.  Baseline EKG and chest x-ray ordered   Assessment & Plan:     1. Severe delirium/metabolic encephalopathy in a patient with advanced enzymes dementia. Per daughter this is her typical presentation with low potassium - she will be kept and 23 on observation, potassium will be replaced along with 1 g of magnesium, monitor BMP and potassium, patient has frequent loose stools and this could be cause of her recurrent hypokalemia. We'll request PCP to monitor potassium levels closely in the outpatient setting after discharge, may need to be discharged on potassium supplement. Patient will be kept with fall and aspiration precautions, soft diet for now. Haldol when necessary for agitation. Daughter told that if she is severely agitated she will be restrained.  We'll get baseline chest x-ray and EKG, if QTC stable may place her on scheduled Seroquel. QTc in the past has been less than 420 ms, also daughter are not interested in palliative care conversation and wants mother to be full code, daughter agrees that patient has very poor quality of life.   2. GERD. On PPI.  3. Hypertension. Home dose ARB and calcium channel blocker to be continued.  4. Hypothyroidism continue home dose Synthroid check TSH.  5. Generalized weakness. PT eval. Lives at home with husband with close assistance from daughter.    DVT Prophylaxis Heparin    AM Labs Ordered, also please review Full Orders  Family Communication: Admission, patients condition and plan of care including tests being ordered  have been discussed with the patient and  daughter who indicate understanding and agree with the plan and Code Status.  Code Status Full  Likely DC to  Home 1-2 days  Condition Fair  Consults called: None    Admission status: Obs    Time spent in minutes : 30   Lala Lund M.D on 06/24/2016 at 7:28 AM  Between 7am to 7pm - Pager - [phone] ( page via [facility], text pages only, please mention full 10 digit call back number).  After 7pm go to [email or web address] - password [facility]  [facility] - Office  [phone]

## 2016-06-24 NOTE — Progress Notes (Signed)
PT Cancellation Note  Patient Details Name: Brianna [name] MRN: [id number] DOB: [birth year]   Cancelled Treatment:    Reason Eval/Treat Not Completed: Patient declined, no reason specified. Will check back another day.    Weston Anna, MPT Pager: [phone]

## 2016-06-24 NOTE — ED Notes (Signed)
Abnormal labs result MD Palumbo and RN Junior have been made aware

## 2016-06-24 NOTE — ED Notes (Signed)
Abnormal lab result MD Palumbo have been made aware 

## 2016-06-24 NOTE — ED Notes (Signed)
Notified Dr Randal Buba and Junior RN of potassium 2.2.

## 2016-06-24 NOTE — Evaluation (Signed)
Physical Therapy Evaluation Patient Details Name: DESERA GRAFFEO MRN: [id number] DOB: [birth year] Today's Date: 06/24/2016   History of Present Illness  83 yo female admitted with acute metabolic encephalopathy, aggressive behavior, hypokalemia. Hx of dementia, HTN, syncope, sleep apnea,depression.     Clinical Impression  On eval, pt required Min assist for mobility. She walked ~25 feet with a RW. No family present during session. Pt was unsteady. Recommend HHPT and 24 hour supervision/assist. Will follow during hospital stay.     Follow Up Recommendations Home health PT;Supervision/Assistance - 24 hour    Equipment Recommendations  Rolling walker with 5" wheels (if pt doesn't already have one)    Recommendations for Other Services       Precautions / Restrictions Precautions Precautions: Fall Restrictions Weight Bearing Restrictions: No      Mobility  Bed Mobility Overal bed mobility: Needs Assistance Bed Mobility: Supine to Sit     Supine to sit: Min assist;HOB elevated     General bed mobility comments: small amount of assist to get to EOB. Increased time.   Transfers Overall transfer level: Needs assistance Equipment used: Rolling walker (2 wheeled) Transfers: Sit to/from Stand Sit to Stand: Min assist         General transfer comment: Assist to rise, stabilize, control descent. Stand pivot, bed to bsc.   Ambulation/Gait Ambulation/Gait assistance: Min assist Ambulation Distance (Feet): 25 Feet Assistive device: Rolling walker (2 wheeled) Gait Pattern/deviations: Step-through pattern;Decreased stride length     General Gait Details: Assist to stabilize. Decreased distance due to need to toilet.   Stairs            Wheelchair Mobility    Modified Rankin (Stroke Patients Only)       Balance                                             Pertinent Vitals/Pain Pain Assessment: No/denies pain    Home Living Family/patient  expects to be discharged to:: Private residence   Available Help at Discharge: Family Type of Home: House           Additional Comments: no family present to provide info    Prior Function           Comments: no family present to provide PLOF     Hand Dominance        Extremity/Trunk Assessment   Upper Extremity Assessment Upper Extremity Assessment: Generalized weakness    Lower Extremity Assessment Lower Extremity Assessment: Generalized weakness    Cervical / Trunk Assessment Cervical / Trunk Assessment: Kyphotic  Communication   Communication: No difficulties  Cognition Arousal/Alertness: Awake/alert Behavior During Therapy: WFL for tasks assessed/performed Overall Cognitive Status: History of cognitive impairments - at baseline                                        General Comments      Exercises     Assessment/Plan    PT Assessment Patient needs continued PT services  PT Problem List Decreased strength;Decreased mobility;Decreased activity tolerance;Decreased balance;Decreased knowledge of use of DME;Decreased cognition       PT Treatment Interventions DME instruction;Gait training;Therapeutic activities;Therapeutic exercise;Patient/family education;Balance training;Functional mobility training    PT Goals (Current goals can be found in the Care  Plan section)  Acute Rehab PT Goals Patient Stated Goal: none stated. no family present. PT Goal Formulation: Patient unable to participate in goal setting Time For Goal Achievement: 07/08/16 Potential to Achieve Goals: Fair    Frequency Min 3X/week   Barriers to discharge        Co-evaluation               AM-PAC PT "6 Clicks" Daily Activity  Outcome Measure Difficulty turning over in bed (including adjusting bedclothes, sheets and blankets)?: A Little Difficulty moving from lying on back to sitting on the side of the bed? : A Little Difficulty sitting down on and  standing up from a chair with arms (e.g., wheelchair, bedside commode, etc,.)?: A Little Help needed moving to and from a bed to chair (including a wheelchair)?: A Little Help needed walking in hospital room?: A Little Help needed climbing 3-5 steps with a railing? : A Little 6 Click Score: 18    End of Session Equipment Utilized During Treatment: Gait belt Activity Tolerance: Patient tolerated treatment well Patient left: in chair;with call bell/phone within reach;with chair alarm set   PT Visit Diagnosis: Muscle weakness (generalized) (M62.81);Difficulty in walking, not elsewhere classified (R26.2)    Time: 4562-5638 PT Time Calculation (min) (ACUTE ONLY): 19 min   Charges:   PT Evaluation $PT Eval Low Complexity: 1 Procedure     PT G Codes:   PT G-Codes **NOT FOR INPATIENT CLASS** Functional Assessment Tool Used: AM-PAC 6 Clicks Basic Mobility;Clinical judgement Functional Limitation: Mobility: Walking and moving around Mobility: Walking and Moving Around Current Status (L3734): At least 20 percent but less than 40 percent impaired, limited or restricted Mobility: Walking and Moving Around Goal Status [phone]): At least 1 percent but less than 20 percent impaired, limited or restricted      Weston Anna, MPT Pager: [phone]

## 2016-06-24 NOTE — ED Provider Notes (Signed)
[place] [facility] DEPT Provider Note   CSN: [id number] Arrival date & time: 06/24/16  0325     History   Chief Complaint Chief Complaint  Patient presents with  . Aggressive Behavior    HPI Brianna [name] is a 83 y.o. female.  The history is provided by the EMS personnel. The history is limited by the condition of the patient (given medication by ems now sleeping).  Altered Mental Status   This is a recurrent problem. The current episode started more than 2 days ago. The problem has been resolved. Associated symptoms include agitation. Associated symptoms comments: Agitation and aggressiveness. Risk factors: dementia. Her past medical history is significant for dementia.    Past Medical History:  Diagnosis Date  . Adenomatous colon polyp   . Dementia   . Depression with anxiety   . DUB (dysfunctional uterine bleeding)   . Elevated cholesterol   . Hyperlipidemia   . Hypertension   . Kidney stone   . Sleep apnea   . Thyroid disease    Hypothyroid    Patient Active Problem List   Diagnosis Date Noted  . Hypokalemia 06/24/2016  . Depression 05/06/2016  . Vitamin D deficiency 05/06/2016  . Insomnia 05/06/2016  . Acute metabolic encephalopathy 76/72/0947  . Delirium 04/22/2016  . Leukocytosis 03/19/2015  . AKI (acute kidney injury) ([place]) 03/14/2015  . GERD (gastroesophageal reflux disease) 03/14/2015  . Diaper dermatitis 03/14/2015  . Syncope 03/08/2015  . Physical deconditioning 03/08/2015  . Alzheimer's dementia with behavioral disturbance 10/11/2014  . Iron deficiency anemia 10/26/2013  . Diarrhea 10/26/2013  . Near syncope 09/06/2013  . Hypothyroid 09/06/2013  . Hyponatremia 02/13/2013  . Weakness 02/13/2013  . 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 Hypersomnia with sleep apnea, unspecified 11/17/2012  . DUB (dysfunctional uterine bleeding)   . Hypertension   . Dementia   . Elevated cholesterol   . Kidney  stone     Past Surgical History:  Procedure Laterality Date  . ABDOMINAL HYSTERECTOMY    . APPENDECTOMY    . CHOLECYSTECTOMY    . Colon tumor-benign    . kidney stones      OB History    Gravida Para Term Preterm AB Living   3 3 3     3    SAB TAB Ectopic Multiple Live Births                   Home Medications    Prior to Admission medications   Medication Sig Start Date End Date Taking? Authorizing Provider  esomeprazole (NEXIUM) 40 MG capsule Take 40 mg by mouth every morning.    Yes [provider]  galantamine (RAZADYNE ER) 16 MG 24 hr capsule TAKE ONE CAPSULE BY MOUTH EVERY DAY WITH BREAKFAST Patient taking differently: Take 16 mg by mouth daily with breakfast.  10/06/15  Yes Dennie Bible, NP  iron polysaccharides (NU-IRON) 150 MG capsule Take 150-300 mg by mouth See admin instructions. Take 1 capsule in the morning Take 2 capsules at lunch 06/02/12  Yes [provider]  levothyroxine (SYNTHROID, LEVOTHROID) 100 MCG tablet Take 100 mcg by mouth daily before breakfast.  07/30/15  Yes [provider]  nisoldipine (SULAR) 17 MG 24 hr tablet Take 17 mg by mouth every morning.  03/13/09  Yes [provider]  PRISTIQ 50 MG 24 hr tablet Take 50 mg by mouth every morning.  04/05/13  Yes [provider]  traZODone (DESYREL) 50  MG tablet Take 25 mg by mouth at bedtime.  02/16/13  Yes [provider]  valsartan (DIOVAN) 320 MG tablet Take 320 mg by mouth every morning.    Yes [provider]  Vitamin D, Ergocalciferol, (DRISDOL) 50000 UNITS CAPS capsule Take 50,000 Units by mouth every 7 (seven) days. Tuesday   Yes [provider]    Family History Family History  Problem Relation Age of Onset  . Diabetes Mother   . Heart disease Father   . Hypertension Brother   . Heart disease Brother   . Diabetes type II Brother   . Diabetes type II Sister   . Thyroid disease Child   . Diabetes Child     Social  History Social History  Substance Use Topics  . Smoking status: Never Smoker  . Smokeless tobacco: Never Used  . Alcohol use No     Allergies   Penicillins   Review of Systems Review of Systems  Unable to perform ROS: Dementia  Constitutional: Negative for fever.  Psychiatric/Behavioral: Positive for agitation and behavioral problems.     Physical Exam Updated Vital Signs BP (!) 142/63 (BP Location: Left Arm)   Pulse (!) 52   Temp 98.5 F (36.9 C) (Axillary)   Resp 18   SpO2 99%   Physical Exam  Constitutional: She appears well-developed and well-nourished. No distress.  HENT:  Head: Normocephalic and atraumatic.  Eyes: Conjunctivae are normal. Pupils are equal, round, and reactive to light.  Neck: Normal range of motion. Neck supple.  Cardiovascular: Normal rate, regular rhythm, normal heart sounds and intact distal pulses.   Pulmonary/Chest: Effort normal and breath sounds normal.  Abdominal: Soft. Bowel sounds are normal. She exhibits no mass. There is no tenderness. There is no rebound and no guarding.  Musculoskeletal: Normal range of motion. She exhibits no edema.  Neurological: She displays normal reflexes.  Skin: Skin is warm and dry. Capillary refill takes less than 2 seconds.  Psychiatric:  Unable due to medication effect     ED Treatments / Results   Vitals:   06/24/16 0334 06/24/16 0558  BP: (!) 107/55 (!) 142/63  Pulse: 61 (!) 52  Resp: 14 18  Temp: 98.5 F (36.9 C)      Labs (all labs ordered are listed, but only abnormal results are displayed)  Results for orders placed or performed during the hospital encounter of 06/24/16  CBC with Differential/Platelet  Result Value Ref Range   WBC 8.0 4.0 - 10.5 K/uL   RBC 4.56 3.87 - 5.11 MIL/uL   Hemoglobin 12.5 12.0 - 15.0 g/dL   HCT 36.9 36.0 - 46.0 %   MCV 80.9 78.0 - 100.0 fL   MCH 27.4 26.0 - 34.0 pg   MCHC 33.9 30.0 - 36.0 g/dL   RDW 14.0 11.5 - 15.5 %   Platelets 248 150 - 400 K/uL    Neutrophils Relative % 57 %   Neutro Abs 4.5 1.7 - 7.7 K/uL   Lymphocytes Relative 30 %   Lymphs Abs 2.4 0.7 - 4.0 K/uL   Monocytes Relative 12 %   Monocytes Absolute 1.0 0.1 - 1.0 K/uL   Eosinophils Relative 1 %   Eosinophils Absolute 0.1 0.0 - 0.7 K/uL   Basophils Relative 0 %   Basophils Absolute 0.0 0.0 - 0.1 K/uL  Urinalysis, Routine w reflex microscopic  Result Value Ref Range   Color, Urine STRAW (A) YELLOW   APPearance CLEAR CLEAR   Specific Gravity, Urine 1.002 (  L) 1.005 - 1.030   pH 6.0 5.0 - 8.0   Glucose, UA NEGATIVE NEGATIVE mg/dL   Hgb urine dipstick NEGATIVE NEGATIVE   Bilirubin Urine NEGATIVE NEGATIVE   Ketones, ur NEGATIVE NEGATIVE mg/dL   Protein, ur NEGATIVE NEGATIVE mg/dL   Nitrite NEGATIVE NEGATIVE   Leukocytes, UA NEGATIVE NEGATIVE  Basic metabolic panel  Result Value Ref Range   Sodium 135 135 - 145 mmol/L   Potassium 2.2 (LL) 3.5 - 5.1 mmol/L   Chloride 99 (L) 101 - 111 mmol/L   CO2 26 22 - 32 mmol/L   Glucose, Bld 105 (H) 65 - 99 mg/dL   BUN 12 6 - 20 mg/dL   Creatinine, Ser 0.97 0.44 - 1.00 mg/dL   Calcium 9.4 8.9 - 10.3 mg/dL   GFR calc non Af Amer 53 (L) >60 mL/min   GFR calc Af Amer >60 >60 mL/min   Anion gap 10 5 - 15  Magnesium  Result Value Ref Range   Magnesium 1.9 1.7 - 2.4 mg/dL  I-Stat Chem 8, ED  Result Value Ref Range   Sodium 137 135 - 145 mmol/L   Potassium 2.2 (LL) 3.5 - 5.1 mmol/L   Chloride 98 (L) 101 - 111 mmol/L   BUN 11 6 - 20 mg/dL   Creatinine, Ser 0.90 0.44 - 1.00 mg/dL   Glucose, Bld 101 (H) 65 - 99 mg/dL   Calcium, Ion 1.17 1.15 - 1.40 mmol/L   TCO2 27 0 - 100 mmol/L   Hemoglobin 13.3 12.0 - 15.0 g/dL   HCT 39.0 36.0 - 46.0 %   Comment NOTIFIED PHYSICIAN   I-Stat Chem 8, ED  Result Value Ref Range   Sodium 137 135 - 145 mmol/L   Potassium 2.2 (LL) 3.5 - 5.1 mmol/L   Chloride 98 (L) 101 - 111 mmol/L   BUN 12 6 - 20 mg/dL   Creatinine, Ser 0.90 0.44 - 1.00 mg/dL   Glucose, Bld 95 65 - 99 mg/dL   Calcium, Ion  1.18 1.15 - 1.40 mmol/L   TCO2 29 0 - 100 mmol/L   Hemoglobin 13.3 12.0 - 15.0 g/dL   HCT 39.0 36.0 - 46.0 %   Comment NOTIFIED PHYSICIAN    No results found.  Radiology No results found.  Procedures Procedures (including critical care time)  Medications Ordered in ED Medications  potassium chloride 10 mEq in 100 mL IVPB (10 mEq Intravenous New Bag/Given 06/24/16 0640)  magnesium sulfate IVPB 1 g 100 mL (not administered)      Final Clinical Impressions(s) / ED Diagnoses   Final diagnoses:  Hypokalemia  will admit to medicine for hypokalemia and optimization of dementia regimen  New Prescriptions New Prescriptions   No medications on file     Airabella Barley, MD 06/24/16 [phone]

## 2016-06-24 NOTE — Progress Notes (Signed)
Patient refusing P.O. meds.

## 2016-06-24 NOTE — ED Triage Notes (Addendum)
Patient arrived with EMS from home. Per EMS patient was agitated and combative at home. Per EMS patient had  Versed 5mg  haldol 2.5mg  IM. Per daughter patient haven't sleep for 2 nights now then patient start getting violent at home. Patient has history of dementia. Pt asleep at this time.

## 2016-06-24 NOTE — Care Management Obs Status (Signed)
[facility] NOTIFICATION   Patient Details  Name: Brianna [name] MRN: [id number] Date of Birth: 1932/12/06   Medicare Observation Status Notification Given:  Yes    MahabirJuliann Pulse, RN 06/24/2016, 12:48 PM

## 2016-06-24 NOTE — ED Notes (Signed)
Bed: DV76 Expected date:  Expected time:  Means of arrival:  Comments: 69 f not sleeping/disruptive

## 2016-06-25 DIAGNOSIS — F0281 Dementia in other diseases classified elsewhere with behavioral disturbance: Secondary | ICD-10-CM | POA: Diagnosis not present

## 2016-06-25 DIAGNOSIS — E876 Hypokalemia: Secondary | ICD-10-CM | POA: Diagnosis not present

## 2016-06-25 DIAGNOSIS — R5381 Other malaise: Secondary | ICD-10-CM

## 2016-06-25 DIAGNOSIS — G301 Alzheimer's disease with late onset: Secondary | ICD-10-CM | POA: Diagnosis not present

## 2016-06-25 DIAGNOSIS — R4589 Other symptoms and signs involving emotional state: Secondary | ICD-10-CM

## 2016-06-25 DIAGNOSIS — L8990 Pressure ulcer of unspecified site, unspecified stage: Secondary | ICD-10-CM | POA: Insufficient documentation

## 2016-06-25 DIAGNOSIS — R410 Disorientation, unspecified: Secondary | ICD-10-CM | POA: Diagnosis not present

## 2016-06-25 DIAGNOSIS — G9341 Metabolic encephalopathy: Secondary | ICD-10-CM | POA: Diagnosis not present

## 2016-06-25 LAB — BASIC METABOLIC PANEL
Anion gap: 12 (ref 5–15)
BUN: 7 mg/dL (ref 6–20)
CO2: 23 mmol/L (ref 22–32)
CREATININE: 0.88 mg/dL (ref 0.44–1.00)
Calcium: 9 mg/dL (ref 8.9–10.3)
Chloride: 104 mmol/L (ref 101–111)
GFR calc Af Amer: >60 mL/min (ref >60)
GFR, EST NON AFRICAN AMERICAN: 60 mL/min — AB (ref >60)
GLUCOSE: 89 mg/dL (ref 65–99)
POTASSIUM: 3.3 mmol/L — AB (ref 3.5–5.1)
Sodium: 139 mmol/L (ref 135–145)

## 2016-06-25 LAB — MAGNESIUM: Magnesium: 1.8 mg/dL (ref 1.7–2.4)

## 2016-06-25 MED ORDER — SODIUM CHLORIDE 0.9 % IV SOLN
INTRAVENOUS | Status: DC
  Filled 2016-06-25: qty 1000

## 2016-06-25 MED ORDER — TRAZODONE HCL 50 MG PO TABS: 25.0000 mg | ORAL_TABLET | Freq: Every day | ORAL | Status: DC

## 2016-06-25 MED ORDER — GALANTAMINE HYDROBROMIDE ER 8 MG PO CP24
16.0000 mg | ORAL_CAPSULE | Freq: Every day | ORAL | Status: DC
  Filled 2016-06-25: qty 2

## 2016-06-25 MED ORDER — POTASSIUM CHLORIDE CRYS ER 20 MEQ PO TBCR: 20.0000 meq | EXTENDED_RELEASE_TABLET | Freq: Every day | ORAL | 0 refills | Status: DC

## 2016-06-25 MED ORDER — POTASSIUM CHLORIDE CRYS ER 20 MEQ PO TBCR
40.0000 meq | EXTENDED_RELEASE_TABLET | Freq: Two times a day (BID) | ORAL | Status: DC
  Administered 2016-06-25: 40 meq via ORAL
  Filled 2016-06-25: qty 2

## 2016-06-25 MED ORDER — POTASSIUM CHLORIDE CRYS ER 20 MEQ PO TBCR
40.0000 meq | EXTENDED_RELEASE_TABLET | Freq: Once | ORAL | Status: AC
  Administered 2016-06-25: 40 meq via ORAL
  Filled 2016-06-25: qty 2

## 2016-06-25 NOTE — Progress Notes (Signed)
Physical Therapy Treatment Patient Details Name: Brianna [name] MRN: [id number] DOB: November 11, 1932 Today's Date: 06/25/2016    History of Present Illness 83 yo female admitted with acute metabolic encephalopathy, aggressive behavior, hypokalemia. Hx of dementia, HTN, syncope, sleep apnea,depression.     PT Comments    Progressing slowly with mobility. Daughter present during session. Discussed d/c plan-pt will return home with family and HHPT f/u   Follow Up Recommendations  Home health PT;Supervision/Assistance - 24 hour     Equipment Recommendations  None recommended by PT    Recommendations for Other Services       Precautions / Restrictions Precautions Precautions: Fall Restrictions Weight Bearing Restrictions: No    Mobility  Bed Mobility Overal bed mobility: Needs Assistance Bed Mobility: Supine to Sit     Supine to sit: Min assist     General bed mobility comments: small amount of assist to get to EOB. Increased time.   Transfers Overall transfer level: Needs assistance   Transfers: Sit to/from Stand Sit to Stand: Min assist         General transfer comment: Assist to rise, stabilize, control descent. Stand pivot x 2, bed to bsc, then recliner to bsc  Ambulation/Gait Ambulation/Gait assistance: Min assist Ambulation Distance (Feet): 30 Feet Assistive device: None Gait Pattern/deviations: Step-through pattern;Decreased stride length;Decreased step length - right;Decreased step length - left     General Gait Details: Assist to stabilize. Pt declined to ambulate any farther.    Stairs            Wheelchair Mobility    Modified Rankin (Stroke Patients Only)       Balance                                            Cognition Arousal/Alertness: Awake/alert Behavior During Therapy: WFL for tasks assessed/performed Overall Cognitive Status: History of cognitive impairments - at baseline                                         Exercises      General Comments        Pertinent Vitals/Pain Pain Assessment: No/denies pain    Home Living                      Prior Function            PT Goals (current goals can now be found in the care plan section) Progress towards PT goals: Progressing toward goals    Frequency    Min 3X/week      PT Plan Current plan remains appropriate    Co-evaluation              AM-PAC PT "6 Clicks" Daily Activity  Outcome Measure  Difficulty turning over in bed (including adjusting bedclothes, sheets and blankets)?: A Little Difficulty moving from lying on back to sitting on the side of the bed? : A Little Difficulty sitting down on and standing up from a chair with arms (e.g., wheelchair, bedside commode, etc,.)?: A Little Help needed moving to and from a bed to chair (including a wheelchair)?: A Little Help needed walking in hospital room?: A Little Help needed climbing 3-5 steps with a railing? : A Little 6 Click Score: 18  End of Session Equipment Utilized During Treatment: Gait belt Activity Tolerance: Patient tolerated treatment well Patient left: in chair;with call bell/phone within reach;with family/visitor present;with nursing/sitter in room   PT Visit Diagnosis: Muscle weakness (generalized) (M62.81);Difficulty in walking, not elsewhere classified (R26.2)     Time: 0945-1000 PT Time Calculation (min) (ACUTE ONLY): 15 min  Charges:  $Gait Training: 8-22 mins                    G Codes:          Weston Anna, MPT Pager: [phone]

## 2016-06-25 NOTE — Discharge Summary (Signed)
Physician Discharge Summary  Brianna [name] ZOX:[id number] DOB: May 27, 1932 DOA: 06/24/2016  PCP: Adrian Prince, MD  Admit date: 06/24/2016 Discharge date: 06/25/2016  Admitted From: home Disposition:  Home  Recommendations for Outpatient Follow-up:  1. Follow up with PCP in 1-2 weeks 2. Please obtain BMP in one week 3. Home health to check a basic metabolic panel and sent to Dr. Evlyn Kanner in 1 week  Home Health:Yes Equipment/Devices:none  Discharge Condition:Guarded CODE STATUS:full Diet recommendation: Heart Healthy   Brief/Interim Summary:  83 y.o. female past medical history of advanced Alzheimer's dementia, frequent hypokalemia associated with encephalopathy with multiple admissions in the past metabolic encephalopathy due to electrolyte imbalance comes in with aggressive behavior. Daughter does not want to meet hospice or palliative care.  Discharge Diagnoses:  Principal Problem:   Acute metabolic encephalopathy Active Problems:   Hypothyroid   Alzheimer's dementia with behavioral disturbance   Physical deconditioning   GERD (gastroesophageal reflux disease)   Delirium   Hypokalemia   Pressure injury of skin  Acute metabolic encephalopathy/severe delirium probably due to electrolyte imbalance: In the setting of advanced Alzheimer's dementia with hypokalemia, per daughter this is typical presentation that she has when she gets hypokalemia. He was repleted orally and IV and her potassium improved, she had no EKG changes. She will go home on potassium supplements once a day will have to check a basic metabolic panel in one week and follow-up with Dr. Adria Devon in 2 weeks.  GERD Continue PPI.  Essential hypertension: Continue current regimen blood pressure seems to be stable.  HypoThyroidism: Continue Synthroid.  Pressure injury of skin   Discharge Instructions  Discharge Instructions    Diet - low sodium heart healthy    Complete by:  As directed     Face-to-face encounter (required for Medicare/Medicaid patients)    Complete by:  As directed    I David Stall, Westlee Devita certify that this patient is under my care and that I, or a nurse practitioner or physician's assistant working with me, had a face-to-face encounter that meets the physician face-to-face encounter requirements with this patient on 06/25/2016. The encounter with the patient was in whole, or in part for the following medical condition(s) which is the primary reason for home health care (List medical condition): Severe hypokalemia   The encounter with the patient was in whole, or in part, for the following medical condition, which is the primary reason for home health care:  hypokalemia   I certify that, based on my findings, the following services are medically necessary home health services:  Nursing   Reason for Medically Necessary Home Health Services:  Skilled Nursing- Lab Monitoring Requiring Frequent Medication Adjustment   My clinical findings support the need for the above services:  OTHER SEE COMMENTS   Further, I certify that my clinical findings support that this patient is homebound due to:  Mental confusion   Home Health    Complete by:  As directed    To provide the following care/treatments:  RN   Increase activity slowly    Complete by:  As directed      Allergies as of 06/25/2016      Reactions   Penicillins Other (See Comments)   "didn't sit well" Has patient had a PCN reaction causing immediate rash, facial/tongue/throat swelling, SOB or lightheadedness with hypotension: No Has patient had a PCN reaction causing severe rash involving mucus membranes or skin necrosis: No Has patient had a PCN reaction that required hospitalization No Has patient  had a PCN reaction occurring within the last 10 years: No If all of the above answers are "NO", then may proceed with Cephalosporin use.      Medication List    TAKE these medications   esomeprazole 40 MG  capsule Commonly known as:  NEXIUM Take 40 mg by mouth every morning.   galantamine 16 MG 24 hr capsule Commonly known as:  RAZADYNE ER TAKE ONE CAPSULE BY MOUTH EVERY DAY WITH BREAKFAST What changed:  how much to take  how to take this  when to take this  additional instructions   levothyroxine 100 MCG tablet Commonly known as:  SYNTHROID, LEVOTHROID Take 100 mcg by mouth daily before breakfast.   nisoldipine 17 MG 24 hr tablet Commonly known as:  SULAR Take 17 mg by mouth every morning.   NU-IRON 150 MG capsule Generic drug:  iron polysaccharides Take 150-300 mg by mouth See admin instructions. Take 1 capsule in the morning Take 2 capsules at lunch   potassium chloride SA 20 MEQ tablet Commonly known as:  K-DUR,KLOR-CON Take 1 tablet (20 mEq total) by mouth daily.   PRISTIQ 50 MG 24 hr tablet Generic drug:  desvenlafaxine Take 50 mg by mouth every morning.   traZODone 50 MG tablet Commonly known as:  DESYREL Take 25 mg by mouth at bedtime.   valsartan 320 MG tablet Commonly known as:  DIOVAN Take 320 mg by mouth every morning.   Vitamin D (Ergocalciferol) 50000 units Caps capsule Commonly known as:  DRISDOL Take 50,000 Units by mouth every 7 (seven) days. Tuesday       Allergies  Allergen Reactions  . Penicillins Other (See Comments)    "didn't sit well" Has patient had a PCN reaction causing immediate rash, facial/tongue/throat swelling, SOB or lightheadedness with hypotension: No Has patient had a PCN reaction causing severe rash involving mucus membranes or skin necrosis: No Has patient had a PCN reaction that required hospitalization No Has patient had a PCN reaction occurring within the last 10 years: No If all of the above answers are "NO", then may proceed with Cephalosporin use.    Consultations:  None   Procedures/Studies: Dg Chest Port 1 View  Result Date: 06/24/2016 CLINICAL DATA:  Shortness of breath and agitation EXAM: PORTABLE  CHEST 1 VIEW COMPARISON:  04/22/2016 FINDINGS: Cardiac shadow is at the upper limits of normal in size. The lungs are clear bilaterally. No acute bony abnormality is seen. IMPRESSION: No active disease. Electronically Signed   By: Alcide Clever M.D.   On: 06/24/2016 07:34     Subjective: Pleasantly confused.  Discharge Exam: Vitals:   06/24/16 2148 06/25/16 0500  BP: (!) 170/72 (!) 156/55  Pulse: 61 60  Resp: 18 18  Temp: 99.7 F (37.6 C) 98.2 F (36.8 C)   Vitals:   06/24/16 1839 06/24/16 2128 06/24/16 2148 06/25/16 0500  BP: 130/83 137/72 (!) 170/72 (!) 156/55  Pulse: 79 93 61 60  Resp: 18 18 18 18   Temp:  99.3 F (37.4 C) 99.7 F (37.6 C) 98.2 F (36.8 C)  TempSrc:  Oral Oral Axillary  SpO2: 100% 98% 99% 97%  Weight:    52.5 kg (115 lb 11.9 oz)  Height:        General exam: In no acute distress. Respiratory system: Good air movement and clear to auscultation. Cardiovascular system: S1 & S2 heard, RRR. No JVD, murmurs. Gastrointestinal system: Abdomen is nondistended, soft and nontender.  Central nervous system: Alert and oriented.  No focal neurological deficits. Extremities: No pedal edema. Skin: No rashes, lesions or ulcers     The results of significant diagnostics from this hospitalization (including imaging, microbiology, ancillary and laboratory) are listed below for reference.     Microbiology: No results found for this or any previous visit (from the past 240 hour(s)).   Labs: BNP (last 3 results) No results for input(s): BNP in the last 8760 hours. Basic Metabolic Panel:  Recent Labs Lab 06/24/16 0540 06/24/16 0631 06/24/16 2302  NA 135  137 137  --   K 2.2*  2.2* 2.2* 3.2*  CL 99*  98* 98*  --   CO2 26  --   --   GLUCOSE 105*  101* 95  --   BUN 12  11 12   --   CREATININE 0.97  0.90 0.90  --   CALCIUM 9.4  --   --   MG 1.9  --   --    Liver Function Tests: No results for input(s): AST, ALT, ALKPHOS, BILITOT, PROT, ALBUMIN in the  last 168 hours. No results for input(s): LIPASE, AMYLASE in the last 168 hours. No results for input(s): AMMONIA in the last 168 hours. CBC:  Recent Labs Lab 06/24/16 0529 06/24/16 0540 06/24/16 0631  WBC 8.0  --   --   NEUTROABS 4.5  --   --   HGB 12.5 13.3 13.3  HCT 36.9 39.0 39.0  MCV 80.9  --   --   PLT 248  --   --    Cardiac Enzymes: No results for input(s): CKTOTAL, CKMB, CKMBINDEX, TROPONINI in the last 168 hours. BNP: Invalid input(s): POCBNP CBG: No results for input(s): GLUCAP in the last 168 hours. D-Dimer No results for input(s): DDIMER in the last 72 hours. Hgb A1c No results for input(s): HGBA1C in the last 72 hours. Lipid Profile No results for input(s): CHOL, HDL, LDLCALC, TRIG, CHOLHDL, LDLDIRECT in the last 72 hours. Thyroid function studies  Recent Labs  06/24/16 0939  TSH 2.465   Anemia work up No results for input(s): VITAMINB12, FOLATE, FERRITIN, TIBC, IRON, RETICCTPCT in the last 72 hours. Urinalysis    Component Value Date/Time   COLORURINE STRAW (A) 06/24/2016 0550   APPEARANCEUR CLEAR 06/24/2016 0550   LABSPEC 1.002 (L) 06/24/2016 0550   PHURINE 6.0 06/24/2016 0550   GLUCOSEU NEGATIVE 06/24/2016 0550   HGBUR NEGATIVE 06/24/2016 0550   BILIRUBINUR NEGATIVE 06/24/2016 0550   KETONESUR NEGATIVE 06/24/2016 0550   PROTEINUR NEGATIVE 06/24/2016 0550   UROBILINOGEN 0.2 02/04/2014 1509   NITRITE NEGATIVE 06/24/2016 0550   LEUKOCYTESUR NEGATIVE 06/24/2016 0550   Sepsis Labs Invalid input(s): PROCALCITONIN,  WBC,  LACTICIDVEN Microbiology No results found for this or any previous visit (from the past 240 hour(s)).   Time coordinating discharge: Over 30 minutes  SIGNED:   Marinda Elk, MD  [facility] 06/25/2016, 9:47 AM Pager   If 7PM-7AM, please contact night-coverage [email or web address] Password [email or web address]

## 2016-06-26 DIAGNOSIS — F0390 Unspecified dementia without behavioral disturbance: Secondary | ICD-10-CM | POA: Diagnosis not present

## 2016-06-26 DIAGNOSIS — F418 Other specified anxiety disorders: Secondary | ICD-10-CM | POA: Diagnosis not present

## 2016-06-26 DIAGNOSIS — G9341 Metabolic encephalopathy: Secondary | ICD-10-CM | POA: Diagnosis not present

## 2016-06-26 DIAGNOSIS — M6281 Muscle weakness (generalized): Secondary | ICD-10-CM | POA: Diagnosis not present

## 2016-06-26 DIAGNOSIS — I10 Essential (primary) hypertension: Secondary | ICD-10-CM | POA: Diagnosis not present

## 2016-06-28 DIAGNOSIS — M6281 Muscle weakness (generalized): Secondary | ICD-10-CM | POA: Diagnosis not present

## 2016-06-28 DIAGNOSIS — I10 Essential (primary) hypertension: Secondary | ICD-10-CM | POA: Diagnosis not present

## 2016-06-28 DIAGNOSIS — E876 Hypokalemia: Secondary | ICD-10-CM | POA: Diagnosis not present

## 2016-06-28 DIAGNOSIS — F0390 Unspecified dementia without behavioral disturbance: Secondary | ICD-10-CM | POA: Diagnosis not present

## 2016-06-28 DIAGNOSIS — G9341 Metabolic encephalopathy: Secondary | ICD-10-CM | POA: Diagnosis not present

## 2016-06-28 DIAGNOSIS — F418 Other specified anxiety disorders: Secondary | ICD-10-CM | POA: Diagnosis not present

## 2016-06-28 DIAGNOSIS — Z6822 Body mass index (BMI) 22.0-22.9, adult: Secondary | ICD-10-CM | POA: Diagnosis not present

## 2016-06-28 DIAGNOSIS — G309 Alzheimer's disease, unspecified: Secondary | ICD-10-CM | POA: Diagnosis not present

## 2016-07-01 DIAGNOSIS — I10 Essential (primary) hypertension: Secondary | ICD-10-CM | POA: Diagnosis not present

## 2016-07-01 DIAGNOSIS — G9341 Metabolic encephalopathy: Secondary | ICD-10-CM | POA: Diagnosis not present

## 2016-07-01 DIAGNOSIS — F0390 Unspecified dementia without behavioral disturbance: Secondary | ICD-10-CM | POA: Diagnosis not present

## 2016-07-01 DIAGNOSIS — M6281 Muscle weakness (generalized): Secondary | ICD-10-CM | POA: Diagnosis not present

## 2016-07-01 DIAGNOSIS — F418 Other specified anxiety disorders: Secondary | ICD-10-CM | POA: Diagnosis not present

## 2016-07-04 DIAGNOSIS — F0390 Unspecified dementia without behavioral disturbance: Secondary | ICD-10-CM | POA: Diagnosis not present

## 2016-07-04 DIAGNOSIS — M6281 Muscle weakness (generalized): Secondary | ICD-10-CM | POA: Diagnosis not present

## 2016-07-04 DIAGNOSIS — I10 Essential (primary) hypertension: Secondary | ICD-10-CM | POA: Diagnosis not present

## 2016-07-04 DIAGNOSIS — F418 Other specified anxiety disorders: Secondary | ICD-10-CM | POA: Diagnosis not present

## 2016-07-04 DIAGNOSIS — G9341 Metabolic encephalopathy: Secondary | ICD-10-CM | POA: Diagnosis not present

## 2016-07-05 DIAGNOSIS — I10 Essential (primary) hypertension: Secondary | ICD-10-CM | POA: Diagnosis not present

## 2016-07-05 DIAGNOSIS — M6281 Muscle weakness (generalized): Secondary | ICD-10-CM | POA: Diagnosis not present

## 2016-07-05 DIAGNOSIS — F418 Other specified anxiety disorders: Secondary | ICD-10-CM | POA: Diagnosis not present

## 2016-07-05 DIAGNOSIS — G9341 Metabolic encephalopathy: Secondary | ICD-10-CM | POA: Diagnosis not present

## 2016-07-05 DIAGNOSIS — F0390 Unspecified dementia without behavioral disturbance: Secondary | ICD-10-CM | POA: Diagnosis not present

## 2016-07-08 DIAGNOSIS — F418 Other specified anxiety disorders: Secondary | ICD-10-CM | POA: Diagnosis not present

## 2016-07-08 DIAGNOSIS — F0390 Unspecified dementia without behavioral disturbance: Secondary | ICD-10-CM | POA: Diagnosis not present

## 2016-07-08 DIAGNOSIS — G9341 Metabolic encephalopathy: Secondary | ICD-10-CM | POA: Diagnosis not present

## 2016-07-08 DIAGNOSIS — M6281 Muscle weakness (generalized): Secondary | ICD-10-CM | POA: Diagnosis not present

## 2016-07-08 DIAGNOSIS — I10 Essential (primary) hypertension: Secondary | ICD-10-CM | POA: Diagnosis not present

## 2016-07-09 DIAGNOSIS — F418 Other specified anxiety disorders: Secondary | ICD-10-CM | POA: Diagnosis not present

## 2016-07-09 DIAGNOSIS — I10 Essential (primary) hypertension: Secondary | ICD-10-CM | POA: Diagnosis not present

## 2016-07-09 DIAGNOSIS — F0390 Unspecified dementia without behavioral disturbance: Secondary | ICD-10-CM | POA: Diagnosis not present

## 2016-07-09 DIAGNOSIS — G9341 Metabolic encephalopathy: Secondary | ICD-10-CM | POA: Diagnosis not present

## 2016-07-09 DIAGNOSIS — M6281 Muscle weakness (generalized): Secondary | ICD-10-CM | POA: Diagnosis not present

## 2016-07-11 DIAGNOSIS — G9341 Metabolic encephalopathy: Secondary | ICD-10-CM | POA: Diagnosis not present

## 2016-07-11 DIAGNOSIS — M6281 Muscle weakness (generalized): Secondary | ICD-10-CM | POA: Diagnosis not present

## 2016-07-11 DIAGNOSIS — F0390 Unspecified dementia without behavioral disturbance: Secondary | ICD-10-CM | POA: Diagnosis not present

## 2016-07-11 DIAGNOSIS — F418 Other specified anxiety disorders: Secondary | ICD-10-CM | POA: Diagnosis not present

## 2016-07-11 DIAGNOSIS — I10 Essential (primary) hypertension: Secondary | ICD-10-CM | POA: Diagnosis not present

## 2016-07-12 DIAGNOSIS — F418 Other specified anxiety disorders: Secondary | ICD-10-CM | POA: Diagnosis not present

## 2016-07-12 DIAGNOSIS — M6281 Muscle weakness (generalized): Secondary | ICD-10-CM | POA: Diagnosis not present

## 2016-07-12 DIAGNOSIS — I10 Essential (primary) hypertension: Secondary | ICD-10-CM | POA: Diagnosis not present

## 2016-07-12 DIAGNOSIS — F0390 Unspecified dementia without behavioral disturbance: Secondary | ICD-10-CM | POA: Diagnosis not present

## 2016-07-12 DIAGNOSIS — G9341 Metabolic encephalopathy: Secondary | ICD-10-CM | POA: Diagnosis not present

## 2016-07-16 DIAGNOSIS — G9341 Metabolic encephalopathy: Secondary | ICD-10-CM | POA: Diagnosis not present

## 2016-07-16 DIAGNOSIS — F0390 Unspecified dementia without behavioral disturbance: Secondary | ICD-10-CM | POA: Diagnosis not present

## 2016-07-16 DIAGNOSIS — F418 Other specified anxiety disorders: Secondary | ICD-10-CM | POA: Diagnosis not present

## 2016-07-16 DIAGNOSIS — M6281 Muscle weakness (generalized): Secondary | ICD-10-CM | POA: Diagnosis not present

## 2016-07-16 DIAGNOSIS — I10 Essential (primary) hypertension: Secondary | ICD-10-CM | POA: Diagnosis not present

## 2016-07-17 DIAGNOSIS — Z9181 History of falling: Secondary | ICD-10-CM | POA: Diagnosis not present

## 2016-07-17 DIAGNOSIS — G9341 Metabolic encephalopathy: Secondary | ICD-10-CM | POA: Diagnosis not present

## 2016-07-17 DIAGNOSIS — F418 Other specified anxiety disorders: Secondary | ICD-10-CM | POA: Diagnosis not present

## 2016-07-17 DIAGNOSIS — Z86010 Personal history of colonic polyps: Secondary | ICD-10-CM | POA: Diagnosis not present

## 2016-07-17 DIAGNOSIS — M5031 Other cervical disc degeneration,  high cervical region: Secondary | ICD-10-CM | POA: Diagnosis not present

## 2016-07-17 DIAGNOSIS — M6281 Muscle weakness (generalized): Secondary | ICD-10-CM | POA: Diagnosis not present

## 2016-07-17 DIAGNOSIS — G308 Other Alzheimer's disease: Secondary | ICD-10-CM | POA: Diagnosis not present

## 2016-07-17 DIAGNOSIS — G309 Alzheimer's disease, unspecified: Secondary | ICD-10-CM | POA: Diagnosis not present

## 2016-07-17 DIAGNOSIS — F0281 Dementia in other diseases classified elsewhere with behavioral disturbance: Secondary | ICD-10-CM | POA: Diagnosis not present

## 2016-07-17 DIAGNOSIS — L89212 Pressure ulcer of right hip, stage 2: Secondary | ICD-10-CM | POA: Diagnosis not present

## 2016-07-17 DIAGNOSIS — I10 Essential (primary) hypertension: Secondary | ICD-10-CM | POA: Diagnosis not present

## 2016-07-18 DIAGNOSIS — I10 Essential (primary) hypertension: Secondary | ICD-10-CM | POA: Diagnosis not present

## 2016-07-18 DIAGNOSIS — Z86010 Personal history of colonic polyps: Secondary | ICD-10-CM | POA: Diagnosis not present

## 2016-07-18 DIAGNOSIS — F418 Other specified anxiety disorders: Secondary | ICD-10-CM | POA: Diagnosis not present

## 2016-07-18 DIAGNOSIS — M5031 Other cervical disc degeneration,  high cervical region: Secondary | ICD-10-CM | POA: Diagnosis not present

## 2016-07-18 DIAGNOSIS — F0281 Dementia in other diseases classified elsewhere with behavioral disturbance: Secondary | ICD-10-CM | POA: Diagnosis not present

## 2016-07-18 DIAGNOSIS — G309 Alzheimer's disease, unspecified: Secondary | ICD-10-CM | POA: Diagnosis not present

## 2016-07-24 DIAGNOSIS — Z86010 Personal history of colonic polyps: Secondary | ICD-10-CM | POA: Diagnosis not present

## 2016-07-24 DIAGNOSIS — M5031 Other cervical disc degeneration,  high cervical region: Secondary | ICD-10-CM | POA: Diagnosis not present

## 2016-07-24 DIAGNOSIS — I10 Essential (primary) hypertension: Secondary | ICD-10-CM | POA: Diagnosis not present

## 2016-07-24 DIAGNOSIS — F418 Other specified anxiety disorders: Secondary | ICD-10-CM | POA: Diagnosis not present

## 2016-07-24 DIAGNOSIS — F0281 Dementia in other diseases classified elsewhere with behavioral disturbance: Secondary | ICD-10-CM | POA: Diagnosis not present

## 2016-07-24 DIAGNOSIS — G309 Alzheimer's disease, unspecified: Secondary | ICD-10-CM | POA: Diagnosis not present

## 2016-07-30 DIAGNOSIS — F418 Other specified anxiety disorders: Secondary | ICD-10-CM | POA: Diagnosis not present

## 2016-07-30 DIAGNOSIS — M5031 Other cervical disc degeneration,  high cervical region: Secondary | ICD-10-CM | POA: Diagnosis not present

## 2016-07-30 DIAGNOSIS — I10 Essential (primary) hypertension: Secondary | ICD-10-CM | POA: Diagnosis not present

## 2016-07-30 DIAGNOSIS — Z86010 Personal history of colonic polyps: Secondary | ICD-10-CM | POA: Diagnosis not present

## 2016-07-30 DIAGNOSIS — F0281 Dementia in other diseases classified elsewhere with behavioral disturbance: Secondary | ICD-10-CM | POA: Diagnosis not present

## 2016-07-30 DIAGNOSIS — G309 Alzheimer's disease, unspecified: Secondary | ICD-10-CM | POA: Diagnosis not present

## 2016-07-31 ENCOUNTER — Encounter (HOSPITAL_COMMUNITY): Payer: Self-pay | Admitting: Emergency Medicine

## 2016-07-31 ENCOUNTER — Emergency Department (HOSPITAL_COMMUNITY)
Admission: EM | Admit: 2016-07-31 | Discharge: 2016-07-31 | Disposition: A | Discharge status: Home / Self Care | Payer: Medicare Other | Attending: Emergency Medicine | Admitting: Emergency Medicine

## 2016-07-31 DIAGNOSIS — I10 Essential (primary) hypertension: Secondary | ICD-10-CM | POA: Insufficient documentation

## 2016-07-31 DIAGNOSIS — Z79899 Other long term (current) drug therapy: Secondary | ICD-10-CM | POA: Insufficient documentation

## 2016-07-31 DIAGNOSIS — E039 Hypothyroidism, unspecified: Secondary | ICD-10-CM | POA: Insufficient documentation

## 2016-07-31 DIAGNOSIS — R197 Diarrhea, unspecified: Secondary | ICD-10-CM | POA: Diagnosis present

## 2016-07-31 DIAGNOSIS — K5900 Constipation, unspecified: Secondary | ICD-10-CM | POA: Diagnosis not present

## 2016-07-31 LAB — CBC WITH DIFFERENTIAL/PLATELET
BASOS ABS: 0 10*3/uL (ref 0.0–0.1)
Basophils Relative: 0 %
Eosinophils Absolute: 0.1 10*3/uL (ref 0.0–0.7)
Eosinophils Relative: 1 %
HEMATOCRIT: 38.2 % (ref 36.0–46.0)
Hemoglobin: 13 g/dL (ref 12.0–15.0)
LYMPHS PCT: 21 %
Lymphs Abs: 2.2 10*3/uL (ref 0.7–4.0)
MCH: 27.4 pg (ref 26.0–34.0)
MCHC: 34 g/dL (ref 30.0–36.0)
MCV: 80.4 fL (ref 78.0–100.0)
MONO ABS: 0.8 10*3/uL (ref 0.1–1.0)
Monocytes Relative: 8 %
NEUTROS ABS: 7.3 10*3/uL (ref 1.7–7.7)
Neutrophils Relative %: 70 %
Platelets: 243 10*3/uL (ref 150–400)
RBC: 4.75 MIL/uL (ref 3.87–5.11)
RDW: 14 % (ref 11.5–15.5)
WBC: 10.3 10*3/uL (ref 4.0–10.5)

## 2016-07-31 LAB — COMPREHENSIVE METABOLIC PANEL
ALBUMIN: 3.9 g/dL (ref 3.5–5.0)
ALT: 6 U/L — ABNORMAL LOW (ref 14–54)
ANION GAP: 9 (ref 5–15)
AST: 14 U/L — ABNORMAL LOW (ref 15–41)
Alkaline Phosphatase: 62 U/L (ref 38–126)
BILIRUBIN TOTAL: 0.5 mg/dL (ref 0.3–1.2)
BUN: 13 mg/dL (ref 6–20)
CO2: 22 mmol/L (ref 22–32)
Calcium: 9.8 mg/dL (ref 8.9–10.3)
Chloride: 104 mmol/L (ref 101–111)
Creatinine, Ser: 0.91 mg/dL (ref 0.44–1.00)
GFR calc Af Amer: >60 mL/min (ref >60)
GFR calc non Af Amer: 57 mL/min — ABNORMAL LOW (ref >60)
GLUCOSE: 104 mg/dL — AB (ref 65–99)
POTASSIUM: 3.4 mmol/L — AB (ref 3.5–5.1)
SODIUM: 135 mmol/L (ref 135–145)
TOTAL PROTEIN: 7.8 g/dL (ref 6.5–8.1)

## 2016-07-31 LAB — POC OCCULT BLOOD, ED: FECAL OCCULT BLD: NEGATIVE

## 2016-07-31 LAB — LIPASE, BLOOD: Lipase: 34 U/L (ref 11–51)

## 2016-07-31 LAB — I-STAT CG4 LACTIC ACID, ED: Lactic Acid, Venous: 1.02 mmol/L (ref 0.5–1.9)

## 2016-07-31 MED ORDER — BISACODYL 10 MG RE SUPP
10.0000 mg | Freq: Once | RECTAL | Status: AC
  Administered 2016-07-31: 10 mg via RECTAL
  Filled 2016-07-31: qty 1

## 2016-07-31 NOTE — ED Notes (Signed)
Enema performed on pt. During the procedure pt refused to hold in the fluid and kept pushing out the fluid. Enema completed to the best of the nurse's ability.  Pt was assisted to bedside commode after enema completion.  Daughter is currently at bedside

## 2016-07-31 NOTE — ED Provider Notes (Signed)
[facility] DEPT Provider Note   CSN: [id number] Arrival date & time: 07/31/16  1443     History   Chief Complaint Chief Complaint  Patient presents with  . Diarrhea    HPI Brianna [name] is a 83 y.o. female.  83 year old female with past medical history including dementia, hypertension, hyperlipidemia who presents with diarrhea and rectal pain. Daughter states that 2 days ago she was having mild diarrhea and PCP told her to give her a dose of Imodium. Yesterday she had normal BM and no problems. This morning, she began saying that she needed to go to the bathroom and daughter noted some liquid stool when she wiped. This has happened several times. Pt has complained of rectal pressure and feeling like she needs to go to the bathroom. Daughter dates that her stool is always dark from taking iron but has not changed in color recently. No associated vomiting or fevers. She is at her mental status baseline. No recent antibiotic use.  LEVEL 5 CAVEAT DUE TO DEMENTIA   The history is provided by a relative.  Diarrhea      Past Medical History:  Diagnosis Date  . Adenomatous colon polyp   . Dementia   . Depression with anxiety   . DUB (dysfunctional uterine bleeding)   . Elevated cholesterol   . Hyperlipidemia   . Hypertension   . Kidney stone   . Sleep apnea   . Thyroid disease    Hypothyroid    Patient Active Problem List   Diagnosis Date Noted  . Pressure injury of skin 06/25/2016  . Hypokalemia 06/24/2016  . Depression 05/06/2016  . Vitamin D deficiency 05/06/2016  . Insomnia 05/06/2016  . Acute metabolic encephalopathy 30/16/0109  . Delirium 04/22/2016  . Leukocytosis 03/19/2015  . AKI (acute kidney injury) ([place]) 03/14/2015  . GERD (gastroesophageal reflux disease) 03/14/2015  . Diaper dermatitis 03/14/2015  . Syncope 03/08/2015  . Physical deconditioning 03/08/2015  . Alzheimer's dementia with behavioral disturbance 10/11/2014  . Iron deficiency anemia  10/26/2013  . Diarrhea 10/26/2013  . Near syncope 09/06/2013  . Hypothyroid 09/06/2013  . Hyponatremia 02/13/2013  . Weakness 02/13/2013  . 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 Hypersomnia with sleep apnea, unspecified 11/17/2012  . DUB (dysfunctional uterine bleeding)   . Hypertension   . Dementia   . Elevated cholesterol   . Kidney stone     Past Surgical History:  Procedure Laterality Date  . ABDOMINAL HYSTERECTOMY    . APPENDECTOMY    . CHOLECYSTECTOMY    . Colon tumor-benign    . kidney stones      OB History    Gravida Para Term Preterm AB Living   3 3 3     3    SAB TAB Ectopic Multiple Live Births                   Home Medications    Prior to Admission medications   Medication Sig Start Date End Date Taking? Authorizing Provider  esomeprazole (NEXIUM) 40 MG capsule Take 40 mg by mouth every morning.    Yes [provider]  galantamine (RAZADYNE ER) 16 MG 24 hr capsule TAKE ONE CAPSULE BY MOUTH EVERY DAY WITH BREAKFAST Patient taking differently: Take 16 mg by mouth daily with breakfast.  10/06/15  Yes Dennie Bible, NP  iron polysaccharides (NU-IRON) 150 MG capsule Take 150-300 mg by mouth See admin instructions. Take 150mg  by mouth in the  morning Take 300mg  by mouth at lunch 06/02/12  Yes [provider]  levothyroxine (SYNTHROID, LEVOTHROID) 100 MCG tablet Take 100 mcg by mouth daily before breakfast.  07/30/15  Yes [provider]  nisoldipine (SULAR) 17 MG 24 hr tablet Take 17 mg by mouth every morning.  03/13/09  Yes [provider]  PRISTIQ 50 MG 24 hr tablet Take 50 mg by mouth every morning.  04/05/13  Yes [provider]  traZODone (DESYREL) 50 MG tablet Take 25 mg by mouth at bedtime.  02/16/13  Yes [provider]  valsartan (DIOVAN) 320 MG tablet Take 320 mg by mouth every morning.    Yes [provider]  Vitamin D, Ergocalciferol, (DRISDOL)  50000 UNITS CAPS capsule Take 50,000 Units by mouth every 7 (seven) days. Tuesday   Yes [provider]  potassium chloride SA (K-DUR,KLOR-CON) 20 MEQ tablet Take 1 tablet (20 mEq total) by mouth daily. Patient not taking: Reported on 07/31/2016 06/25/16   Charlynne Cousins, MD    Family History Family History  Problem Relation Age of Onset  . Diabetes Mother   . Heart disease Father   . Hypertension Brother   . Heart disease Brother   . Diabetes type II Brother   . Diabetes type II Sister   . Thyroid disease Child   . Diabetes Child     Social History Social History  Substance Use Topics  . Smoking status: Never Smoker  . Smokeless tobacco: Never Used  . Alcohol use No     Allergies   Penicillins   Review of Systems Review of Systems  Unable to perform ROS: Dementia  Gastrointestinal: Positive for diarrhea.      Physical Exam Updated Vital Signs BP (!) 108/56 (BP Location: Left Arm)   Pulse 78   Temp 98 F (36.7 C) (Oral)   Resp 18   SpO2 98%   Physical Exam  Constitutional: She appears well-developed. No distress.  Frail, elderly woman awake and alert  HENT:  Head: Normocephalic and atraumatic.  Moist mucous membranes  Eyes: Conjunctivae are normal. Pupils are equal, round, and reactive to light.  Neck: Neck supple.  Cardiovascular: Normal rate, regular rhythm and normal heart sounds.   No murmur heard. Pulmonary/Chest: Effort normal and breath sounds normal.  Abdominal: Soft. Bowel sounds are normal. She exhibits no distension. There is no tenderness.  Genitourinary:  Genitourinary Comments: Large amount of dark stool in rectal vault, large external, non thrombosed and non-bleeding hemorrhoids  Musculoskeletal: She exhibits no edema.  Neurological: She is alert.  Moving all 4 extremities equally, no facial asymmetry  Skin: Skin is warm and dry.  Nursing note and vitals reviewed.    ED Treatments / Results  Labs (all labs ordered are  listed, but only abnormal results are displayed) Labs Reviewed  COMPREHENSIVE METABOLIC PANEL - Abnormal; Notable for the following:       Result Value   Potassium 3.4 (*)    Glucose, Bld 104 (*)    AST 14 (*)    ALT 6 (*)    GFR calc non Af Amer 57 (*)    All other components within normal limits  CBC WITH DIFFERENTIAL/PLATELET  LIPASE, BLOOD  I-STAT CG4 LACTIC ACID, ED  POC OCCULT BLOOD, ED    EKG  EKG Interpretation None       Radiology No results found.  Procedures Procedures (including critical care time)  Medications Ordered in ED Medications  bisacodyl (DULCOLAX) suppository 10  mg (10 mg Rectal Given 07/31/16 1754)     Initial Impression / Assessment and Plan / ED Course  I have reviewed the triage vital signs and the nursing notes.  Pertinent labs & imaging results that were available during my care of the patient were reviewed by me and considered in my medical decision making (see chart for details).     PT Got in by daughter for complaints of leaking stool, 2 days ago was given Imodium for diarrhea. She had no abdominal tenderness on exam and reassuring vital signs. She had a large amount of stool in rectal vault and I suspect she may be having encopresis due to constipation. Gave enema followed later by Dulcolax suppository. Patient was later able to have a bowel movement and daughter stated that she would appeared much more comfortable. On reexamination she was laying comfortably without any complaints. Screening lab work was unremarkable. I discussed supportive measures including MiraLAX and Colace and reviewed return precautions. Patient discharged in satisfactory condition.  Final Clinical Impressions(s) / ED Diagnoses   Final diagnoses:  Constipation, unspecified constipation type    New Prescriptions New Prescriptions   No medications on file     Girard Koontz, Wenda Overland, MD 07/31/16 [phone]

## 2016-07-31 NOTE — ED Notes (Signed)
Bedside commode placed in pt room  

## 2016-07-31 NOTE — ED Triage Notes (Signed)
Pt reports ongoing diarrhea since this am accompanied by rectal pain. No n/v. Hx of dementia, accompanied by daughter.

## 2016-08-08 DIAGNOSIS — I10 Essential (primary) hypertension: Secondary | ICD-10-CM | POA: Diagnosis not present

## 2016-08-08 DIAGNOSIS — G309 Alzheimer's disease, unspecified: Secondary | ICD-10-CM | POA: Diagnosis not present

## 2016-08-08 DIAGNOSIS — M5031 Other cervical disc degeneration,  high cervical region: Secondary | ICD-10-CM | POA: Diagnosis not present

## 2016-08-08 DIAGNOSIS — Z86010 Personal history of colonic polyps: Secondary | ICD-10-CM | POA: Diagnosis not present

## 2016-08-08 DIAGNOSIS — F418 Other specified anxiety disorders: Secondary | ICD-10-CM | POA: Diagnosis not present

## 2016-08-08 DIAGNOSIS — F0281 Dementia in other diseases classified elsewhere with behavioral disturbance: Secondary | ICD-10-CM | POA: Diagnosis not present

## 2016-08-28 DIAGNOSIS — E038 Other specified hypothyroidism: Secondary | ICD-10-CM | POA: Diagnosis not present

## 2016-08-28 DIAGNOSIS — E876 Hypokalemia: Secondary | ICD-10-CM | POA: Diagnosis not present

## 2016-08-28 DIAGNOSIS — E784 Other hyperlipidemia: Secondary | ICD-10-CM | POA: Diagnosis not present

## 2016-08-28 DIAGNOSIS — Z6820 Body mass index (BMI) 20.0-20.9, adult: Secondary | ICD-10-CM | POA: Diagnosis not present

## 2016-08-28 DIAGNOSIS — D510 Vitamin B12 deficiency anemia due to intrinsic factor deficiency: Secondary | ICD-10-CM | POA: Diagnosis not present

## 2016-08-28 DIAGNOSIS — E559 Vitamin D deficiency, unspecified: Secondary | ICD-10-CM | POA: Diagnosis not present

## 2016-08-28 DIAGNOSIS — G309 Alzheimer's disease, unspecified: Secondary | ICD-10-CM | POA: Diagnosis not present

## 2016-08-28 DIAGNOSIS — D649 Anemia, unspecified: Secondary | ICD-10-CM | POA: Diagnosis not present

## 2016-09-10 ENCOUNTER — Ambulatory Visit (INDEPENDENT_AMBULATORY_CARE_PROVIDER_SITE_OTHER): Payer: Medicare Other | Admitting: Neurology

## 2016-09-10 ENCOUNTER — Encounter: Payer: Self-pay | Admitting: Neurology

## 2016-09-10 VITALS — BP 122/65 | HR 61 | Ht 60.0 in | Wt 112.0 lb

## 2016-09-10 DIAGNOSIS — G309 Alzheimer's disease, unspecified: Secondary | ICD-10-CM | POA: Diagnosis not present

## 2016-09-10 DIAGNOSIS — E871 Hypo-osmolality and hyponatremia: Secondary | ICD-10-CM | POA: Diagnosis not present

## 2016-09-10 DIAGNOSIS — F0281 Dementia in other diseases classified elsewhere with behavioral disturbance: Secondary | ICD-10-CM

## 2016-09-10 NOTE — Progress Notes (Signed)
[facility]  PATIENT: Brianna [name] DOB: [birth year]   REASON FOR VISIT: Follow-up for Alzheimer's disease, tension headaches HISTORY FROM: Daughter    HISTORY OF PRESENT ILLNESS:    83 year old caucasian female, who is a long established patient in our practice and carries the diagnosis of dementia, . MRI 2015 showed Mesio-temporal atrophy indicative of memory loss disorder , As in Alzheimer's. small vessel disease has progressed since 2007. She has been on Excelon and Ambien and could not tolerate it, changed to Galantamine ER at 16 mg and she has not had side effects. Last EEG 2013 was remarkably normal .  Brianna [name] is no longer independent with ADL's.She requires 24/7 assistance . She is more and ore unable  to walk. Daughter requests PT for gait training.  Her Mini-Mental status exam score was the same from 2014 through 2016 at 20 out of 30.  Now declined to 8 out of 30. She no longer recognizes her daughter. She is unable to follow commands. She stated " I don;t have a husband" but she lives with her spouse. Repeatedly stated ' my back is killing me " and " I  can't understand what you said " She takes trazodone for sleep.    MMSE - Mini Mental State Exam 09/10/2016 10/06/2015 06/28/2015  Orientation to time 0 1 0  Orientation to Place 1 0 2  Registration 1 3 2   Attention/ Calculation 0 0 2  Attention/Calculation-comments pt didnt even try - -  Recall 0 0 0  Language- name 2 objects 2 1 2   Language- repeat 1 1 1   Language- follow 3 step command 3 3 3   Language- read & follow direction 0 0 0  Write a sentence 0 0 0  Write a sentence-comments pt didnt even try - -  Copy design 0 0 0  Copy design-comments pt said she couldnt see it to draw - -  Total score 8 9 12      REVIEW OF SYSTEMS: Full 14 system review of systems performed and notable only for those listed, all others are neg:  Brianna [name] has continued to suffer memory loss and she  scored only 10 points in a Mini-Mental Status Examination today on 02/20/2015. 2 in the animal fluency test unable to draw clock face unable to copy  an image she was partially able to write a sentence. She is using diapers now at night. She is less confused since her urinary tract infection was treated, she also seems to sleep better and through the night. However nocturnal incontinence has also developed recently. She has swollen fingers and the dorsal manners, I wonder if she may indeed have gout as her daughter suspected she seems to have pain over the thenar eminence and at the thenar joint. She denies toe pain.   ALLERGIES: Allergies  Allergen Reactions  . Penicillins Other (See Comments)    "didn't sit well" Has patient had a PCN reaction causing immediate rash, facial/tongue/throat swelling, SOB or lightheadedness with hypotension: No Has patient had a PCN reaction causing severe rash involving mucus membranes or skin necrosis: No Has patient had a PCN reaction that required hospitalization No Has patient had a PCN reaction occurring within the last 10 years: No If all of the above answers are "NO", then may proceed with Cephalosporin use.    HOME MEDICATIONS: Outpatient Medications Prior to Visit  Medication Sig Dispense Refill  . esomeprazole (NEXIUM) 40 MG capsule Take 40 mg by mouth  every morning.     . galantamine (RAZADYNE ER) 16 MG 24 hr capsule TAKE ONE CAPSULE BY MOUTH EVERY DAY WITH BREAKFAST (Patient taking differently: Take 16 mg by mouth daily with breakfast. ) 30 capsule 6  . iron polysaccharides (NU-IRON) 150 MG capsule Take 150-300 mg by mouth See admin instructions. Take 150mg  by mouth in the morning Take 300mg  by mouth at lunch    . levothyroxine (SYNTHROID, LEVOTHROID) 100 MCG tablet Take 100 mcg by mouth daily before breakfast.   5  . nisoldipine (SULAR) 17 MG 24 hr tablet Take 17 mg by mouth every morning.     . potassium chloride SA (K-DUR,KLOR-CON) 20 MEQ tablet  Take 1 tablet (20 mEq total) by mouth daily. 30 tablet 0  . PRISTIQ 50 MG 24 hr tablet Take 50 mg by mouth every morning.     . traZODone (DESYREL) 50 MG tablet Take 25 mg by mouth at bedtime.     . valsartan (DIOVAN) 320 MG tablet Take 320 mg by mouth every morning.     . Vitamin D, Ergocalciferol, (DRISDOL) 50000 UNITS CAPS capsule Take 50,000 Units by mouth every 7 (seven) days. Tuesday     No facility-administered medications prior to visit.     PAST MEDICAL HISTORY: Past Medical History:  Diagnosis Date  . Adenomatous colon polyp   . Dementia   . Depression with anxiety   . DUB (dysfunctional uterine bleeding)   . Elevated cholesterol   . Hyperlipidemia   . Hypertension   . Kidney stone   . Sleep apnea   . Thyroid disease    Hypothyroid    PAST SURGICAL HISTORY: Past Surgical History:  Procedure Laterality Date  . ABDOMINAL HYSTERECTOMY    . APPENDECTOMY    . CHOLECYSTECTOMY    . Colon tumor-benign    . kidney stones      FAMILY HISTORY: Family History  Problem Relation Age of Onset  . Diabetes Mother   . Heart disease Father   . Hypertension Brother   . Heart disease Brother   . Diabetes type II Brother   . Diabetes type II Sister   . Thyroid disease Child   . Diabetes Child     SOCIAL HISTORY: Social History   Social History  . Marital status: Married    Spouse name: [name]  . Number of children: 4  . Years of education: 8   Occupational History  .  Retired   Social History Main Topics  . Smoking status: Never Smoker  . Smokeless tobacco: Never Used  . Alcohol use No  . Drug use: No  . Sexual activity: No   Other Topics Concern  . Not on file   Social History Narrative   Patient is married [name]).   Patient has four children.   Patient has an 8th grade education.   Patient is retired.   Patient is right-handed.   Patient drinks 3- 2 liters of Pepsi daily.     PHYSICAL EXAM  Vitals:   09/10/16 1310  BP: 122/65  Pulse: 61    Weight: 112 lb (50.8 kg)  Height: 5' (1.524 m)   Body mass index is 21.87 kg/m.  Generalized: Well developed, in no acute distress , gained weight from 120 pounds  6 years ago.  Head: normocephalic and atraumatic,. Oropharynx benign , no TMJ pain, she  is without teeth and lost her implanted teeth as well. Osteopenia and osteoporosis related jaw changes.  Neck: Supple, no  carotid bruits  Cardiac: Regular rate rhythm, no murmur    Neurological examination   Mentation: MMSE 08-30 points, last stages of alzheimer's dementia. Still cared for at home.   Cranial nerves ; the patient denies loss of  smell or taste sensation, but is very disoriented.  Pupils were equal round reactive to light , unable to obtain a visual field.  She has oral facial automatisms.  Uvula and  tongue moving in midline. head turning and shoulder shrug were limited.  Motor: normal bulk and tone, no pronator drift. No focal weakness, has swollen hands and fingers, not feet and toes. Seems to be sensitive to touch .  Gait and Station: Rising up from seated position witht assistance, stooped posture, shuffling gait.    DIAGNOSTIC DATA (LABS, IMAGING, TESTING) - I reviewed patient records, labs, notes, testing and imaging myself where available.  Lab Results  Component Value Date   VITAMINB12 243 04/22/2016   Lab Results  Component Value Date   TSH 2.465 06/24/2016    ASSESSMENT AND PLAN  83 y.o. year old female  has a past medical history of Dementia of Alzheimer's disease as well as Vascular Dementia component. The patient has a severe dementia. ;   MRI brain showed Mesio-temporal atrophy indicative of memory loss disorder in 2015  . Her primary care physician has restricted her fluid intake to help her preserve a normal sodium level.  For while she had been seen at the hospital ED every other week with hyponatremia related spells of loss of consciousness, awareness changes, spells that looked like  seizures.  Continue Razadyne at current dose, I  will refill for memory loss. We will monitor MMSE every 6 months Perform physical therapy exercises daily, this will decrease risk of falls Walk for overall health and well-being. Daughter initiates daily exercises.        25 minute visit with more than 50% of face to face time designated to discuss caregiver burden with her daughter. Brianna [name] daughter has been able to maintain the fluid restriction which has kept her mother from having to go back to the hospital . She had one ED visit after constipation and was given enemas.  The sodium levels have normalized. The Mini-Mental Status Examination is stable yet there is a lack of spontaneous speech that tells me that her dementia has progressed- she has oral automatisms, has been staring off. Her gait is also somewhat restricted and she does not have some normal rotation ability and appears rigid en bloc. Suspected is no longer enjoy riding in the car or joining her daughter on outings. It would be nice if she could at least walks through the stores or the shopping mall on an even floor-  perhaps with a walker or shopping cart in front of her. Her daughter will implement some of these changes. There is little financial wiggle room for respite care.  Her daughter dresses and bathes is the patient, she feeds herself not longer since early 2017 ! Marland Kitchenthe patient was aggressive towards the paid  household help, no respite time for  her daughter.   Referral for social worker services.Encourage moderate diet and hydration. Labs reviewed.   Follow-up in 6 months with Dennie Bible, [facility]    Larey Seat, MD   [facility] [address] [place], [place] [address] [phone]

## 2016-09-10 NOTE — Patient Instructions (Signed)
Alzheimer Disease Caregiver Guide A person who has Alzheimer disease may not be able to take care of himself or herself. He or she may need help with simple tasks. The tips below can help you care for the person. Memory loss and confusion If the person is confused or cannot remember things:  Stay calm.  Respond with a short answer.  Avoid correcting him or her in a way that sounds like scolding.  Try not to take it personally, even if he or she forgets your name.  Behavior changes The person may go through behavior changes. This can include depression, anxiety, anger, or seeing things that are not there. When behavior changes:  Try not to take behavior changes personally.  Stay calm and patient.  Do not argue or try to convince the person about a specific point.  Know that these changes are part of the disease process. Try to work through it.  Tips to lessen frustration  Make appointments and do daily tasks when the person is at his or her best.  Take your time. Simple tasks may take longer. Allow plenty of time to complete tasks.  Limit choices for the person.  Involve the person in what you are doing.  Stick to a routine.  Avoid new or crowded places, if possible.  Use simple words, short sentences, and a calm voice. Only give 1 direction at a time.  Buy clothes and shoes that are easy to put on and take off.  Let people help if they offer. Home safety  Keep floors clear. Remove rugs, magazine racks, and floor lamps.  Keep hallways well lit.  Put a handrail and nonslip mat in the bathtub or shower.  Put childproof locks on cabinets that have dangerous items in them. These items include medicine, alcohol, guns, toxic cleaning items, sharp tools, matches, or lighters.  Place locks on doors where the person cannot see or reach them. This helps the person to not wander out of the house and get lost.  Be prepared for emergencies. Keep a list of emergency phone  numbers and addresses in a handy area. Plans for the future  Talk about finances. ? Talk about money management. People with Alzheimer disease have trouble managing their money as the disease gets worse. ? Get help from professional advisors about financial and legal matters.  Talk about future care. ? Choose a power of attorney. This is someone who can make decisions for the person with Alzheimer disease when he or she can no longer do so. ? Talk about driving and when it is the right time to stop. The person's doctor can help with this. ? If the person lives alone, make sure he or she is safe. Some people need extra help at home. Other people need more care at a nursing home or care center. Support groups Some benefits of joining a support group include:  Learning ways to manage stress.  Sharing experiences with others.  Getting emotional comfort and support.  Learning new caregiving skills as the disease progresses.  Knowing what community resources are available and taking advantage of them.  Get help if:  The person has a fever.  The person has a sudden behavior change that does not get better with calming strategies.  The person is unable to manage his or her living situation.  The person threatens you or anyone else, including himself or herself.  You are no longer able to care for the person. This information is not   intended to replace advice given to you by your health care provider. Make sure you discuss any questions you have with your health care provider. Document Released: 04/08/2011 Document Revised: 06/22/2015 Document Reviewed: 03/06/2011 Elsevier Interactive Patient Education  2017 Elsevier Inc.  

## 2016-11-15 DIAGNOSIS — K649 Unspecified hemorrhoids: Secondary | ICD-10-CM | POA: Diagnosis not present

## 2016-11-15 DIAGNOSIS — K6289 Other specified diseases of anus and rectum: Secondary | ICD-10-CM | POA: Diagnosis not present

## 2016-11-15 DIAGNOSIS — Z23 Encounter for immunization: Secondary | ICD-10-CM | POA: Diagnosis not present

## 2016-11-15 DIAGNOSIS — K5900 Constipation, unspecified: Secondary | ICD-10-CM | POA: Diagnosis not present

## 2017-01-06 ENCOUNTER — Inpatient Hospital Stay (HOSPITAL_COMMUNITY)
Admission: EM | Admit: 2017-01-06 | Discharge: 2017-01-09 | DRG: 071 | Disposition: A | Discharge status: Skilled Nursing Facility | Payer: Medicare Other | Attending: Internal Medicine | Admitting: Internal Medicine

## 2017-01-06 ENCOUNTER — Other Ambulatory Visit: Payer: Self-pay

## 2017-01-06 ENCOUNTER — Emergency Department (HOSPITAL_COMMUNITY): Payer: Medicare Other

## 2017-01-06 ENCOUNTER — Encounter (HOSPITAL_COMMUNITY): Payer: Self-pay

## 2017-01-06 DIAGNOSIS — G9341 Metabolic encephalopathy: Secondary | ICD-10-CM | POA: Diagnosis present

## 2017-01-06 DIAGNOSIS — Z79890 Hormone replacement therapy (postmenopausal): Secondary | ICD-10-CM

## 2017-01-06 DIAGNOSIS — F02818 Dementia in other diseases classified elsewhere, unspecified severity, with other behavioral disturbance: Secondary | ICD-10-CM | POA: Diagnosis present

## 2017-01-06 DIAGNOSIS — Z880 Allergy status to penicillin: Secondary | ICD-10-CM

## 2017-01-06 DIAGNOSIS — E039 Hypothyroidism, unspecified: Secondary | ICD-10-CM | POA: Diagnosis not present

## 2017-01-06 DIAGNOSIS — F23 Brief psychotic disorder: Secondary | ICD-10-CM | POA: Diagnosis not present

## 2017-01-06 DIAGNOSIS — F6389 Other impulse disorders: Secondary | ICD-10-CM | POA: Diagnosis not present

## 2017-01-06 DIAGNOSIS — G309 Alzheimer's disease, unspecified: Secondary | ICD-10-CM | POA: Diagnosis present

## 2017-01-06 DIAGNOSIS — E876 Hypokalemia: Secondary | ICD-10-CM | POA: Diagnosis not present

## 2017-01-06 DIAGNOSIS — I10 Essential (primary) hypertension: Secondary | ICD-10-CM | POA: Diagnosis present

## 2017-01-06 DIAGNOSIS — F0390 Unspecified dementia without behavioral disturbance: Secondary | ICD-10-CM | POA: Diagnosis present

## 2017-01-06 DIAGNOSIS — R4020 Unspecified coma: Secondary | ICD-10-CM | POA: Diagnosis not present

## 2017-01-06 DIAGNOSIS — G9340 Encephalopathy, unspecified: Secondary | ICD-10-CM | POA: Diagnosis present

## 2017-01-06 DIAGNOSIS — R410 Disorientation, unspecified: Secondary | ICD-10-CM | POA: Diagnosis not present

## 2017-01-06 DIAGNOSIS — F0281 Dementia in other diseases classified elsewhere with behavioral disturbance: Secondary | ICD-10-CM | POA: Diagnosis present

## 2017-01-06 DIAGNOSIS — G301 Alzheimer's disease with late onset: Secondary | ICD-10-CM | POA: Diagnosis not present

## 2017-01-06 DIAGNOSIS — R9431 Abnormal electrocardiogram [ECG] [EKG]: Secondary | ICD-10-CM | POA: Diagnosis not present

## 2017-01-06 DIAGNOSIS — Z79899 Other long term (current) drug therapy: Secondary | ICD-10-CM

## 2017-01-06 HISTORY — DX: Encephalopathy, unspecified: G93.40

## 2017-01-06 LAB — URINALYSIS, COMPLETE (UACMP) WITH MICROSCOPIC
BACTERIA UA: NONE SEEN
Bilirubin Urine: NEGATIVE
Glucose, UA: NEGATIVE mg/dL
Ketones, ur: NEGATIVE mg/dL
Leukocytes, UA: NEGATIVE
Nitrite: NEGATIVE
Protein, ur: NEGATIVE mg/dL
SPECIFIC GRAVITY, URINE: 1.004 — AB (ref 1.005–1.030)
SQUAMOUS EPITHELIAL / LPF: NONE SEEN
pH: 6 (ref 5.0–8.0)

## 2017-01-06 LAB — CBC
HCT: 38.4 % (ref 36.0–46.0)
HEMOGLOBIN: 13.1 g/dL (ref 12.0–15.0)
MCH: 28.9 pg (ref 26.0–34.0)
MCHC: 34.1 g/dL (ref 30.0–36.0)
MCV: 84.6 fL (ref 78.0–100.0)
Platelets: 243 10*3/uL (ref 150–400)
RBC: 4.54 MIL/uL (ref 3.87–5.11)
RDW: 13.3 % (ref 11.5–15.5)
WBC: 7.7 10*3/uL (ref 4.0–10.5)

## 2017-01-06 LAB — COMPREHENSIVE METABOLIC PANEL
ALT: 8 U/L — ABNORMAL LOW (ref 14–54)
ANION GAP: 8 (ref 5–15)
AST: 18 U/L (ref 15–41)
Albumin: 4.1 g/dL (ref 3.5–5.0)
Alkaline Phosphatase: 58 U/L (ref 38–126)
BUN: 12 mg/dL (ref 6–20)
CHLORIDE: 101 mmol/L (ref 101–111)
CO2: 27 mmol/L (ref 22–32)
Calcium: 9.6 mg/dL (ref 8.9–10.3)
Creatinine, Ser: 0.88 mg/dL (ref 0.44–1.00)
GFR calc non Af Amer: 60 mL/min — ABNORMAL LOW (ref >60)
Glucose, Bld: 87 mg/dL (ref 65–99)
POTASSIUM: 2.2 mmol/L — AB (ref 3.5–5.1)
SODIUM: 136 mmol/L (ref 135–145)
Total Bilirubin: 0.4 mg/dL (ref 0.3–1.2)
Total Protein: 7.9 g/dL (ref 6.5–8.1)

## 2017-01-06 LAB — MAGNESIUM: Magnesium: 1.9 mg/dL (ref 1.7–2.4)

## 2017-01-06 LAB — AMMONIA: AMMONIA: 19 umol/L (ref 9–35)

## 2017-01-06 MED ORDER — VENLAFAXINE HCL ER 37.5 MG PO CP24
37.5000 mg | ORAL_CAPSULE | Freq: Every day | ORAL | Status: DC
  Administered 2017-01-08 – 2017-01-09 (×2): 37.5 mg via ORAL
  Filled 2017-01-06 (×3): qty 1

## 2017-01-06 MED ORDER — LEVOTHYROXINE SODIUM 100 MCG PO TABS
100.0000 ug | ORAL_TABLET | Freq: Every day | ORAL | Status: DC
  Administered 2017-01-08 – 2017-01-09 (×2): 100 ug via ORAL
  Filled 2017-01-06 (×3): qty 1

## 2017-01-06 MED ORDER — MAGNESIUM SULFATE IN D5W 1-5 GM/100ML-% IV SOLN
1.0000 g | Freq: Once | INTRAVENOUS | Status: DC
  Filled 2017-01-06: qty 100

## 2017-01-06 MED ORDER — POTASSIUM CHLORIDE CRYS ER 20 MEQ PO TBCR
40.0000 meq | EXTENDED_RELEASE_TABLET | Freq: Once | ORAL | Status: AC
  Administered 2017-01-06: 40 meq via ORAL
  Filled 2017-01-06: qty 2

## 2017-01-06 MED ORDER — HALOPERIDOL LACTATE 5 MG/ML IJ SOLN
2.0000 mg | Freq: Every evening | INTRAMUSCULAR | Status: DC | PRN
  Administered 2017-01-08: 2 mg via INTRAVENOUS
  Filled 2017-01-06: qty 1

## 2017-01-06 MED ORDER — ACETAMINOPHEN 650 MG RE SUPP: 650.0000 mg | Freq: Four times a day (QID) | RECTAL | Status: DC | PRN

## 2017-01-06 MED ORDER — PANTOPRAZOLE SODIUM 40 MG PO TBEC
40.0000 mg | DELAYED_RELEASE_TABLET | Freq: Every day | ORAL | Status: DC
  Administered 2017-01-08 – 2017-01-09 (×2): 40 mg via ORAL
  Filled 2017-01-06 (×3): qty 1

## 2017-01-06 MED ORDER — ENOXAPARIN SODIUM 40 MG/0.4ML ~~LOC~~ SOLN
40.0000 mg | SUBCUTANEOUS | Status: DC
  Administered 2017-01-06 – 2017-01-08 (×2): 40 mg via SUBCUTANEOUS
  Filled 2017-01-06 (×3): qty 0.4

## 2017-01-06 MED ORDER — MAGNESIUM SULFATE 2 GM/50ML IV SOLN
2.0000 g | Freq: Once | INTRAVENOUS | Status: AC
  Administered 2017-01-07: 2 g via INTRAVENOUS
  Filled 2017-01-06: qty 50

## 2017-01-06 MED ORDER — SODIUM CHLORIDE 0.9 % IV SOLN
INTRAVENOUS | Status: DC
  Administered 2017-01-06: 14:00:00 via INTRAVENOUS

## 2017-01-06 MED ORDER — POTASSIUM CHLORIDE 10 MEQ/100ML IV SOLN: 10.0000 meq | INTRAVENOUS | Status: AC

## 2017-01-06 MED ORDER — POTASSIUM CHLORIDE 10 MEQ/100ML IV SOLN
10.0000 meq | INTRAVENOUS | Status: AC
  Administered 2017-01-06 – 2017-01-07 (×4): 10 meq via INTRAVENOUS
  Filled 2017-01-06 (×4): qty 100

## 2017-01-06 MED ORDER — MAGNESIUM SULFATE IN D5W 1-5 GM/100ML-% IV SOLN: 1.0000 g | Freq: Once | INTRAVENOUS | Status: DC

## 2017-01-06 MED ORDER — ACETAMINOPHEN 325 MG PO TABS
650.0000 mg | ORAL_TABLET | Freq: Four times a day (QID) | ORAL | Status: DC | PRN
  Administered 2017-01-07: 650 mg via ORAL
  Filled 2017-01-06: qty 2

## 2017-01-06 MED ORDER — SODIUM CHLORIDE 0.9 % IV SOLN
INTRAVENOUS | Status: DC
  Administered 2017-01-06 – 2017-01-08 (×2): via INTRAVENOUS

## 2017-01-06 MED ORDER — IRBESARTAN 300 MG PO TABS: 300.0000 mg | ORAL_TABLET | Freq: Every day | ORAL | Status: DC

## 2017-01-06 MED ORDER — LOSARTAN POTASSIUM 50 MG PO TABS
100.0000 mg | ORAL_TABLET | Freq: Every day | ORAL | Status: DC
  Administered 2017-01-08 – 2017-01-09 (×2): 100 mg via ORAL
  Filled 2017-01-06 (×3): qty 2

## 2017-01-06 NOTE — ED Notes (Signed)
Pt daughter called to get update on pt status.

## 2017-01-06 NOTE — ED Provider Notes (Signed)
[facility] DEPT Provider Note   CSN: [id number] Arrival date & time: 01/06/17  1312     History   Chief Complaint Chief Complaint  Patient presents with  . Altered Mental Status    HPI Brianna [name] is a 83 y.o. female.  HPI Patient presents to the emergency room for evaluation of altered mental status.  Patient was brought in by EMS.  Patient lives at home according to the report and the family called because she has been argumentative and combative.  EMS reports the patient was given midazolam 5 mg IM because she was combative.  On my exam the patient is very sleepy and is not able to answer my questions or provide any history. Past Medical History:  Diagnosis Date  . Adenomatous colon polyp   . Dementia   . Depression with anxiety   . DUB (dysfunctional uterine bleeding)   . Elevated cholesterol   . Hyperlipidemia   . Hypertension   . Kidney stone   . Sleep apnea   . Thyroid disease    Hypothyroid    Patient Active Problem List   Diagnosis Date Noted  . Pressure injury of skin 06/25/2016  . Hypokalemia 06/24/2016  . Depression 05/06/2016  . Vitamin D deficiency 05/06/2016  . Insomnia 05/06/2016  . Acute metabolic encephalopathy 27/74/1287  . Delirium 04/22/2016  . Leukocytosis 03/19/2015  . AKI (acute kidney injury) ([facility]) 03/14/2015  . GERD (gastroesophageal reflux disease) 03/14/2015  . Diaper dermatitis 03/14/2015  . Syncope 03/08/2015  . Physical deconditioning 03/08/2015  . Alzheimer's dementia with behavioral disturbance 10/11/2014  . Iron deficiency anemia 10/26/2013  . Diarrhea 10/26/2013  . Near syncope 09/06/2013  . Hypothyroid 09/06/2013  . Hyponatremia 02/13/2013  . Weakness 02/13/2013  . 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 Hypersomnia with sleep apnea, unspecified 11/17/2012  . DUB (dysfunctional uterine bleeding)   . Hypertension   . Dementia   . Elevated  cholesterol   . Kidney stone     Past Surgical History:  Procedure Laterality Date  . ABDOMINAL HYSTERECTOMY    . APPENDECTOMY    . CHOLECYSTECTOMY    . Colon tumor-benign    . kidney stones      OB History    Gravida Para Term Preterm AB Living   3 3 3     3    SAB TAB Ectopic Multiple Live Births                   Home Medications    Prior to Admission medications   Medication Sig Start Date End Date Taking? Authorizing Provider  esomeprazole (NEXIUM) 40 MG capsule Take 40 mg by mouth every morning.     [provider]  iron polysaccharides (NU-IRON) 150 MG capsule Take 150 mg by mouth See admin instructions. Take 150mg  by mouth in the morning Take 300mg  by mouth at lunch 06/02/12   [provider]  levothyroxine (SYNTHROID, LEVOTHROID) 100 MCG tablet Take 100 mcg by mouth daily before breakfast.  07/30/15   [provider]  nisoldipine (SULAR) 17 MG 24 hr tablet Take 17 mg by mouth every morning.  03/13/09   [provider]  potassium chloride SA (K-DUR,KLOR-CON) 20 MEQ tablet Take 1 tablet (20 mEq total) by mouth daily. 06/25/16   Charlynne Cousins, MD  PRISTIQ 50 MG 24 hr tablet Take 50 mg by mouth every morning.  04/05/13   [provider]  traZODone (DESYREL) 50 MG tablet Take 25 mg by mouth at bedtime.  02/16/13   [provider]  valsartan (DIOVAN) 320 MG tablet Take 320 mg by mouth every morning.     [provider]  Vitamin D, Ergocalciferol, (DRISDOL) 50000 UNITS CAPS capsule Take 50,000 Units by mouth every 7 (seven) days. Tuesday    [provider]    Family History Family History  Problem Relation Age of Onset  . Diabetes Mother   . Heart disease Father   . Hypertension Brother   . Heart disease Brother   . Diabetes type II Brother   . Diabetes type II Sister   . Thyroid disease Child   . Diabetes Child     Social History Social History   Tobacco Use  . Smoking status: Never Smoker    . Smokeless tobacco: Never Used  Substance Use Topics  . Alcohol use: No  . Drug use: No     Allergies   Penicillins   Review of Systems Review of Systems  All other systems reviewed and are negative.    Physical Exam Updated Vital Signs BP 136/79 (BP Location: Right Arm)   Pulse 65   Temp 98.3 F (36.8 C)   Resp 18   SpO2 95%   Physical Exam  Constitutional: She appears lethargic. No distress.  HENT:  Head: Normocephalic and atraumatic.  Right Ear: External ear normal.  Left Ear: External ear normal.  Eyes: Conjunctivae are normal. Right eye exhibits no discharge. Left eye exhibits no discharge. No scleral icterus.  Neck: Neck supple. No tracheal deviation present.  Cardiovascular: Normal rate, regular rhythm and intact distal pulses.  Pulmonary/Chest: Effort normal and breath sounds normal. No stridor. No respiratory distress. She has no wheezes. She has no rales.  Abdominal: Soft. Bowel sounds are normal. She exhibits no distension. There is no tenderness. There is no rebound and no guarding.  Musculoskeletal: She exhibits no edema or tenderness.  Neurological: She appears lethargic. No cranial nerve deficit (no facial droop, extraocular movements intact, no slurred speech) or sensory deficit. She exhibits normal muscle tone. She displays no seizure activity. Coordination normal. GCS eye subscore is 4. GCS verbal subscore is 4. GCS motor subscore is 6.  Generalized weakness, patient does not follow commands insistently, she does move both of her hands, I have seen her move her feet  Skin: Skin is warm and dry. No rash noted. She is not diaphoretic.  Psychiatric: She has a normal mood and affect.  Nursing note and vitals reviewed.    ED Treatments / Results  Labs (all labs ordered are listed, but only abnormal results are displayed) Labs Reviewed  COMPREHENSIVE METABOLIC PANEL - Abnormal; Notable for the following components:      Result Value   Potassium 2.2  (*)    ALT 8 (*)    GFR calc non Af Amer 60 (*)    All other components within normal limits  URINALYSIS, COMPLETE (UACMP) WITH MICROSCOPIC - Abnormal; Notable for the following components:   Color, Urine STRAW (*)    Specific Gravity, Urine 1.004 (*)    Hgb urine dipstick SMALL (*)    All other components within normal limits  CBC  AMMONIA  CBG MONITORING, ED  CBG MONITORING, ED    EKG  EKG Interpretation  Date/Time:  Monday January 06 2017 14:35:51 EST Ventricular Rate:  60 PR Interval:    QRS Duration: 89 QT Interval:  437 QTC Calculation: 437 R  Axis:   5 Text Interpretation:  Sinus rhythm Low voltage, precordial leads Baseline wander in lead(s) I No significant change since last tracing Confirmed by Dorie Rank [phone]) on 01/06/2017 2:40:25 PM       Radiology Dg Chest 1 View  Result Date: 01/06/2017 CLINICAL DATA:  Confusion, uncooperative, altered mental status, history hypertension, dementia EXAM: CHEST 1 VIEW COMPARISON:  Portable exam 1356 hours compared to 06/24/2016 FINDINGS: Upper normal size of cardiac silhouette. Mediastinal contours and pulmonary vascularity normal. Lungs clear. No pleural effusion or pneumothorax. Bones unremarkable. IMPRESSION: No acute abnormalities. Electronically Signed   By: Lavonia Dana M.D.   On: 01/06/2017 14:10   Ct Head Wo Contrast  Result Date: 01/06/2017 CLINICAL DATA:  Altered level of consciousness, combative, artery meta diff, history dementia, hypertension EXAM: CT HEAD WITHOUT CONTRAST TECHNIQUE: Contiguous axial images were obtained from the base of the skull through the vertex without intravenous contrast. Sagittal and coronal MPR images reconstructed from axial data set. COMPARISON:  04/22/2016 FINDINGS: Brain: Mild motion artifacts. Generalized atrophy. Normal ventricular morphology. No midline shift or mass effect. Small vessel chronic ischemic changes of deep cerebral white matter. No intracranial hemorrhage, mass lesion or  evidence of acute infarction. No extra-axial fluid collections. Vascular: Unremarkable Skull: Normal appearance Sinuses/Orbits: Small amount chronic fluid within the LEFT sphenoid sinus. Remaining visualized paranasal sinuses and mastoid air cells clear Other: N/A IMPRESSION: Atrophy with small vessel chronic ischemic changes of deep cerebral white matter. No acute intracranial abnormalities. Electronically Signed   By: Lavonia Dana M.D.   On: 01/06/2017 14:07    Procedures Procedures (including critical care time)  Medications Ordered in ED Medications  0.9 %  sodium chloride infusion ( Intravenous New Bag/Given 01/06/17 1422)  potassium chloride 10 mEq in 100 mL IVPB (not administered)  potassium chloride SA (K-DUR,KLOR-CON) CR tablet 40 mEq (not administered)  magnesium sulfate IVPB 1 g 100 mL (not administered)     Initial Impression / Assessment and Plan / ED Course  I have reviewed the triage vital signs and the nursing notes.  Pertinent labs & imaging results that were available during my care of the patient were reviewed by me and considered in my medical decision making (see chart for details).  Clinical Course as of Jan 07 1543  Mon Jan 06, 2017  1541 Patient is no longer somnolent.  I walked into the room.  Patient was standing up completely nude she had removed her gown, IV and urinary collection device.  Patient was wondering where her family was and states that she had just seen them a few minutes ago however they have not been at the hospital.    [JK]    Clinical Course User Index [JK] Dorie Rank, MD    Patient presented to the emergency room for evaluation of altered mental status.  Patient does have a history of dementia.  Initially she was very somnolent but I believe this was related to benzodiazepine she was given by EMS.  Patient now is awake and alert but is confused.  It is unclear if this is related to her dementia.  I do not see any signs of infection.  Patient's  laboratory tests are notable for profound hypokalemia.  I will start her on IV potassium and magnesium.  I will consult the medical versus for admission/further treatment.  Final Clinical Impressions(s) / ED Diagnoses   Final diagnoses:  Hypokalemia  Delirium      Dorie Rank, MD 01/06/17 1544

## 2017-01-06 NOTE — H&P (Signed)
History and Physical    Brianna [name] FYB:[id number] DOB: 05/02/1932 DOA: 01/06/2017  PCP: [name], MD  Patient coming from: home  Chief Complaint: Mental status change  HPI: Brianna [name] is a 83 y.o. female with medical history significant of Alzheimer's dementia, hyperlipidemia, hypertension, hypothyroid.  Patient is currently confused and unable to provide own history.  Patient presented with increased confusion and mental status change from home.  Historically, patient has had prior admission for metabolic encephalopathy related to profound hypokalemia.  In route to the emergency department, patient was noted to be combative, requiring Versed.  ED Course: In the emergency department, patient was found to have presenting potassium of 2.2.  Patient was given potassium replacement as well as magnesium.  Hospital service was consulted for consideration for admission.  Review of Systems:  Review of Systems  Unable to perform ROS: Dementia    Past Medical History:  Diagnosis Date  . Adenomatous colon polyp   . Dementia   . Depression with anxiety   . DUB (dysfunctional uterine bleeding)   . Elevated cholesterol   . Hyperlipidemia   . Hypertension   . Kidney stone   . Sleep apnea   . Thyroid disease    Hypothyroid    Past Surgical History:  Procedure Laterality Date  . ABDOMINAL HYSTERECTOMY    . APPENDECTOMY    . CHOLECYSTECTOMY    . Colon tumor-benign    . kidney stones       reports that  has never smoked. she has never used smokeless tobacco. She reports that she does not drink alcohol or use drugs.  Allergies  Allergen Reactions  . Penicillins Other (See Comments)    "didn't sit well" Has patient had a PCN reaction causing immediate rash, facial/tongue/throat swelling, SOB or lightheadedness with hypotension: No Has patient had a PCN reaction causing severe rash involving mucus membranes or skin necrosis: No Has patient had a PCN reaction that required  hospitalization No Has patient had a PCN reaction occurring within the last 10 years: No If all of the above answers are "NO", then may proceed with Cephalosporin use.    Family History  Problem Relation Age of Onset  . Diabetes Mother   . Heart disease Father   . Hypertension Brother   . Heart disease Brother   . Diabetes type II Brother   . Diabetes type II Sister   . Thyroid disease Child   . Diabetes Child     Prior to Admission medications   Medication Sig Start Date End Date Taking? Authorizing Provider  esomeprazole (NEXIUM) 40 MG capsule Take 40 mg by mouth every morning.     [provider]  iron polysaccharides (NU-IRON) 150 MG capsule Take 150 mg by mouth See admin instructions. Take 150mg  by mouth in the morning Take 300mg  by mouth at lunch 06/02/12   [provider]  levothyroxine (SYNTHROID, LEVOTHROID) 100 MCG tablet Take 100 mcg by mouth daily before breakfast.  07/30/15   [provider]  nisoldipine (SULAR) 17 MG 24 hr tablet Take 17 mg by mouth every morning.  03/13/09   [provider]  potassium chloride SA (K-DUR,KLOR-CON) 20 MEQ tablet Take 1 tablet (20 mEq total) by mouth daily. 06/25/16   Charlynne Cousins, MD  PRISTIQ 50 MG 24 hr tablet Take 50 mg by mouth every morning.  04/05/13   [provider]  traZODone (DESYREL) 50 MG tablet Take 25 mg by mouth at bedtime.  02/16/13   [provider]  valsartan (DIOVAN) 320 MG tablet Take 320 mg by mouth every morning.     [provider]  Vitamin D, Ergocalciferol, (DRISDOL) 50000 UNITS CAPS capsule Take 50,000 Units by mouth every 7 (seven) days. Tuesday    [provider]    Physical Exam: Vitals:   01/06/17 1326  BP: 136/79  Pulse: 65  Resp: 18  Temp: 98.3 F (36.8 C)  SpO2: 95%    Constitutional: NAD, calm, comfortable Vitals:   01/06/17 1326  BP: 136/79  Pulse: 65  Resp: 18  Temp: 98.3 F (36.8 C)  SpO2: 95%   Eyes: PERRL, lids  and conjunctivae normal ENMT: Mucous membranes are moist. Posterior pharynx clear of any exudate or lesions.Normal dentition.  Neck: normal, supple, no masses, no thyromegaly Respiratory: clear to auscultation bilaterally, no wheezing, no crackles. Normal respiratory effort. No accessory muscle use.  Cardiovascular: Regular rate and rhythm, no murmurs / rubs / gallops. No extremity edema. 2+ pedal pulses. No carotid bruits.  Abdomen: no tenderness, no masses palpated. No hepatosplenomegaly. Bowel sounds positive.  Musculoskeletal: no clubbing / cyanosis. No joint deformity upper and lower extremities. Good ROM, no contractures. Normal muscle tone.  Skin: no rashes, lesions, ulcers. No induration Neurologic: Unable to fully assess secondary to encephalopathy with dementia Psychiatric: Unable to fully assess secondary to confusion and encephalopathy   Labs on Admission: I have personally reviewed following labs and imaging studies  CBC: Recent Labs  Lab 01/06/17 1414  WBC 7.7  HGB 13.1  HCT 38.4  MCV 84.6  PLT 175   Basic Metabolic Panel: Recent Labs  Lab 01/06/17 1414  NA 136  K 2.2*  CL 101  CO2 27  GLUCOSE 87  BUN 12  CREATININE 0.88  CALCIUM 9.6   GFR: CrCl cannot be calculated (Unknown ideal weight.). Liver Function Tests: Recent Labs  Lab 01/06/17 1414  AST 18  ALT 8*  ALKPHOS 58  BILITOT 0.4  PROT 7.9  ALBUMIN 4.1   No results for input(s): LIPASE, AMYLASE in the last 168 hours. Recent Labs  Lab 01/06/17 1414  AMMONIA 19   Coagulation Profile: No results for input(s): INR, PROTIME in the last 168 hours. Cardiac Enzymes: No results for input(s): CKTOTAL, CKMB, CKMBINDEX, TROPONINI in the last 168 hours. BNP (last 3 results) No results for input(s): PROBNP in the last 8760 hours. HbA1C: No results for input(s): HGBA1C in the last 72 hours. CBG: No results for input(s): GLUCAP in the last 168 hours. Lipid Profile: No results for input(s): CHOL,  HDL, LDLCALC, TRIG, CHOLHDL, LDLDIRECT in the last 72 hours. Thyroid Function Tests: No results for input(s): TSH, T4TOTAL, FREET4, T3FREE, THYROIDAB in the last 72 hours. Anemia Panel: No results for input(s): VITAMINB12, FOLATE, FERRITIN, TIBC, IRON, RETICCTPCT in the last 72 hours. Urine analysis:    Component Value Date/Time   COLORURINE STRAW (A) 01/06/2017 1430   APPEARANCEUR CLEAR 01/06/2017 1430   LABSPEC 1.004 (L) 01/06/2017 1430   PHURINE 6.0 01/06/2017 1430   GLUCOSEU NEGATIVE 01/06/2017 1430   HGBUR SMALL (A) 01/06/2017 1430   BILIRUBINUR NEGATIVE 01/06/2017 1430   KETONESUR NEGATIVE 01/06/2017 1430   PROTEINUR NEGATIVE 01/06/2017 1430   UROBILINOGEN 0.2 02/04/2014 1509   NITRITE NEGATIVE 01/06/2017 1430   LEUKOCYTESUR NEGATIVE 01/06/2017 1430   Sepsis Labs: !!!!!!!!!!!!!!!!!!!!!!!!!!!!!!!!!!!!!!!!!!!! @LABRCNTIP (procalcitonin:4,lacticidven:4) )No results found for this or any previous visit (from the past 240 hour(s)).   Radiological Exams on Admission: Dg Chest 1 View  Result Date: 01/06/2017 CLINICAL DATA:  Confusion, uncooperative, altered mental status, history hypertension, dementia EXAM: CHEST 1 VIEW COMPARISON:  Portable exam 1356 hours compared to 06/24/2016 FINDINGS: Upper normal size of cardiac silhouette. Mediastinal contours and pulmonary vascularity normal. Lungs clear. No pleural effusion or pneumothorax. Bones unremarkable. IMPRESSION: No acute abnormalities. Electronically Signed   By: Lavonia Dana M.D.   On: 01/06/2017 14:10   Ct Head Wo Contrast  Result Date: 01/06/2017 CLINICAL DATA:  Altered level of consciousness, combative, artery meta diff, history dementia, hypertension EXAM: CT HEAD WITHOUT CONTRAST TECHNIQUE: Contiguous axial images were obtained from the base of the skull through the vertex without intravenous contrast. Sagittal and coronal MPR images reconstructed from axial data set. COMPARISON:  04/22/2016 FINDINGS: Brain: Mild motion  artifacts. Generalized atrophy. Normal ventricular morphology. No midline shift or mass effect. Small vessel chronic ischemic changes of deep cerebral white matter. No intracranial hemorrhage, mass lesion or evidence of acute infarction. No extra-axial fluid collections. Vascular: Unremarkable Skull: Normal appearance Sinuses/Orbits: Small amount chronic fluid within the LEFT sphenoid sinus. Remaining visualized paranasal sinuses and mastoid air cells clear Other: N/A IMPRESSION: Atrophy with small vessel chronic ischemic changes of deep cerebral white matter. No acute intracranial abnormalities. Electronically Signed   By: Lavonia Dana M.D.   On: 01/06/2017 14:07    EKG: Independently reviewed. NSR, QTc 437  Assessment/Plan Principal Problem:   Acute metabolic encephalopathy Active Problems:   Hypertension   Dementia   Alzheimer's dementia with behavioral disturbance   Hypokalemia   1. Acute metabolic encephalopathy 1. Reviewed.  Patient historically has encephalopathy made worse with electrolyte abnormalities.  Patient was last discharged in May 1610 for metabolic encephalopathy related to profound hypokalemia. 2. Currently, chest x-ray unremarkable per my read, urinalysis unremarkable.  No identifiable source of infection identified. 3. CT reviewed,No acute findings noted 4. Potassium replacement given in the emergency department.  Continue to correct electrolytes as tolerated 5. Given agitation, will order as needed Haldol nightly 2. HTN 1. Blood pressure presently stable 2. Continue home regimen as tolerated 3. Dementia 1. Per above, patient with increased confusion and encephalopathy 2. Continue home regimen as tolerated.  Will provide antipsychotic as needed at bedtime 4. Hypokalemia 1. Replacement ordered through the emergency department 2. Repeat basic metabolic panel in the morning, replace as needed  DVT prophylaxis: Lovenox  Code Status: Full Family Communication: Pt in  room  Disposition Plan: Uncertain at this time  Consults called:  Admission status: Observation status as it will likely require less than 2 midnight stay to correct electrolytes  Marylu Lund MD [facility] Pager [phone]  If 7PM-7AM, please contact night-coverage [email or web address] Password [email or web address]  01/06/2017, 4:13 PM

## 2017-01-06 NOTE — ED Notes (Signed)
Pt pulled out IV and EKG monitoring and was found walking around room with out hospital gown. Pt is confused pt redirected and cleansed.

## 2017-01-06 NOTE — ED Triage Notes (Signed)
Pt has HX of dementia

## 2017-01-06 NOTE — ED Notes (Signed)
Assigned 1513 @ 18:43 call report @ 19:03

## 2017-01-06 NOTE — ED Notes (Addendum)
Pt K+ critical at 2.2. Dr. Tomi Bamberger has been made aware.

## 2017-01-06 NOTE — ED Notes (Signed)
Bed: WA01 Expected date:  Expected time:  Means of arrival:  Comments: EMS-dementia/electrolyte imbalance

## 2017-01-06 NOTE — ED Triage Notes (Signed)
Pt arrived via EMS from Home. Pt family called as pt has been combative, and argumentative. CG states that this is not normal behavior, Pt had similar behavior in the past, and K+ levels were off per EMS/CG. Pt denies any pain or discomfort.    EMS v/s 130/80, HR 76, RR 18, O2 94%  RA.   Pt was given 5mg  of Midazolam IM, for combative behavior enroute

## 2017-01-07 DIAGNOSIS — F0390 Unspecified dementia without behavioral disturbance: Secondary | ICD-10-CM | POA: Diagnosis not present

## 2017-01-07 DIAGNOSIS — Z79899 Other long term (current) drug therapy: Secondary | ICD-10-CM | POA: Diagnosis not present

## 2017-01-07 DIAGNOSIS — D509 Iron deficiency anemia, unspecified: Secondary | ICD-10-CM | POA: Diagnosis not present

## 2017-01-07 DIAGNOSIS — E876 Hypokalemia: Secondary | ICD-10-CM | POA: Diagnosis present

## 2017-01-07 DIAGNOSIS — R488 Other symbolic dysfunctions: Secondary | ICD-10-CM | POA: Diagnosis not present

## 2017-01-07 DIAGNOSIS — F0281 Dementia in other diseases classified elsewhere with behavioral disturbance: Secondary | ICD-10-CM

## 2017-01-07 DIAGNOSIS — G9341 Metabolic encephalopathy: Secondary | ICD-10-CM | POA: Diagnosis not present

## 2017-01-07 DIAGNOSIS — Z79890 Hormone replacement therapy (postmenopausal): Secondary | ICD-10-CM | POA: Diagnosis not present

## 2017-01-07 DIAGNOSIS — D638 Anemia in other chronic diseases classified elsewhere: Secondary | ICD-10-CM | POA: Diagnosis not present

## 2017-01-07 DIAGNOSIS — Z880 Allergy status to penicillin: Secondary | ICD-10-CM | POA: Diagnosis not present

## 2017-01-07 DIAGNOSIS — R293 Abnormal posture: Secondary | ICD-10-CM | POA: Diagnosis not present

## 2017-01-07 DIAGNOSIS — M6281 Muscle weakness (generalized): Secondary | ICD-10-CM | POA: Diagnosis not present

## 2017-01-07 DIAGNOSIS — G309 Alzheimer's disease, unspecified: Secondary | ICD-10-CM | POA: Diagnosis present

## 2017-01-07 DIAGNOSIS — G301 Alzheimer's disease with late onset: Secondary | ICD-10-CM

## 2017-01-07 DIAGNOSIS — R262 Difficulty in walking, not elsewhere classified: Secondary | ICD-10-CM | POA: Diagnosis not present

## 2017-01-07 DIAGNOSIS — I10 Essential (primary) hypertension: Secondary | ICD-10-CM | POA: Diagnosis present

## 2017-01-07 DIAGNOSIS — R4182 Altered mental status, unspecified: Secondary | ICD-10-CM | POA: Diagnosis not present

## 2017-01-07 DIAGNOSIS — K219 Gastro-esophageal reflux disease without esophagitis: Secondary | ICD-10-CM | POA: Diagnosis not present

## 2017-01-07 DIAGNOSIS — E039 Hypothyroidism, unspecified: Secondary | ICD-10-CM | POA: Diagnosis present

## 2017-01-07 LAB — COMPREHENSIVE METABOLIC PANEL
ALK PHOS: 47 U/L (ref 38–126)
ALT: 7 U/L — AB (ref 14–54)
AST: 18 U/L (ref 15–41)
Albumin: 3.6 g/dL (ref 3.5–5.0)
Anion gap: 9 (ref 5–15)
BUN: 13 mg/dL (ref 6–20)
CALCIUM: 9.3 mg/dL (ref 8.9–10.3)
CHLORIDE: 106 mmol/L (ref 101–111)
CO2: 23 mmol/L (ref 22–32)
CREATININE: 0.87 mg/dL (ref 0.44–1.00)
GFR calc non Af Amer: >60 mL/min (ref >60)
Glucose, Bld: 82 mg/dL (ref 65–99)
Potassium: 4.1 mmol/L (ref 3.5–5.1)
SODIUM: 138 mmol/L (ref 135–145)
Total Bilirubin: 0.6 mg/dL (ref 0.3–1.2)
Total Protein: 6.8 g/dL (ref 6.5–8.1)

## 2017-01-07 LAB — CBC
HCT: 37.1 % (ref 36.0–46.0)
Hemoglobin: 11.9 g/dL — ABNORMAL LOW (ref 12.0–15.0)
MCH: 27.7 pg (ref 26.0–34.0)
MCHC: 32.1 g/dL (ref 30.0–36.0)
MCV: 86.5 fL (ref 78.0–100.0)
PLATELETS: 226 10*3/uL (ref 150–400)
RBC: 4.29 MIL/uL (ref 3.87–5.11)
RDW: 13.6 % (ref 11.5–15.5)
WBC: 6.5 10*3/uL (ref 4.0–10.5)

## 2017-01-07 NOTE — Care Management Obs Status (Signed)
[place] NOTIFICATION   Patient Details  Name: Brianna [name] MRN: [id number] Date of Birth: 23-Feb-1932   Medicare Observation Status Notification Given:  Yes    Leeroy Cha, RN 01/07/2017, 9:33 AM

## 2017-01-07 NOTE — Care Management Note (Signed)
Case Management Note  Patient Details  Name: Brianna [name] MRN: [id number] Date of Birth: [birth year]  Subjective/Objective:                  ams  Action/Plan:  Date:  January 07, 2017 Chart reviewed for concurrent status and case management needs.  Will continue to follow patient progress.  Discharge Planning: following for needs  Expected discharge date: January 10, 2017  Velva Harman, BSN, [place], [place]   Expected Discharge Date:  (unknown)               Expected Discharge Plan:  Home/Self Care  In-House Referral:     Discharge planning Services  CM Consult  Post Acute Care Choice:    Choice offered to:     DME Arranged:    DME Agency:     HH Arranged:    HH Agency:     Status of Service:  In process, will continue to follow  If discussed at Long Length of Stay Meetings, dates discussed:    Additional Comments:  Leeroy Cha, RN 01/07/2017, 9:37 AM

## 2017-01-07 NOTE — Progress Notes (Addendum)
Patient ID: Brianna [name], female   DOB: [birth year], 83 y.o.   MRN: [id number]  PROGRESS NOTE    Brianna [name]  FGH:[id number] DOB: [birth year] DOA: 01/06/2017  PCP: [name], MD   Brief Narrative:  83 year old female with hypertension, dementia who presented with worsening mental status changes, hypokalemia. CT showed no acute intracranial findings.   Assessment & Plan:   Principal Problem:   Acute metabolic encephalopathy / Alzheimer's dementia with behavioral disturbance - No acute changes on CT head - Stable - PT eval pending   Active Problems:   Hypokalemia - Supplemented and WNL    DVT prophylaxis: Lovenox subQ Code Status: full code  Family Communication: no family at the bedside  Disposition Plan: d/c in am after PT eval   Consultants:   PT  Procedures:   None   Antimicrobials:   None    Subjective: No overnight events.  Objective: Vitals:   01/06/17 1808 01/06/17 1810 01/06/17 2018 01/07/17 0500  BP: (!) 172/75 (!) 172/75 (!) 144/49 (!) 165/81  Pulse: 64 73 (!) 54 62  Resp: 18  20 20   Temp: 98.2 F (36.8 C)  98.1 F (36.7 C) 98 F (36.7 C)  TempSrc: Oral  Oral Axillary  SpO2: 99% 99% 99% 99%  Weight:   51.7 kg (113 lb 15.7 oz)     Intake/Output Summary (Last 24 hours) at 01/07/2017 1227 Last data filed at 01/07/2017 0700 Gross per 24 hour  Intake 1050 ml  Output -  Net 1050 ml   Filed Weights   01/06/17 2018  Weight: 51.7 kg (113 lb 15.7 oz)    Examination:  General exam: Appears calm and comfortable  Respiratory system: Clear to auscultation. Respiratory effort normal. Cardiovascular system: S1 & S2 heard, RRR.  Gastrointestinal system: Abdomen is nondistended, soft and nontender. No organomegaly or masses felt. Normal bowel sounds heard. Central nervous system: No focal neurological deficits. Extremities: Symmetric 5 x 5 power. Skin: No rashes, lesions or ulcers Psychiatry: No restlessness or agitation   Data  Reviewed: I have personally reviewed following labs and imaging studies  CBC: Recent Labs  Lab 01/06/17 1414 01/07/17 0626  WBC 7.7 6.5  HGB 13.1 11.9*  HCT 38.4 37.1  MCV 84.6 86.5  PLT 243 678   Basic Metabolic Panel: Recent Labs  Lab 01/06/17 1414 01/06/17 2122 01/07/17 0626  NA 136  --  138  K 2.2*  --  4.1  CL 101  --  106  CO2 27  --  23  GLUCOSE 87  --  82  BUN 12  --  13  CREATININE 0.88  --  0.87  CALCIUM 9.6  --  9.3  MG  --  1.9  --    GFR: Estimated Creatinine Clearance: 35.8 mL/min (by C-G formula based on SCr of 0.87 mg/dL). Liver Function Tests: Recent Labs  Lab 01/06/17 1414 01/07/17 0626  AST 18 18  ALT 8* 7*  ALKPHOS 58 47  BILITOT 0.4 0.6  PROT 7.9 6.8  ALBUMIN 4.1 3.6   No results for input(s): LIPASE, AMYLASE in the last 168 hours. Recent Labs  Lab 01/06/17 1414  AMMONIA 19   Coagulation Profile: No results for input(s): INR, PROTIME in the last 168 hours. Cardiac Enzymes: No results for input(s): CKTOTAL, CKMB, CKMBINDEX, TROPONINI in the last 168 hours. BNP (last 3 results) No results for input(s): PROBNP in the last 8760 hours. HbA1C: No results for input(s): HGBA1C in the last  72 hours. CBG: No results for input(s): GLUCAP in the last 168 hours. Lipid Profile: No results for input(s): CHOL, HDL, LDLCALC, TRIG, CHOLHDL, LDLDIRECT in the last 72 hours. Thyroid Function Tests: No results for input(s): TSH, T4TOTAL, FREET4, T3FREE, THYROIDAB in the last 72 hours. Anemia Panel: No results for input(s): VITAMINB12, FOLATE, FERRITIN, TIBC, IRON, RETICCTPCT in the last 72 hours. Urine analysis:    Component Value Date/Time   COLORURINE STRAW (A) 01/06/2017 1430   APPEARANCEUR CLEAR 01/06/2017 1430   LABSPEC 1.004 (L) 01/06/2017 1430   PHURINE 6.0 01/06/2017 1430   GLUCOSEU NEGATIVE 01/06/2017 1430   HGBUR SMALL (A) 01/06/2017 1430   BILIRUBINUR NEGATIVE 01/06/2017 1430   KETONESUR NEGATIVE 01/06/2017 1430   PROTEINUR  NEGATIVE 01/06/2017 1430   UROBILINOGEN 0.2 02/04/2014 1509   NITRITE NEGATIVE 01/06/2017 1430   LEUKOCYTESUR NEGATIVE 01/06/2017 1430   Sepsis Labs: @LABRCNTIP (procalcitonin:4,lacticidven:4)   )No results found for this or any previous visit (from the past 240 hour(s)).    Radiology Studies: Dg Chest 1 View  Result Date: 01/06/2017 CLINICAL DATA:  Confusion, uncooperative, altered mental status, history hypertension, dementia EXAM: CHEST 1 VIEW COMPARISON:  Portable exam 1356 hours compared to 06/24/2016 FINDINGS: Upper normal size of cardiac silhouette. Mediastinal contours and pulmonary vascularity normal. Lungs clear. No pleural effusion or pneumothorax. Bones unremarkable. IMPRESSION: No acute abnormalities. Electronically Signed   By: Lavonia Dana M.D.   On: 01/06/2017 14:10   Ct Head Wo Contrast  Result Date: 01/06/2017 CLINICAL DATA:  Altered level of consciousness, combative, artery meta diff, history dementia, hypertension EXAM: CT HEAD WITHOUT CONTRAST TECHNIQUE: Contiguous axial images were obtained from the base of the skull through the vertex without intravenous contrast. Sagittal and coronal MPR images reconstructed from axial data set. COMPARISON:  04/22/2016 FINDINGS: Brain: Mild motion artifacts. Generalized atrophy. Normal ventricular morphology. No midline shift or mass effect. Small vessel chronic ischemic changes of deep cerebral white matter. No intracranial hemorrhage, mass lesion or evidence of acute infarction. No extra-axial fluid collections. Vascular: Unremarkable Skull: Normal appearance Sinuses/Orbits: Small amount chronic fluid within the LEFT sphenoid sinus. Remaining visualized paranasal sinuses and mastoid air cells clear Other: N/A IMPRESSION: Atrophy with small vessel chronic ischemic changes of deep cerebral white matter. No acute intracranial abnormalities. Electronically Signed   By: Lavonia Dana M.D.   On: 01/06/2017 14:07     Scheduled Meds: .  enoxaparin (LOVENOX) injection  40 mg Subcutaneous Q24H  . levothyroxine  100 mcg Oral QAC breakfast  . losartan  100 mg Oral Daily  . pantoprazole  40 mg Oral Daily  . venlafaxine XR  37.5 mg Oral Q breakfast   Continuous Infusions: . sodium chloride 100 mL/hr at 01/06/17 2311     LOS: 1 day    Time spent: 25 minutes  Greater than 50% of the time spent on counseling and coordinating the care.   [name], MD [facility] Pager [phone]  If 7PM-7AM, please contact night-coverage [email or web address] Password [facility] 01/07/2017, 12:27 PM

## 2017-01-07 NOTE — Plan of Care (Signed)
Patient has sleep through night.  Have been giving electrolyte supplements through night.  She was only oriented to self upon arrival but was pleasant and cooperative .

## 2017-01-08 NOTE — Progress Notes (Signed)
PT Cancellation Note  Patient Details Name: Brianna [name] MRN: [id number] DOB: [birth year]   Cancelled Treatment:    Reason Eval/Treat Not Completed: Attempted PT eval-pt refuses to participate.    Weston Anna, MPT Pager: [phone]

## 2017-01-08 NOTE — Discharge Instructions (Signed)
Delirium Delirium is a state of mental confusion. It comes on quickly and causes significant changes in a person's thinking and behavior. People with delirium usually have trouble paying attention to what is going on or knowing where they are. They may become very withdrawn or very emotional and unable to sit still. They may even see or feel things that are not there (hallucinations). Delirium is a sign of a serious underlying medical condition. What are the causes? Delirium occurs when something suddenly affects the signals that the brain sends out. Brain signals can be affected by anything that puts severe stress on the body and brain and causes brain chemicals to be out of balance. The most common causes of delirium include:  Infections. These may be bacterial, viral, fungal, or protozoal.  Medicines. These include many over-the-counter and prescription medicines.  Recreational drugs.  Substance withdrawal. This occurs with sudden discontinuation of alcohol, certain medicines, or recreational drugs.  Surgery.  Sudden vascular events, such as stroke, brain hemorrhage, and severe migraine.  Other brain disorders, such as tumors, seizures, and physical head trauma.  Metabolic disorders, such as kidney or liver failure.  Low blood oxygen (anoxia). This may occur with lung disease, cardiac arrest, or carbon monoxide poisoning.  Hormone imbalances (endocrinopathies), such as an overactive thyroid (hyperthyroidism) or underactive thyroid (hypothyroidism).  Vitamin deficiencies.  What increases the risk? This condition is more likely to develop in:  Children.  Older people.  People who live alone.  People who have vision loss or hearing loss.  People who have existing brain disease, such as dementia.  People who have long-lasting (chronic) medical conditions, such as heart disease.  People who are hospitalized for long periods of time.  What are the signs or symptoms? Delirium  starts with a sudden change in a person's thinking or behavior. Symptoms come and go (fluctuate) over time, and they are often worse at the end of the day. Symptoms include:  Not being able to stay awake (drowsiness) or pay attention.  Being confused about places, time, and people.  Forgetfulness.  Having extreme energy levels. These may be low or high.  Changes in sleep patterns.  Extreme mood swings, such as anger or anxiety.  Focusing on things or ideas that are not important.  Rambling and senseless talking.  Difficulty speaking, understanding speech, or both.  Hallucinations.  Tremor or unsteady gait.  How is this diagnosed? People with delirium may not realize that they have the condition. Often, a family member or health care provider is the first person to notice the changes. The health care provider will obtain a detailed history of current symptoms, medical issues, medicines, and recreational drug use. The health care provider will perform a mental status examination by:  Asking questions to check for confusion.  Watching for abnormal behavior.  The health care provider may perform a physical exam and order lab tests or additional studies to determine the cause of the delirium. How is this treated? Treatment of delirium depends on the cause and severity. Delirium usually goes away within days or weeks of treating the underlying cause. In the meantime, the person should not be left alone because he or she may accidentally cause self-harm. Treatment includes supportive care, such as:  Increased light during the day and decreased light at night.  Low noise level.  Uninterrupted sleep.  A regular daily schedule.  Clocks and calendars to help with orientation.  Familiar objects, including the person's pictures and clothing.  Frequent visits   from familiar family and friends.  Healthy diet.  Exercise.  In more severe cases of delirium, medicine may be  prescribed to help the person to keep calm and think more clearly. Follow these instructions at home:  Any supportive care should be continued as told by the health care provider.  All medicines should be used as told by the health care provider. This is important.  The health care provider should be consulted before over-the-counter medicines, herbs, or supplements are used.  All follow-up visits should be kept as told by the health care provider. This is important.  Alcohol and recreational drugs should be avoided as told by the health care provider. Contact a health care provider if:  Symptoms do not get better or they become worse.  New symptoms of delirium develop.  Caring for the person at home does not seem safe.  Eating, drinking, or communicating stops.  There are side effects of medicines, such as changes in sleep patterns, dizziness, weight gain, restlessness, movement changes, or tremors. Get help right away if:  Serious thoughts occur about self-harm or about hurting others.  There are serious side effects of medicine, such as: ? Swelling of the face, lips, tongue, or throat. ? Fever, confusion, muscle spasms, or seizures. This information is not intended to replace advice given to you by your health care provider. Make sure you discuss any questions you have with your health care provider. Document Released: 10/09/2011 Document Revised: 06/22/2015 Document Reviewed: 03/09/2014 Elsevier Interactive Patient Education  2018 [employer]  

## 2017-01-08 NOTE — NC FL2 (Signed)
[place] MEDICAID FL2 LEVEL OF CARE SCREENING TOOL     IDENTIFICATION  Patient Name: Brianna [name] Birthdate: 01-May-1932 Sex: female Admission Date (Current Location): 01/06/2017  [facility] and [place] Number:  Herbalist and Address:  [facility],  [address], [place]      Provider Number: [id number]  Attending Physician Name and Address:  Robbie Lis, MD  Relative Name and Phone Number:       Current Level of Care:   Recommended Level of Care: [place] Prior Approval Number:    Date Approved/Denied: 03/09/15 PASRR Number: [id number]  Discharge Plan: SNF    Current Diagnoses: Patient Active Problem List   Diagnosis Date Noted  . Acute encephalopathy 01/06/2017  . Pressure injury of skin 06/25/2016  . Hypokalemia 06/24/2016  . Depression 05/06/2016  . Vitamin D deficiency 05/06/2016  . Insomnia 05/06/2016  . Acute metabolic encephalopathy 41/28/7867  . Delirium 04/22/2016  . Leukocytosis 03/19/2015  . AKI (acute kidney injury) ([place]) 03/14/2015  . GERD (gastroesophageal reflux disease) 03/14/2015  . Diaper dermatitis 03/14/2015  . Syncope 03/08/2015  . Physical deconditioning 03/08/2015  . Alzheimer's dementia with behavioral disturbance 10/11/2014  . Iron deficiency anemia 10/26/2013  . Diarrhea 10/26/2013  . Near syncope 09/06/2013  . Hypothyroid 09/06/2013  . Hyponatremia 02/13/2013  . Weakness 02/13/2013  . 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 Hypersomnia with sleep apnea, unspecified 11/17/2012  . DUB (dysfunctional uterine bleeding)   . Hypertension   . Dementia   . Elevated cholesterol   . Kidney stone     Orientation RESPIRATION BLADDER Height & Weight     Self  Normal Incontinent Weight: 113 lb 15.7 oz (51.7 kg) Height:     BEHAVIORAL SYMPTOMS/MOOD NEUROLOGICAL BOWEL NUTRITION STATUS      Continent Diet(See dc summary)  AMBULATORY  STATUS COMMUNICATION OF NEEDS Skin   Extensive Assist Verbally Normal                       Personal Care Assistance Level of Assistance  Bathing, Feeding, Dressing Bathing Assistance: Limited assistance Feeding assistance: Independent Dressing Assistance: Limited assistance     Functional Limitations Info  Sight, Hearing, Speech Sight Info: Adequate Hearing Info: Adequate Speech Info: Adequate    SPECIAL CARE FACTORS FREQUENCY  PT (By licensed PT), OT (By licensed OT)     PT Frequency: 5x/week OT Frequency: 5x/week            Contractures Contractures Info: Not present    Additional Factors Info  Allergies, Code Status Code Status Info: Full Allergies Info: Penicillins           Current Medications (01/08/2017):  This is the current hospital active medication list Current Facility-Administered Medications  Medication Dose Route Frequency Provider Last Rate Last Dose  . 0.9 %  sodium chloride infusion   Intravenous Continuous Donne Hazel, MD 100 mL/hr at 01/08/17 0214    . acetaminophen (TYLENOL) tablet 650 mg  650 mg Oral Q6H PRN Donne Hazel, MD   650 mg at 01/07/17 2232   Or  . acetaminophen (TYLENOL) suppository 650 mg  650 mg Rectal Q6H PRN Donne Hazel, MD      . enoxaparin (LOVENOX) injection 40 mg  40 mg Subcutaneous Q24H Donne Hazel, MD   40 mg at 01/06/17 2259  . haloperidol lactate (HALDOL) injection 2 mg  2 mg Intravenous  QHS PRN Donne Hazel, MD   2 mg at 01/08/17 0012  . levothyroxine (SYNTHROID, LEVOTHROID) tablet 100 mcg  100 mcg Oral QAC breakfast Donne Hazel, MD      . losartan (COZAAR) tablet 100 mg  100 mg Oral Daily Donne Hazel, MD      . pantoprazole (PROTONIX) EC tablet 40 mg  40 mg Oral Daily Donne Hazel, MD      . venlafaxine XR (EFFEXOR-XR) 24 hr capsule 37.5 mg  37.5 mg Oral Q breakfast Donne Hazel, MD         Discharge Medications: Please see discharge summary for a list of discharge  medications.  Relevant Imaging Results:  Relevant Lab Results:   Additional Information SS# [id number]  Servando Snare, LCSW

## 2017-01-08 NOTE — Progress Notes (Signed)
Physical Therapy Treatment Patient Details Name: Brianna [name] MRN: [id number] DOB: 1932/12/05 Today's Date: 01/08/2017    History of Present Illness 83 yo female admitted with hypokalemia, metabolic encephalopathy, aggressive behavior. Hx of dementia, HTN, syncope, sleep apnea, depression    PT Comments    Pt's bed alarm going off. RN and therapist in room to assist. Pt requesting to get to [facility]. Assisted pt onto bsc. Daughter arrived after this. Pt was able to perform toilet hygiene with cueing from daughter. Assisted pt back into bed. Pt was much more cooperative and participatory this time-daughter's presence helped as well. Discussed d/c plan with daughter-she would like pt to be able to go to rehab short term to regain her independence. Will continue to follow and progress activity as able. Continue to recommend SNF.     Follow Up Recommendations  SNF     Equipment Recommendations  None recommended by PT    Recommendations for Other Services       Precautions / Restrictions Precautions Precautions: Fall Precaution Comments: incontinent Restrictions Weight Bearing Restrictions: No    Mobility  Bed Mobility Overal bed mobility: Needs Assistance Bed Mobility: Supine to Sit;Sit to Supine     Supine to sit: Min guard Sit to supine: Min guard   General bed mobility comments: close guard for safety  Transfers Overall transfer level: Needs assistance Equipment used: 1 person hand held assist Transfers: Sit to/from [facility] Sit to Stand: Min assist Stand pivot transfers: Min assist       General transfer comment: Assist to rise, stabilize, control descent. Stand pivot x 2, bed<>bsc, with HHA to steady  Ambulation/Gait Ambulation/Gait assistance: Min assist Ambulation Distance (Feet): 3 Feet Assistive device: 1 person hand held assist Gait Pattern/deviations: Step-through pattern     General Gait Details: Pt took a few steps from bsc   towards [facility] before pivoting.    Stairs            Wheelchair Mobility    Modified Rankin (Stroke Patients Only)       Balance Overall balance assessment: Needs assistance         Standing balance support: Single extremity supported Standing balance-Leahy Scale: Poor                              Cognition Arousal/Alertness: Awake/alert Behavior During Therapy: WFL for tasks assessed/performed Overall Cognitive Status: History of cognitive impairments - at baseline                                 General Comments: pt initially refusing participation. able to coax pt into standing enough to get linens on bed changed.       Exercises      General Comments        Pertinent Vitals/Pain Pain Assessment: No/denies pain    Home Living Family/patient expects to be discharged to:: Private residence Living Arrangements: Other relatives Available Help at Discharge: Family Type of Home: House         Additional Comments: no family present to provide info    Prior Function Level of Independence: Needs assistance      Comments: no family present to provide PLOF   PT Goals (current goals can now be found in the care plan section) Acute Rehab PT Goals Patient Stated Goal: none stated. no family present. PT Goal Formulation:  Patient unable to participate in goal setting Time For Goal Achievement: 01/22/17 Potential to Achieve Goals: Fair Progress towards PT goals: Progressing toward goals    Frequency    Min 3X/week      PT Plan Current plan remains appropriate    Co-evaluation              AM-PAC PT "6 Clicks" Daily Activity  Outcome Measure  Difficulty turning over in bed (including adjusting bedclothes, sheets and blankets)?: Unable Difficulty moving from lying on back to sitting on the side of the bed? : Unable Difficulty sitting down on and standing up from a chair with arms (e.g., wheelchair, bedside commode,  etc,.)?: Unable Help needed moving to and from a bed to chair (including a wheelchair)?: A Little Help needed walking in hospital room?: A Little Help needed climbing 3-5 steps with a railing? : A Lot 6 Click Score: 11    End of Session   Activity Tolerance: Patient tolerated treatment well Patient left: in bed;with call bell/phone within reach;with bed alarm set;with family/visitor present   PT Visit Diagnosis: Muscle weakness (generalized) (M62.81);Other abnormalities of gait and mobility (R26.89)     Time: 0240-9735 PT Time Calculation (min) (ACUTE ONLY): 10 min  Charges:  $Therapeutic Activity: 8-22 mins                    G Codes:          Weston Anna, MPT Pager: [phone]

## 2017-01-08 NOTE — Evaluation (Signed)
Physical Therapy Evaluation Patient Details Name: Brianna [name] MRN: [id number] DOB: 10/07/1932 Today's Date: 01/08/2017   History of Present Illness  83 yo female admitted with hypokalemia, metabolic encephalopathy, aggressive behavior. Hx of dementia, HTN, syncope, sleep apnea, depression  Clinical Impression  On eval, pt required Min assist for mobility. She was able to stand and take a few steps in the room with HHA. Max encouragement required for participation. She was a bit agitated by not combative. Assisted pt back to bed. No family present during session. Will recommend SNF for ST rehab at this time.     Follow Up Recommendations SNF    Equipment Recommendations  None recommended by PT    Recommendations for Other Services       Precautions / Restrictions Precautions Precautions: Fall Precaution Comments: incontinent Restrictions Weight Bearing Restrictions: No      Mobility  Bed Mobility Overal bed mobility: Needs Assistance Bed Mobility: Supine to Sit;Sit to Supine     Supine to sit: Min assist Sit to supine: Min guard   General bed mobility comments: Small amount of assist to guide pt into sitting at EOB. Increased time and cueing required.   Transfers Overall transfer level: Needs assistance Equipment used: 1 person hand held assist Transfers: Sit to/from Stand Sit to Stand: Min assist         General transfer comment: Assist to rise, stabilize, control descent. Max cueing and encouragment.   Ambulation/Gait Ambulation/Gait assistance: Min assist Ambulation Distance (Feet): 3 Feet Assistive device: 1 person hand held assist       General Gait Details: Pt took a few steps away from bed then back to bed with HHA. Assist to steady.   Stairs            Wheelchair Mobility    Modified Rankin (Stroke Patients Only)       Balance Overall balance assessment: Needs assistance         Standing balance support: Single extremity  supported Standing balance-Leahy Scale: Poor                               Pertinent Vitals/Pain Pain Assessment: No/denies pain    Home Living Family/patient expects to be discharged to:: Private residence Living Arrangements: Other relatives Available Help at Discharge: Family Type of Home: House           Additional Comments: no family present to provide info    Prior Function Level of Independence: Needs assistance         Comments: no family present to provide PLOF     Hand Dominance        Extremity/Trunk Assessment   Upper Extremity Assessment Upper Extremity Assessment: Generalized weakness    Lower Extremity Assessment Lower Extremity Assessment: Generalized weakness       Communication   Communication: No difficulties  Cognition Arousal/Alertness: Awake/alert   Overall Cognitive Status: History of cognitive impairments - at baseline                                 General Comments: pt initially refusing participation. able to coax pt into standing enough to get linens on bed changed.       General Comments      Exercises     Assessment/Plan    PT Assessment Patient needs continued PT services  PT Problem List  PT Treatment Interventions Gait training;Functional mobility training;Therapeutic activities;Therapeutic exercise;Balance training;Patient/family education    PT Goals (Current goals can be found in the Care Plan section)  Acute Rehab PT Goals Patient Stated Goal: none stated. no family present. PT Goal Formulation: Patient unable to participate in goal setting Time For Goal Achievement: 01/22/17 Potential to Achieve Goals: Fair    Frequency Min 3X/week   Barriers to discharge        Co-evaluation               AM-PAC PT "6 Clicks" Daily Activity  Outcome Measure Difficulty turning over in bed (including adjusting bedclothes, sheets and blankets)?: Unable Difficulty moving  from lying on back to sitting on the side of the bed? : Unable Difficulty sitting down on and standing up from a chair with arms (e.g., wheelchair, bedside commode, etc,.)?: Unable Help needed moving to and from a bed to chair (including a wheelchair)?: A Little Help needed walking in hospital room?: A Little Help needed climbing 3-5 steps with a railing? : A Lot 6 Click Score: 11    End of Session   Activity Tolerance: Patient tolerated treatment well Patient left: in bed;with call bell/phone within reach;with bed alarm set   PT Visit Diagnosis: Muscle weakness (generalized) (M62.81);Other abnormalities of gait and mobility (R26.89)    Time: 1004-1016 PT Time Calculation (min) (ACUTE ONLY): 12 min   Charges:   PT Evaluation $PT Eval Moderate Complexity: 1 Mod     PT G Codes:          Weston Anna, MPT Pager: [phone]

## 2017-01-08 NOTE — Discharge Summary (Addendum)
Physician Discharge Summary  Brianna [name] OEU:[id number] DOB: March 03, 1932 DOA: 01/06/2017  PCP: [name], MD  Admit date: 01/06/2017 Discharge date: 01/09/2017  Recommendations for Outpatient Follow-up:  1. Continue home meds  Discharge Diagnoses:  Principal Problem:   Acute metabolic encephalopathy Active Problems:   Hypertension   Dementia   Alzheimer's dementia with behavioral disturbance   Hypokalemia   Acute encephalopathy    Discharge Condition: stable   Diet recommendation: as tolerated   History of present illness:  83 year old female with hypertension, dementia who presented with worsening mental status changes, hypokalemia. CT showed no acute intracranial findings.  Hospital Course:    Assessment & Plan:   Principal Problem:   Acute metabolic encephalopathy / Alzheimer's dementia with behavioral disturbance - No acute findings on CT head  - Awaiting PT evaluation - Appreciate SW assistance with discharge planning   Active Problems:   Hypokalemia - Supplemented and WNL    Hypothyroidism - Continue synthroid     Essential hypertension  Continue Losartan    DVT prophylaxis: Lovenox subQ Code Status: full code  Family Communication: no family at the bedside    Consultants:   PT  Procedures:   None   Antimicrobials:   None      Signed:  [name], MD  [facility] 01/08/2017, 8:40 AM  Pager #: [phone]  Time spent in minutes: more than 30 minutes   Discharge Exam: Vitals:   01/07/17 2126 01/08/17 0605  BP: (!) 143/81 (!) 144/68  Pulse: (!) 58 (!) 56  Resp: 20 18  Temp: 98 F (36.7 C) 97.6 F (36.4 C)  SpO2: 99% 100%   Vitals:   01/07/17 0500 01/07/17 1350 01/07/17 2126 01/08/17 0605  BP: (!) 165/81  (!) 143/81 (!) 144/68  Pulse: 62 60 (!) 58 (!) 56  Resp: 20 20 20 18   Temp: 98 F (36.7 C) 98.1 F (36.7 C) 98 F (36.7 C) 97.6 F (36.4 C)  TempSrc: Axillary Axillary Oral Oral   SpO2: 99% 99% 99% 100%  Weight:        General: Pt is alert, not in acute distress Cardiovascular: Regular rate and rhythm, S1/S2 + Respiratory: Clear to auscultation bilaterally, no wheezing Abdominal: Soft, non tender, non distended, bowel sounds +, no guarding Extremities: no cyanosis, pulses palpable bilaterally DP and PT Neuro: Grossly nonfocal  Discharge Instructions  Discharge Instructions    Call MD for:  persistant nausea and vomiting   Complete by:  As directed    Call MD for:  redness, tenderness, or signs of infection (pain, swelling, redness, odor or green/yellow discharge around incision site)   Complete by:  As directed    Call MD for:  severe uncontrolled pain   Complete by:  As directed    Diet - low sodium heart healthy   Complete by:  As directed    Increase activity slowly   Complete by:  As directed      Allergies as of 01/08/2017      Reactions   Penicillins Other (See Comments)   "didn't sit well" Has patient had a PCN reaction causing immediate rash, facial/tongue/throat swelling, SOB or lightheadedness with hypotension: No Has patient had a PCN reaction causing severe rash involving mucus membranes or skin necrosis: No Has patient had a PCN reaction that required hospitalization No Has patient had a PCN reaction occurring within the last 10 years: No If all of the above answers are "NO", then may proceed with Cephalosporin  use.      Medication List    STOP taking these medications   potassium chloride SA 20 MEQ tablet Commonly known as:  K-DUR,KLOR-CON     TAKE these medications   esomeprazole 40 MG capsule Commonly known as:  NEXIUM Take 40 mg by mouth every morning.   levothyroxine 100 MCG tablet Commonly known as:  SYNTHROID, LEVOTHROID Take 100 mcg by mouth daily before breakfast.   losartan 100 MG tablet Commonly known as:  COZAAR Take 100 mg by mouth daily.   nisoldipine 17 MG 24 hr tablet Commonly known as:  SULAR Take 17 mg  by mouth every morning.   NU-IRON 150 MG capsule Generic drug:  iron polysaccharides Take 150 mg by mouth See admin instructions. Take 150mg  by mouth in the morning Take 300mg  by mouth at lunch   PRISTIQ 50 MG 24 hr tablet Generic drug:  desvenlafaxine Take 50 mg by mouth every morning.   traZODone 50 MG tablet Commonly known as:  DESYREL Take 25 mg by mouth at bedtime.   Vitamin D (Ergocalciferol) 50000 units Caps capsule Commonly known as:  DRISDOL Take 50,000 Units by mouth every 7 (seven) days. Tuesday      Follow-up Information    [name], MD. Schedule an appointment as soon as possible for a visit.   Specialty:  Endocrinology Contact information: [address] [place] [address] [phone]            The results of significant diagnostics from this hospitalization (including imaging, microbiology, ancillary and laboratory) are listed below for reference.    Significant Diagnostic Studies: Dg Chest 1 View  Result Date: 01/06/2017 CLINICAL DATA:  Confusion, uncooperative, altered mental status, history hypertension, dementia EXAM: CHEST 1 VIEW COMPARISON:  Portable exam 1356 hours compared to 06/24/2016 FINDINGS: Upper normal size of cardiac silhouette. Mediastinal contours and pulmonary vascularity normal. Lungs clear. No pleural effusion or pneumothorax. Bones unremarkable. IMPRESSION: No acute abnormalities. Electronically Signed   By: Lavonia Dana M.D.   On: 01/06/2017 14:10   Ct Head Wo Contrast  Result Date: 01/06/2017 CLINICAL DATA:  Altered level of consciousness, combative, artery meta diff, history dementia, hypertension EXAM: CT HEAD WITHOUT CONTRAST TECHNIQUE: Contiguous axial images were obtained from the base of the skull through the vertex without intravenous contrast. Sagittal and coronal MPR images reconstructed from axial data set. COMPARISON:  04/22/2016 FINDINGS: Brain: Mild motion artifacts. Generalized atrophy. Normal ventricular  morphology. No midline shift or mass effect. Small vessel chronic ischemic changes of deep cerebral white matter. No intracranial hemorrhage, mass lesion or evidence of acute infarction. No extra-axial fluid collections. Vascular: Unremarkable Skull: Normal appearance Sinuses/Orbits: Small amount chronic fluid within the LEFT sphenoid sinus. Remaining visualized paranasal sinuses and mastoid air cells clear Other: N/A IMPRESSION: Atrophy with small vessel chronic ischemic changes of deep cerebral white matter. No acute intracranial abnormalities. Electronically Signed   By: Lavonia Dana M.D.   On: 01/06/2017 14:07    Microbiology: No results found for this or any previous visit (from the past 240 hour(s)).   Labs: Basic Metabolic Panel: Recent Labs  Lab 01/06/17 1414 01/06/17 2122 01/07/17 0626  NA 136  --  138  K 2.2*  --  4.1  CL 101  --  106  CO2 27  --  23  GLUCOSE 87  --  82  BUN 12  --  13  CREATININE 0.88  --  0.87  CALCIUM 9.6  --  9.3  MG  --  1.9  --    Liver Function Tests: Recent Labs  Lab 01/06/17 1414 01/07/17 0626  AST 18 18  ALT 8* 7*  ALKPHOS 58 47  BILITOT 0.4 0.6  PROT 7.9 6.8  ALBUMIN 4.1 3.6   No results for input(s): LIPASE, AMYLASE in the last 168 hours. Recent Labs  Lab 01/06/17 1414  AMMONIA 19   CBC: Recent Labs  Lab 01/06/17 1414 01/07/17 0626  WBC 7.7 6.5  HGB 13.1 11.9*  HCT 38.4 37.1  MCV 84.6 86.5  PLT 243 226   Cardiac Enzymes: No results for input(s): CKTOTAL, CKMB, CKMBINDEX, TROPONINI in the last 168 hours. BNP: BNP (last 3 results) No results for input(s): BNP in the last 8760 hours.  ProBNP (last 3 results) No results for input(s): PROBNP in the last 8760 hours.  CBG: No results for input(s): GLUCAP in the last 168 hours.

## 2017-01-08 NOTE — Clinical Social Work Note (Signed)
Clinical Social Work Assessment  Patient Details  Name: Brianna [name] MRN: [id number] Date of Birth: May 01, 1932  Date of referral:  01/08/17               Reason for consult:  Facility Placement, Discharge Planning                Permission sought to share information with:  Case Manager, Customer service manager, Family Supports Permission granted to share information::  Yes, Verbal Permission Granted  Name::     Mudlogger::     Relationship::  Daughter  Contact Information:     Housing/Transportation Living arrangements for the past 2 months:  Single Family Home Source of Information:  Adult Children Patient Interpreter Needed:  None Criminal Activity/Legal Involvement Pertinent to Current Situation/Hospitalization:  No - Comment as needed Significant Relationships:  Adult Children, Spouse Lives with:  Spouse Do you feel safe going back to the place where you live?  Yes Need for family participation in patient care:  Yes (Comment)  Care giving concerns:  No care giving concerns at the time of assessment.   Social Worker assessment / plan:  LCSW following for SNF palcement.  Patient admitted to hospital for Acute metabolic encephalopathy.  Patient has medical history of Alzheimer's dementia, hyperlipidemia, hypertension, hypothyroid.  LCSW met with patient at bedside. Patient's daughter present during assessment. Patient was not able to fully participate due to confusion.  Patient's daughter reports that at home patient ambulates without assistance. Patient reports that patient feds herself, but does not prepare meals. Patient bathes and dresses her self with limited assistance.  Patient lives at home with her husband who is similar in his ADLs according to patient's daughter. Patient's daughter lives next door and assist with managing meds and preparing meals for patient.   Patient and daughter are agreeable to SNF placement. Prefers [facility], as patient haas  been there before.   PLAN: Patient will go to SNF at dc.   Employment status:  Retired Nurse, adult PT Recommendations:  [place] / Referral to community resources:     Patient/Family's Response to care:  Patient's daughter expressed gratitude and thanks for LCSW visit.  Patient/Family's Understanding of and Emotional Response to Diagnosis, Current Treatment, and Prognosis:  Family has a clear understanding of diagnosis and agreeable to treatment plan.   Emotional Assessment Appearance:  Appears stated age Attitude/Demeanor/Rapport:    Affect (typically observed):  Calm Orientation:  Oriented to Self Alcohol / Substance use:  Not Applicable Psych involvement (Current and /or in the community):  No (Comment)  Discharge Needs  Concerns to be addressed:  No discharge needs identified Readmission within the last 30 days:  No Current discharge risk:  None Barriers to Discharge:  Continued Medical Work up   [employer], LCSW 01/08/2017, 11:52 AM

## 2017-01-09 DIAGNOSIS — E039 Hypothyroidism, unspecified: Secondary | ICD-10-CM | POA: Diagnosis not present

## 2017-01-09 DIAGNOSIS — G9341 Metabolic encephalopathy: Secondary | ICD-10-CM | POA: Diagnosis not present

## 2017-01-09 DIAGNOSIS — G301 Alzheimer's disease with late onset: Secondary | ICD-10-CM | POA: Diagnosis not present

## 2017-01-09 DIAGNOSIS — E034 Atrophy of thyroid (acquired): Secondary | ICD-10-CM | POA: Diagnosis not present

## 2017-01-09 DIAGNOSIS — D509 Iron deficiency anemia, unspecified: Secondary | ICD-10-CM | POA: Diagnosis not present

## 2017-01-09 DIAGNOSIS — I10 Essential (primary) hypertension: Secondary | ICD-10-CM | POA: Diagnosis not present

## 2017-01-09 DIAGNOSIS — R488 Other symbolic dysfunctions: Secondary | ICD-10-CM | POA: Diagnosis not present

## 2017-01-09 DIAGNOSIS — R451 Restlessness and agitation: Secondary | ICD-10-CM | POA: Diagnosis not present

## 2017-01-09 DIAGNOSIS — K219 Gastro-esophageal reflux disease without esophagitis: Secondary | ICD-10-CM | POA: Diagnosis not present

## 2017-01-09 DIAGNOSIS — D638 Anemia in other chronic diseases classified elsewhere: Secondary | ICD-10-CM | POA: Diagnosis not present

## 2017-01-09 DIAGNOSIS — M6281 Muscle weakness (generalized): Secondary | ICD-10-CM | POA: Diagnosis not present

## 2017-01-09 DIAGNOSIS — D508 Other iron deficiency anemias: Secondary | ICD-10-CM | POA: Diagnosis not present

## 2017-01-09 DIAGNOSIS — R293 Abnormal posture: Secondary | ICD-10-CM | POA: Diagnosis not present

## 2017-01-09 DIAGNOSIS — R4182 Altered mental status, unspecified: Secondary | ICD-10-CM | POA: Diagnosis not present

## 2017-01-09 DIAGNOSIS — R262 Difficulty in walking, not elsewhere classified: Secondary | ICD-10-CM | POA: Diagnosis not present

## 2017-01-09 DIAGNOSIS — F0281 Dementia in other diseases classified elsewhere with behavioral disturbance: Secondary | ICD-10-CM | POA: Diagnosis not present

## 2017-01-09 DIAGNOSIS — E876 Hypokalemia: Secondary | ICD-10-CM | POA: Diagnosis not present

## 2017-01-09 DIAGNOSIS — F0390 Unspecified dementia without behavioral disturbance: Secondary | ICD-10-CM | POA: Diagnosis not present

## 2017-01-09 DIAGNOSIS — R410 Disorientation, unspecified: Secondary | ICD-10-CM | POA: Diagnosis not present

## 2017-01-09 NOTE — Care Management Important Message (Signed)
Important Message  Patient Details  Name: Brianna [name] MRN: [id number] Date of Birth: 02-18-1932   Medicare Important Message Given:  Yes    Kerin Salen 01/09/2017, 10:58 AM

## 2017-01-09 NOTE — Clinical Social Work Placement (Signed)
    11:57 AM Patient and family chose bed at [facility]. LCSW confirmed bed with facility. [facility] will transport by [facility]. LCSW faxed dc docs via hub.  RN report number [phone].  [facility]  CLINICAL SOCIAL WORK PLACEMENT  NOTE  Date:  01/09/2017  Patient Details  Name: Brianna [name] MRN: [id number] Date of Birth: 08-28-1932  Clinical Social Work is seeking post-discharge placement for this patient at the [place] level of care (*CSW will initial, date and re-position this form in  chart as items are completed):  Yes   Patient/family provided with [place] Work Department's list of facilities offering this level of care within the geographic area requested by the patient (or if unable, by the patient's family).  Yes   Patient/family informed of their freedom to choose among providers that offer the needed level of care, that participate in Medicare, Medicaid or managed care program needed by the patient, have an available bed and are willing to accept the patient.  Yes   Patient/family informed of [facility]'s ownership interest in [facility] and [facility], as well as of the fact that they are under no obligation to receive care at these facilities.  PASRR submitted to EDS on       PASRR number received on 01/08/17     Existing PASRR number confirmed on 01/09/17     FL2 transmitted to all facilities in geographic area requested by pt/family on 01/08/17     FL2 transmitted to all facilities within larger geographic area on       Patient informed that his/her managed care company has contracts with or will negotiate with certain facilities, including the following:        Yes   Patient/family informed of bed offers received.  Patient chooses bed at [facility] and [place] recommends and patient chooses bed at [facility]    Patient to be transferred to [facility] on  01/09/17.  Patient to be transferred to facility by EMS     Patient family notified on 01/09/17 of transfer.  Name of family member notified:  Jocelyn Lamer, Daughter     PHYSICIAN       Additional Comment:    _______________________________________________ Servando Snare, LCSW 01/09/2017, 11:57 AM

## 2017-01-09 NOTE — Progress Notes (Signed)
Pt seen and examined at the bedside. Pt medically stable for discharge to SNF. Please refer to d/c summary completed 01/08/2017. No changes in medical management since 01/08/2017.  [name]  [facility] [phone]

## 2017-01-09 NOTE — Progress Notes (Signed)
Report given to [facility], Therapist, sports. No questions or concerns at this time. Patient transferred via [place].

## 2017-01-10 ENCOUNTER — Encounter: Payer: Self-pay | Admitting: Internal Medicine

## 2017-01-10 ENCOUNTER — Non-Acute Institutional Stay (SKILLED_NURSING_FACILITY): Payer: Medicare Other | Admitting: Internal Medicine

## 2017-01-10 DIAGNOSIS — F02818 Dementia in other diseases classified elsewhere, unspecified severity, with other behavioral disturbance: Secondary | ICD-10-CM

## 2017-01-10 DIAGNOSIS — G9341 Metabolic encephalopathy: Secondary | ICD-10-CM | POA: Diagnosis not present

## 2017-01-10 DIAGNOSIS — G301 Alzheimer's disease with late onset: Secondary | ICD-10-CM | POA: Diagnosis not present

## 2017-01-10 DIAGNOSIS — I10 Essential (primary) hypertension: Secondary | ICD-10-CM | POA: Diagnosis not present

## 2017-01-10 DIAGNOSIS — F0281 Dementia in other diseases classified elsewhere with behavioral disturbance: Secondary | ICD-10-CM

## 2017-01-10 DIAGNOSIS — E034 Atrophy of thyroid (acquired): Secondary | ICD-10-CM | POA: Diagnosis not present

## 2017-01-10 DIAGNOSIS — E876 Hypokalemia: Secondary | ICD-10-CM

## 2017-01-10 DIAGNOSIS — D508 Other iron deficiency anemias: Secondary | ICD-10-CM

## 2017-01-10 NOTE — Progress Notes (Signed)
: Provider: Noah Delaine. Sheppard Coil, MD  Location:  [place] Room Number: 104 Place of Service:  SNF [phone])  PCP: [name], MD Patient Care Team: [name], MD as PCP - General (Endocrinology)  Extended Emergency Contact Information Primary Emergency Contact: Crist Infante [place] of [place] Mobile Phone: [phone] Relation: Daughter Secondary Emergency Contact: Orlich,Charles Address: [address]          [place], [place] [address] Johnnette Litter of [place] Phone: [phone] Relation: Spouse     Allergies: Penicillins  Chief Complaint  Patient presents with  . New Admit To SNF    following hospitalization 01/06/17 to 62/95/28 acute metabolic encephalopathy.    HPI: Patient is 83 y.o. female with Alzheimer's dementia, hyperlipidemia, hypertension, and hypothyroidism, and who presented to the [facility] emergency department with increased confusion and mental status changes from home. Historically patient has had prior admission for metabolic encephalopathy related to profound hypokalemia. En route to the emergency department patient was noted to be combative requiring Versed. In the ED patient was found to have a potassium of 2.2. Patient was admitted to [facility] from 12/10-13 for acute metabolic encephalopathy. All testing done was negative, including CT head, except for low potassium as stated prior.. Low potassium was repleted and patient improved. Patient is admitted to skilled nursing facility with generalized weakness for OT/PT. While at skilled nursing facility patient will be followed for hypertension treated with Cozaar and Sular, hypothyroidism treated with Synthroid. And Anemia treated with iron.  Past Medical History:  Diagnosis Date  . Acute encephalopathy 01/06/2017  . Acute metabolic encephalopathy 05/11/2438  . Adenomatous colon polyp   . AKI (acute kidney injury) ([place]) 03/14/2015  . Alzheimer's  dementia with behavioral disturbance 10/11/2014  . Dementia   . Depression 05/06/2016  . Depression with anxiety   . DUB (dysfunctional uterine bleeding)   . Elevated cholesterol   . Hyperlipidemia   . Hypertension   . Insomnia 05/06/2016  . Iron deficiency anemia 10/26/2013  . Kidney stone   . Sleep apnea   . Thyroid disease    Hypothyroid  . Vitamin D deficiency 05/06/2016  . Weakness 02/13/2013    Past Surgical History:  Procedure Laterality Date  . ABDOMINAL HYSTERECTOMY    . APPENDECTOMY    . CHOLECYSTECTOMY    . Colon tumor-benign    . kidney stones      Allergies as of 01/10/2017      Reactions   Penicillins Other (See Comments)   "didn't sit well" Has patient had a PCN reaction causing immediate rash, facial/tongue/throat swelling, SOB or lightheadedness with hypotension: No Has patient had a PCN reaction causing severe rash involving mucus membranes or skin necrosis: No Has patient had a PCN reaction that required hospitalization No Has patient had a PCN reaction occurring within the last 10 years: No If all of the above answers are "NO", then may proceed with Cephalosporin use.      Medication List        Accurate as of 01/10/17  9:56 AM. Always use your most recent med list.          esomeprazole 40 MG capsule Commonly known as:  NEXIUM Take 40 mg by mouth every morning.   levothyroxine 100 MCG tablet Commonly known as:  SYNTHROID, LEVOTHROID Take 100 mcg by mouth daily before breakfast.   losartan 100 MG tablet Commonly known as:  COZAAR Take 100 mg by mouth daily.  nisoldipine 17 MG 24 hr tablet Commonly known as:  SULAR Take 17 mg by mouth every morning.   NU-IRON 150 MG capsule Generic drug:  iron polysaccharides Take 150 mg by mouth. Take 150mg  by mouth in the morning Take 300mg  by mouth at lunch   PRISTIQ 50 MG 24 hr tablet Generic drug:  desvenlafaxine Take 50 mg by mouth every morning.   traZODone 50 MG tablet Commonly known as:   DESYREL Take 25 mg by mouth at bedtime.   Vitamin D (Ergocalciferol) 50000 units Caps capsule Commonly known as:  DRISDOL Take 50,000 Units by mouth every 7 (seven) days. Tuesday       No orders of the defined types were placed in this encounter.   Immunization History  Administered Date(s) Administered  . Influenza-Unspecified 11/28/2012  . PPD Test 04/26/2016    Social History   Tobacco Use  . Smoking status: Never Smoker  . Smokeless tobacco: Never Used  Substance Use Topics  . Alcohol use: No    Family history is   Family History  Problem Relation Age of Onset  . Diabetes Mother   . Heart disease Father   . Hypertension Brother   . Heart disease Brother   . Diabetes type II Brother   . Diabetes type II Sister   . Thyroid disease Child   . Diabetes Child       Review of Systems  unable to obtain secondary to dementia; nurse-no acute concerns; patient's daughter-concern that patient is highly agitated at times and Seroquel doesn't work; patient was given Haldol in the hospital and that seemed to not to have any negative side effects    Vitals:   01/10/17 0948  BP: (!) 162/73  Pulse: 85  Resp: 18  Temp: 97.9 F (36.6 C)  SpO2: 98%    SpO2 Readings from Last 1 Encounters:  01/10/17 98%   Body mass index is 22.07 kg/m.     Physical Exam  GENERAL APPEARANCE: Alert,   No acute distress.  SKIN: No diaphoresis rash HEAD: Normocephalic, atraumatic  EYES: Conjunctiva/lids clear. Pupils round, reactive. EOMs intact.  EARS: External exam WNL, canals clear. Hearing grossly normal.  NOSE: No deformity or discharge.  MOUTH/THROAT: Lips w/o lesions  RESPIRATORY: Breathing is even, unlabored. Lung sounds are clear   CARDIOVASCULAR: Heart RRR no murmurs, rubs or gallops. No peripheral edema.   GASTROINTESTINAL: Abdomen is soft, non-tender, not distended w/ normal bowel sounds. GENITOURINARY: Bladder non tender, not distended  MUSCULOSKELETAL: No abnormal  joints or musculature NEUROLOGIC:  Cranial nerves 2-12 grossly intact. Moves all extremities  PSYCHIATRIC: Dementia, no behavioral issues at this time  Patient Active Problem List   Diagnosis Date Noted  . Acute encephalopathy 01/06/2017  . Pressure injury of skin 06/25/2016  . Hypokalemia 06/24/2016  . Depression 05/06/2016  . Vitamin D deficiency 05/06/2016  . Insomnia 05/06/2016  . Acute metabolic encephalopathy 15/05/6977  . Delirium 04/22/2016  . Leukocytosis 03/19/2015  . AKI (acute kidney injury) ([place]) 03/14/2015  . GERD (gastroesophageal reflux disease) 03/14/2015  . Diaper dermatitis 03/14/2015  . Syncope 03/08/2015  . Physical deconditioning 03/08/2015  . Alzheimer's dementia with behavioral disturbance 10/11/2014  . Iron deficiency anemia 10/26/2013  . Diarrhea 10/26/2013  . Near syncope 09/06/2013  . Hypothyroid 09/06/2013  . Hyponatremia 02/13/2013  . Weakness 02/13/2013  . 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 Hypersomnia with sleep apnea, unspecified 11/17/2012  . DUB (dysfunctional uterine bleeding)   .  Hypertension   . Dementia   . Elevated cholesterol   . Kidney stone       Labs reviewed: Basic Metabolic Panel:    Component Value Date/Time   NA 138 01/07/2017 0626   NA 137 05/03/2016   K 4.1 01/07/2017 0626   CL 106 01/07/2017 0626   CO2 23 01/07/2017 0626   GLUCOSE 82 01/07/2017 0626   BUN 13 01/07/2017 0626   BUN 14 05/03/2016   CREATININE 0.87 01/07/2017 0626   CALCIUM 9.3 01/07/2017 0626   PROT 6.8 01/07/2017 0626   PROT 6.8 04/28/2013 1021   ALBUMIN 3.6 01/07/2017 0626   ALBUMIN 4.4 04/28/2013 1021   AST 18 01/07/2017 0626   ALT 7 (L) 01/07/2017 0626   ALKPHOS 47 01/07/2017 0626   BILITOT 0.6 01/07/2017 0626   GFRNONAA >60 01/07/2017 0626   GFRAA >60 01/07/2017 0626    Recent Labs    06/24/16 0540  06/25/16 1207 07/31/16 1509 01/06/17 1414 01/06/17 2122 01/07/17 0626  NA 135   137   < > 139 135 136  --  138  K 2.2*  2.2*   < > 3.3* 3.4* 2.2*  --  4.1  CL 99*  98*   < > 104 104 101  --  106  CO2 26  --  23 22 27   --  23  GLUCOSE 105*  101*   < > 89 104* 87  --  82  BUN 12  11   < > 7 13 12   --  13  CREATININE 0.97  0.90   < > 0.88 0.91 0.88  --  0.87  CALCIUM 9.4  --  9.0 9.8 9.6  --  9.3  MG 1.9  --  1.8  --   --  1.9  --    < > = values in this interval not displayed.   Liver Function Tests: Recent Labs    07/31/16 1509 01/06/17 1414 01/07/17 0626  AST 14* 18 18  ALT 6* 8* 7*  ALKPHOS 62 58 47  BILITOT 0.5 0.4 0.6  PROT 7.8 7.9 6.8  ALBUMIN 3.9 4.1 3.6   Recent Labs    07/31/16 1509  LIPASE 34   Recent Labs    01/06/17 1414  AMMONIA 19   CBC: Recent Labs    04/25/16 1358  06/24/16 0529  07/31/16 1509 01/06/17 1414 01/07/17 0626  WBC 9.2   < > 8.0  --  10.3 7.7 6.5  NEUTROABS 5.9  --  4.5  --  7.3  --   --   HGB 12.7   < > 12.5   < > 13.0 13.1 11.9*  HCT 38.5   < > 36.9   < > 38.2 38.4 37.1  MCV 83.0  --  80.9  --  80.4 84.6 86.5  PLT 281   < > 248  --  243 243 226   < > = values in this interval not displayed.   Lipid No results for input(s): CHOL, HDL, LDLCALC, TRIG in the last 8760 hours.  Cardiac Enzymes: No results for input(s): CKTOTAL, CKMB, CKMBINDEX, TROPONINI in the last 8760 hours. BNP: No results for input(s): BNP in the last 8760 hours. No results found for: MICROALBUR No results found for: HGBA1C Lab Results  Component Value Date   TSH 2.465 06/24/2016   Lab Results  Component Value Date   VITAMINB12 243 04/22/2016   Lab Results  Component Value Date   FOLATE  11.0 03/08/2015   Lab Results  Component Value Date   IRON 11 (L) 03/08/2015   TIBC 241 (L) 03/08/2015   FERRITIN 222 03/08/2015    Imaging and Procedures obtained prior to SNF admission: Dg Chest 1 View  Result Date: 01/06/2017 CLINICAL DATA:  Confusion, uncooperative, altered mental status, history hypertension, dementia EXAM:  CHEST 1 VIEW COMPARISON:  Portable exam 1356 hours compared to 06/24/2016 FINDINGS: Upper normal size of cardiac silhouette. Mediastinal contours and pulmonary vascularity normal. Lungs clear. No pleural effusion or pneumothorax. Bones unremarkable. IMPRESSION: No acute abnormalities. Electronically Signed   By: Lavonia Dana M.D.   On: 01/06/2017 14:10   Ct Head Wo Contrast  Result Date: 01/06/2017 CLINICAL DATA:  Altered level of consciousness, combative, artery meta diff, history dementia, hypertension EXAM: CT HEAD WITHOUT CONTRAST TECHNIQUE: Contiguous axial images were obtained from the base of the skull through the vertex without intravenous contrast. Sagittal and coronal MPR images reconstructed from axial data set. COMPARISON:  04/22/2016 FINDINGS: Brain: Mild motion artifacts. Generalized atrophy. Normal ventricular morphology. No midline shift or mass effect. Small vessel chronic ischemic changes of deep cerebral white matter. No intracranial hemorrhage, mass lesion or evidence of acute infarction. No extra-axial fluid collections. Vascular: Unremarkable Skull: Normal appearance Sinuses/Orbits: Small amount chronic fluid within the LEFT sphenoid sinus. Remaining visualized paranasal sinuses and mastoid air cells clear Other: N/A IMPRESSION: Atrophy with small vessel chronic ischemic changes of deep cerebral white matter. No acute intracranial abnormalities. Electronically Signed   By: Lavonia Dana M.D.   On: 01/06/2017 14:07     Not all labs, radiology exams or other studies done during hospitalization come through on my EPIC note; however they are reviewed by me.    Assessment and Plan  ACUTE METABOLIC ENCEPHALOPATHY/ALZHEIMER'S DEMENTIA WITH BEHAVIORAL DISTURBANCE/HYPOKALEMIA-no acute finding on CT head; no laboratory findings except for potassium of 2.2 which was repleted SNF - admitted for OT/PT; will follow-up BMP; have also written an order for when necessary Haldol 1 mg for severe  agitation followed by another milligram an hour later for 2 weeks only  HYPERTENSION SNF - plan to continue Cozaar 100 mg by mouth daily and Sular 17 mg 24-hour tablet by mouth every morning  HYPOTHYROIDISM SNF-stable; plan to continue Synthroid 100 g by mouth daily  IRON DEFICIENCY ANEMIA SNF - stable; plan to continue Urine 150 mg every morning and 300 mg at lunch  Time spent greater than 45 minutes; spent time speaking with patient's daughter;> 50% of time with patient was spent reviewing records, labs, tests and studies, counseling and developing plan of care  Webb Silversmith D. Sheppard Coil, MD

## 2017-01-11 ENCOUNTER — Encounter: Payer: Self-pay | Admitting: Internal Medicine

## 2017-01-13 ENCOUNTER — Non-Acute Institutional Stay (SKILLED_NURSING_FACILITY): Payer: Medicare Other | Admitting: Internal Medicine

## 2017-01-13 ENCOUNTER — Encounter: Payer: Self-pay | Admitting: Internal Medicine

## 2017-01-13 DIAGNOSIS — R451 Restlessness and agitation: Secondary | ICD-10-CM

## 2017-01-13 DIAGNOSIS — R410 Disorientation, unspecified: Secondary | ICD-10-CM | POA: Diagnosis not present

## 2017-01-13 DIAGNOSIS — G301 Alzheimer's disease with late onset: Secondary | ICD-10-CM | POA: Diagnosis not present

## 2017-01-13 DIAGNOSIS — F02818 Dementia in other diseases classified elsewhere, unspecified severity, with other behavioral disturbance: Secondary | ICD-10-CM

## 2017-01-13 DIAGNOSIS — F0281 Dementia in other diseases classified elsewhere with behavioral disturbance: Secondary | ICD-10-CM | POA: Diagnosis not present

## 2017-01-13 NOTE — Progress Notes (Signed)
Location:  [place] Room Number: [place]:  SNF [phone])  Provider: Noah Delaine. Sheppard Coil, MD  [name], MD  Patient Care Team: [name], MD as PCP - General (Endocrinology)  Extended Emergency Contact Information Primary Emergency Contact: Crist Infante [place] of [place] Mobile Phone: [phone] Relation: Daughter Secondary Emergency Contact: Godsey,Charles Address: [address]          [place], [place] [address] Johnnette Litter of [place] Phone: [phone] Relation: Spouse    Allergies: Penicillins  Chief Complaint  Patient presents with  . 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 It is actually and possible nonsustained patient as she has been in the halls talking and yelling and walking and refusing to sit down causing everyone to be afraid that she is going to fall. It is known the patient responds to Haldol.  Past Medical History:  Diagnosis Date  . Acute encephalopathy 01/06/2017  . Acute metabolic encephalopathy 04/25/9240  . Adenomatous colon polyp   . AKI (acute kidney injury) ([place]) 03/14/2015  . Alzheimer's dementia with behavioral disturbance 10/11/2014  . Dementia   . Depression 05/06/2016  . Depression with anxiety   . DUB (dysfunctional uterine bleeding)   . Elevated cholesterol   . Hyperlipidemia   . Hypertension   . Insomnia 05/06/2016  . Iron deficiency anemia 10/26/2013  . Kidney stone   . Sleep apnea   . Thyroid disease    Hypothyroid  . Vitamin D deficiency 05/06/2016  . Weakness 02/13/2013    Past Surgical History:  Procedure Laterality Date  . ABDOMINAL HYSTERECTOMY    . APPENDECTOMY    . CHOLECYSTECTOMY    . Colon tumor-benign    . kidney stones      Allergies as of 01/13/2017      Reactions   Penicillins Other (See Comments)   "didn't sit well" Has patient had a PCN reaction causing  immediate rash, facial/tongue/throat swelling, SOB or lightheadedness with hypotension: No Has patient had a PCN reaction causing severe rash involving mucus membranes or skin necrosis: No Has patient had a PCN reaction that required hospitalization No Has patient had a PCN reaction occurring within the last 10 years: No If all of the above answers are "NO", then may proceed with Cephalosporin use.      Medication List        Accurate as of 01/13/17  3:28 PM. Always use your most recent med list.          esomeprazole 40 MG capsule Commonly known as:  NEXIUM Take 40 mg by mouth every morning.   levothyroxine 100 MCG tablet Commonly known as:  SYNTHROID, LEVOTHROID Take 100 mcg by mouth daily before breakfast.   losartan 100 MG tablet Commonly known as:  COZAAR Take 100 mg by mouth daily.   nisoldipine 17 MG 24 hr tablet Commonly known as:  SULAR Take 17 mg by mouth every morning.   NU-IRON 150 MG capsule Generic drug:  iron polysaccharides Take 150 mg by mouth. Take 150mg  by mouth in the morning Take 300mg  by mouth at lunch   PRISTIQ 50 MG 24 hr tablet Generic drug:  desvenlafaxine Take 50 mg by mouth every morning.   traZODone 50 MG tablet Commonly known as:  DESYREL Take 25 mg by mouth at bedtime.   Vitamin D (Ergocalciferol) 50000 units  Caps capsule Commonly known as:  DRISDOL Take 50,000 Units by mouth every 7 (seven) days. Tuesday       No orders of the defined types were placed in this encounter.   Immunization History  Administered Date(s) Administered  . Influenza-Unspecified 11/28/2012  . PPD Test 04/26/2016    Social History   Tobacco Use  . Smoking status: Never Smoker  . Smokeless tobacco: Never Used  Substance Use Topics  . Alcohol use: No    Review of Systems  unable to obtain secondary to agitation and confusion; nursing-no complaints prior to becoming confused and agitated    Vitals:   01/13/17 1523  BP: 140/80  Pulse: 62    Resp: 18  Temp: 97.8 F (36.6 C)  SpO2: 98%   Body mass index is 24.61 kg/m. Physical Exam  GENERAL APPEARANCE: Alert, conversant, agitation SKIN: No diaphoresis rash HEENT: Unremarkable RESPIRATORY: Breathing is even, unlabored. Lung sounds are clear   CARDIOVASCULAR: Heart RRR no murmurs, rubs or gallops. No peripheral edema  GASTROINTESTINAL: Abdomen is soft, non-tender, not distended w/ normal bowel sounds.  GENITOURINARY: Bladder non tender, not distended  MUSCULOSKELETAL: No abnormal joints or musculature NEUROLOGIC: Cranial nerves 2-12 grossly intact. Moves all extremities PSYCHIATRIC: Confusion and agitation  Patient Active Problem List   Diagnosis Date Noted  . Acute encephalopathy 01/06/2017  . Pressure injury of skin 06/25/2016  . Hypokalemia 06/24/2016  . Depression 05/06/2016  . Vitamin D deficiency 05/06/2016  . Insomnia 05/06/2016  . Acute metabolic encephalopathy 32/35/5732  . Delirium 04/22/2016  . Leukocytosis 03/19/2015  . AKI (acute kidney injury) ([place]) 03/14/2015  . GERD (gastroesophageal reflux disease) 03/14/2015  . Diaper dermatitis 03/14/2015  . Syncope 03/08/2015  . Physical deconditioning 03/08/2015  . Alzheimer's dementia with behavioral disturbance 10/11/2014  . Iron deficiency anemia 10/26/2013  . Diarrhea 10/26/2013  . Near syncope 09/06/2013  . Hypothyroid 09/06/2013  . Hyponatremia 02/13/2013  . Weakness 02/13/2013  . 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 Hypersomnia with sleep apnea, unspecified 11/17/2012  . DUB (dysfunctional uterine bleeding)   . Hypertension   . Dementia   . Elevated cholesterol   . Kidney stone     CMP     Component Value Date/Time   NA 138 01/07/2017 0626   NA 137 05/03/2016   K 4.1 01/07/2017 0626   CL 106 01/07/2017 0626   CO2 23 01/07/2017 0626   GLUCOSE 82 01/07/2017 0626   BUN 13 01/07/2017 0626   BUN 14 05/03/2016   CREATININE 0.87 01/07/2017  0626   CALCIUM 9.3 01/07/2017 0626   PROT 6.8 01/07/2017 0626   PROT 6.8 04/28/2013 1021   ALBUMIN 3.6 01/07/2017 0626   ALBUMIN 4.4 04/28/2013 1021   AST 18 01/07/2017 0626   ALT 7 (L) 01/07/2017 0626   ALKPHOS 47 01/07/2017 0626   BILITOT 0.6 01/07/2017 0626   GFRNONAA >60 01/07/2017 0626   GFRAA >60 01/07/2017 0626   Recent Labs    06/24/16 0540  06/25/16 1207 07/31/16 1509 01/06/17 1414 01/06/17 2122 01/07/17 0626  NA 135  137   < > 139 135 136  --  138  K 2.2*  2.2*   < > 3.3* 3.4* 2.2*  --  4.1  CL 99*  98*   < > 104 104 101  --  106  CO2 26  --  23 22 27   --  23  GLUCOSE 105*  101*   < > 89  104* 87  --  82  BUN 12  11   < > 7 13 12   --  13  CREATININE 0.97  0.90   < > 0.88 0.91 0.88  --  0.87  CALCIUM 9.4  --  9.0 9.8 9.6  --  9.3  MG 1.9  --  1.8  --   --  1.9  --    < > = values in this interval not displayed.   Recent Labs    07/31/16 1509 01/06/17 1414 01/07/17 0626  AST 14* 18 18  ALT 6* 8* 7*  ALKPHOS 62 58 47  BILITOT 0.5 0.4 0.6  PROT 7.8 7.9 6.8  ALBUMIN 3.9 4.1 3.6   Recent Labs    04/25/16 1358  06/24/16 0529  07/31/16 1509 01/06/17 1414 01/07/17 0626  WBC 9.2   < > 8.0  --  10.3 7.7 6.5  NEUTROABS 5.9  --  4.5  --  7.3  --   --   HGB 12.7   < > 12.5   < > 13.0 13.1 11.9*  HCT 38.5   < > 36.9   < > 38.2 38.4 37.1  MCV 83.0  --  80.9  --  80.4 84.6 86.5  PLT 281   < > 248  --  243 243 226   < > = values in this interval not displayed.   No results for input(s): CHOL, LDLCALC, TRIG in the last 8760 hours.  Invalid input(s): HCL No results found for: MICROALBUR Lab Results  Component Value Date   TSH 2.465 06/24/2016   No results found for: HGBA1C No results found for: CHOL, HDL, LDLCALC, LDLDIRECT, TRIG, CHOLHDL  Significant Diagnostic Results in last 30 days:  Dg Chest 1 View  Result Date: 01/06/2017 CLINICAL DATA:  Confusion, uncooperative, altered mental status, history hypertension, dementia EXAM: CHEST 1 VIEW  COMPARISON:  Portable exam 1356 hours compared to 06/24/2016 FINDINGS: Upper normal size of cardiac silhouette. Mediastinal contours and pulmonary vascularity normal. Lungs clear. No pleural effusion or pneumothorax. Bones unremarkable. IMPRESSION: No acute abnormalities. Electronically Signed   By: Lavonia Dana M.D.   On: 01/06/2017 14:10   Ct Head Wo Contrast  Result Date: 01/06/2017 CLINICAL DATA:  Altered level of consciousness, combative, artery meta diff, history dementia, hypertension EXAM: CT HEAD WITHOUT CONTRAST TECHNIQUE: Contiguous axial images were obtained from the base of the skull through the vertex without intravenous contrast. Sagittal and coronal MPR images reconstructed from axial data set. COMPARISON:  04/22/2016 FINDINGS: Brain: Mild motion artifacts. Generalized atrophy. Normal ventricular morphology. No midline shift or mass effect. Small vessel chronic ischemic changes of deep cerebral white matter. No intracranial hemorrhage, mass lesion or evidence of acute infarction. No extra-axial fluid collections. Vascular: Unremarkable Skull: Normal appearance Sinuses/Orbits: Small amount chronic fluid within the LEFT sphenoid sinus. Remaining visualized paranasal sinuses and mastoid air cells clear Other: N/A IMPRESSION: Atrophy with small vessel chronic ischemic changes of deep cerebral white matter. No acute intracranial abnormalities. Electronically Signed   By: Lavonia Dana M.D.   On: 01/06/2017 14:07    Assessment and Plan  Dementia with behavioral disturbance/confusion and agitation-have ordered that 1 mg of Haldol by mouth if possible  Later entry-patient took Haldol and is beginning to calm down and able to be put in her bed   Webb Silversmith D. Sheppard Coil, MD

## 2017-01-27 ENCOUNTER — Encounter: Payer: Self-pay | Admitting: Internal Medicine

## 2017-01-27 ENCOUNTER — Non-Acute Institutional Stay (SKILLED_NURSING_FACILITY): Payer: Medicare Other | Admitting: Internal Medicine

## 2017-01-27 DIAGNOSIS — G9341 Metabolic encephalopathy: Secondary | ICD-10-CM

## 2017-01-27 DIAGNOSIS — I10 Essential (primary) hypertension: Secondary | ICD-10-CM | POA: Diagnosis not present

## 2017-01-27 DIAGNOSIS — E034 Atrophy of thyroid (acquired): Secondary | ICD-10-CM | POA: Diagnosis not present

## 2017-01-27 DIAGNOSIS — F0281 Dementia in other diseases classified elsewhere with behavioral disturbance: Secondary | ICD-10-CM

## 2017-01-27 DIAGNOSIS — D508 Other iron deficiency anemias: Secondary | ICD-10-CM | POA: Diagnosis not present

## 2017-01-27 DIAGNOSIS — F02818 Dementia in other diseases classified elsewhere, unspecified severity, with other behavioral disturbance: Secondary | ICD-10-CM

## 2017-01-27 DIAGNOSIS — G301 Alzheimer's disease with late onset: Secondary | ICD-10-CM | POA: Diagnosis not present

## 2017-01-27 NOTE — Progress Notes (Signed)
Location:  [facility] Room Number: [place]:  SNF [phone])  Provider: Noah Delaine. Sheppard Coil, MD  PCP: [name], MD Patient Care Team: [name], MD as PCP - General (Endocrinology)  Extended Emergency Contact Information Primary Emergency Contact: Crist Infante [place] of [place] Mobile Phone: [phone] Relation: Daughter Secondary Emergency Contact: Hornback,Charles Address: [address]          [place], [place] [address] Johnnette Litter of [place] Phone: [phone] Relation: Spouse  Allergies  Allergen Reactions  . Penicillins Other (See Comments)    "didn't sit well" Has patient had a PCN reaction causing immediate rash, facial/tongue/throat swelling, SOB or lightheadedness with hypotension: No Has patient had a PCN reaction causing severe rash involving mucus membranes or skin necrosis: No Has patient had a PCN reaction that required hospitalization No Has patient had a PCN reaction occurring within the last 10 years: No If all of the above answers are "NO", then may proceed with Cephalosporin use.    Chief Complaint  Patient presents with  . Discharge Note    discharge from SNF to home    HPI:  83 y.o. female with Alzheimer's dementia, hyperlipidemia, hypertension, and hypothyroidism who was admitted to [facility] from 12/10-13 for acute metabolic encephalopathy and hypokalemia. Patient was admitted to skilled nursing facility with generalized weakness for OT/PT and is now ready to be discharged to home.    Past Medical History:  Diagnosis Date  . Acute encephalopathy 01/06/2017  . Acute metabolic encephalopathy 4/66/5993  . Adenomatous colon polyp   . AKI (acute kidney injury) ([place]) 03/14/2015  . Alzheimer's dementia with behavioral disturbance 10/11/2014  . Dementia   . Depression 05/06/2016  . Depression with anxiety   . DUB (dysfunctional uterine bleeding)   . Elevated cholesterol   .  Hyperlipidemia   . Hypertension   . Insomnia 05/06/2016  . Iron deficiency anemia 10/26/2013  . Kidney stone   . Sleep apnea   . Thyroid disease    Hypothyroid  . Vitamin D deficiency 05/06/2016  . Weakness 02/13/2013    Past Surgical History:  Procedure Laterality Date  . ABDOMINAL HYSTERECTOMY    . APPENDECTOMY    . CHOLECYSTECTOMY    . Colon tumor-benign    . kidney stones       reports that  has never smoked. she has never used smokeless tobacco. She reports that she does not drink alcohol or use drugs. Social History   Socioeconomic History  . Marital status: Married    Spouse name: [name]  . Number of children: 4  . Years of education: 8  . Highest education level: Not on file  Social Needs  . Financial resource strain: Not on file  . Food insecurity - worry: Not on file  . Food insecurity - inability: Not on file  . Transportation needs - medical: Not on file  . Transportation needs - non-medical: Not on file  Occupational History    Employer: RETIRED  Tobacco Use  . Smoking status: Never Smoker  . Smokeless tobacco: Never Used  Substance and Sexual Activity  . Alcohol use: No  . Drug use: No  . Sexual activity: No    Birth control/protection: Surgical  Other Topics Concern  . Not on file  Social History Narrative   Patient is married [name]).   Patient has four children.   Patient has an 8th grade education.   Patient is retired.  Patient is right-handed.   Patient drinks 3- 2 liters of Pepsi daily.      Admitted to [facility] 01/09/17   Never smoked   Alcohol none   Full Code    Pertinent  Health Maintenance Due  Topic Date Due  . PNA vac Low Risk Adult (1 of 2 - PCV13) 04/09/1999  . INFLUENZA VACCINE  08/28/2016  . DEXA SCAN  Completed  . PAP SMEAR  Discontinued    Medications: Allergies as of 01/27/2017      Reactions   Penicillins Other (See Comments)   "didn't sit well" Has patient had a PCN reaction causing  immediate rash, facial/tongue/throat swelling, SOB or lightheadedness with hypotension: No Has patient had a PCN reaction causing severe rash involving mucus membranes or skin necrosis: No Has patient had a PCN reaction that required hospitalization No Has patient had a PCN reaction occurring within the last 10 years: No If all of the above answers are "NO", then may proceed with Cephalosporin use.      Medication List        Accurate as of 01/27/17 12:31 PM. Always use your most recent med list.          esomeprazole 40 MG capsule Commonly known as:  NEXIUM Take 40 mg by mouth every morning.   levothyroxine 100 MCG tablet Commonly known as:  SYNTHROID, LEVOTHROID Take 100 mcg by mouth daily before breakfast.   losartan 100 MG tablet Commonly known as:  COZAAR Take 100 mg by mouth daily.   nisoldipine 17 MG 24 hr tablet Commonly known as:  SULAR Take 17 mg by mouth every morning.   NU-IRON 150 MG capsule Generic drug:  iron polysaccharides Take 150 mg by mouth. Take 150mg  by mouth in the morning Take 300mg  by mouth at lunch   nystatin cream Commonly known as:  MYCOSTATIN Apply 1 application topically. 1000,000 unit/gm apply between and under bilateral breast daily   PRISTIQ 50 MG 24 hr tablet Generic drug:  desvenlafaxine Take 50 mg by mouth every morning.   traZODone 50 MG tablet Commonly known as:  DESYREL Take 25 mg by mouth at bedtime.   Vitamin D (Ergocalciferol) 50000 units Caps capsule Commonly known as:  DRISDOL Take 50,000 Units by mouth every 7 (seven) days. Tuesday        Vitals:   01/27/17 1226  BP: (!) 115/57  Pulse: (!) 56  Resp: 20  Temp: (!) 97.5 F (36.4 C)  SpO2: 99%  Weight: 116 lb (52.6 kg)  Height: 5' (1.524 m)   Body mass index is 22.65 kg/m.  Physical Exam  GENERAL APPEARANCE: Alert,  No acute distress.  HEENT: Unremarkable. RESPIRATORY: Breathing is even, unlabored. Lung sounds are clear   CARDIOVASCULAR: Heart RRR no  murmurs, rubs or gallops. No peripheral edema.  GASTROINTESTINAL: Abdomen is soft, non-tender, not distended w/ normal bowel sounds.  NEUROLOGIC: Cranial nerves 2-12 grossly intact. Moves all extremities   Labs reviewed: Basic Metabolic Panel: Recent Labs    06/24/16 0540  06/25/16 1207 07/31/16 1509 01/06/17 1414 01/06/17 2122 01/07/17 0626  NA 135  137   < > 139 135 136  --  138  K 2.2*  2.2*   < > 3.3* 3.4* 2.2*  --  4.1  CL 99*  98*   < > 104 104 101  --  106  CO2 26  --  23 22 27   --  23  GLUCOSE 105*  101*   < >  89 104* 87  --  82  BUN 12  11   < > 7 13 12   --  13  CREATININE 0.97  0.90   < > 0.88 0.91 0.88  --  0.87  CALCIUM 9.4  --  9.0 9.8 9.6  --  9.3  MG 1.9  --  1.8  --   --  1.9  --    < > = values in this interval not displayed.   No results found for: [facility] Liver Function Tests: Recent Labs    07/31/16 1509 01/06/17 1414 01/07/17 0626  AST 14* 18 18  ALT 6* 8* 7*  ALKPHOS 62 58 47  BILITOT 0.5 0.4 0.6  PROT 7.8 7.9 6.8  ALBUMIN 3.9 4.1 3.6   Recent Labs    07/31/16 1509  LIPASE 34   Recent Labs    01/06/17 1414  AMMONIA 19   CBC: Recent Labs    04/25/16 1358  06/24/16 0529  07/31/16 1509 01/06/17 1414 01/07/17 0626  WBC 9.2   < > 8.0  --  10.3 7.7 6.5  NEUTROABS 5.9  --  4.5  --  7.3  --   --   HGB 12.7   < > 12.5   < > 13.0 13.1 11.9*  HCT 38.5   < > 36.9   < > 38.2 38.4 37.1  MCV 83.0  --  80.9  --  80.4 84.6 86.5  PLT 281   < > 248  --  243 243 226   < > = values in this interval not displayed.   Lipid No results for input(s): CHOL, HDL, LDLCALC, TRIG in the last 8760 hours. Cardiac Enzymes: No results for input(s): CKTOTAL, CKMB, CKMBINDEX, TROPONINI in the last 8760 hours. BNP: No results for input(s): BNP in the last 8760 hours. CBG: No results for input(s): GLUCAP in the last 8760 hours.  Procedures and Imaging Studies During Stay: Dg Chest 1 View  Result Date: 01/06/2017 CLINICAL DATA:  Confusion,  uncooperative, altered mental status, history hypertension, dementia EXAM: CHEST 1 VIEW COMPARISON:  Portable exam 1356 hours compared to 06/24/2016 FINDINGS: Upper normal size of cardiac silhouette. Mediastinal contours and pulmonary vascularity normal. Lungs clear. No pleural effusion or pneumothorax. Bones unremarkable. IMPRESSION: No acute abnormalities. Electronically Signed   By: Lavonia Dana M.D.   On: 01/06/2017 14:10   Ct Head Wo Contrast  Result Date: 01/06/2017 CLINICAL DATA:  Altered level of consciousness, combative, artery meta diff, history dementia, hypertension EXAM: CT HEAD WITHOUT CONTRAST TECHNIQUE: Contiguous axial images were obtained from the base of the skull through the vertex without intravenous contrast. Sagittal and coronal MPR images reconstructed from axial data set. COMPARISON:  04/22/2016 FINDINGS: Brain: Mild motion artifacts. Generalized atrophy. Normal ventricular morphology. No midline shift or mass effect. Small vessel chronic ischemic changes of deep cerebral white matter. No intracranial hemorrhage, mass lesion or evidence of acute infarction. No extra-axial fluid collections. Vascular: Unremarkable Skull: Normal appearance Sinuses/Orbits: Small amount chronic fluid within the LEFT sphenoid sinus. Remaining visualized paranasal sinuses and mastoid air cells clear Other: N/A IMPRESSION: Atrophy with small vessel chronic ischemic changes of deep cerebral white matter. No acute intracranial abnormalities. Electronically Signed   By: Lavonia Dana M.D.   On: 01/06/2017 14:07    Assessment/Plan:   No diagnosis found.   Patient is being discharged with the following home health services:  OT/PT/nursing  Patient is being discharged with the following durable medical equipment:  None  Patient has been advised to f/u with their PCP in 1-2 weeks to bring them up to date on their rehab stay.  Social services at facility was responsible for arranging this appointment.  Pt was  provided with a 30 day supply of prescriptions for medications and refills must be obtained from their PCP.  For controlled substances, a more limited supply may be provided adequate until PCP appointment only.  Medications have been reconciled.   Time spent greater than 30 minutes;> 50% of time with patient was spent reviewing records, labs, tests and studies, counseling and developing plan of care  Noah Delaine. Sheppard Coil, MD

## 2017-01-28 ENCOUNTER — Encounter: Payer: Self-pay | Admitting: Internal Medicine

## 2017-01-30 ENCOUNTER — Telehealth: Payer: Self-pay | Admitting: Family
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
## 2017-01-31 ENCOUNTER — Emergency Department (HOSPITAL_COMMUNITY)
Admission: EM | Admit: 2017-01-31 | Discharge: 2017-01-31 | Disposition: A | Discharge status: Home / Self Care | Payer: Medicare Other | Attending: Emergency Medicine | Admitting: Emergency Medicine

## 2017-01-31 ENCOUNTER — Emergency Department (HOSPITAL_COMMUNITY): Payer: Medicare Other

## 2017-01-31 ENCOUNTER — Encounter (HOSPITAL_COMMUNITY): Payer: Self-pay

## 2017-01-31 ENCOUNTER — Other Ambulatory Visit: Payer: Self-pay

## 2017-01-31 DIAGNOSIS — X58XXXA Exposure to other specified factors, initial encounter: Secondary | ICD-10-CM | POA: Diagnosis not present

## 2017-01-31 DIAGNOSIS — I10 Essential (primary) hypertension: Secondary | ICD-10-CM | POA: Diagnosis not present

## 2017-01-31 DIAGNOSIS — S0990XA Unspecified injury of head, initial encounter: Secondary | ICD-10-CM | POA: Diagnosis not present

## 2017-01-31 DIAGNOSIS — Y939 Activity, unspecified: Secondary | ICD-10-CM | POA: Insufficient documentation

## 2017-01-31 DIAGNOSIS — S0083XA Contusion of other part of head, initial encounter: Secondary | ICD-10-CM | POA: Insufficient documentation

## 2017-01-31 DIAGNOSIS — F0280 Dementia in other diseases classified elsewhere without behavioral disturbance: Secondary | ICD-10-CM | POA: Insufficient documentation

## 2017-01-31 DIAGNOSIS — M5090 Cervical disc disorder, unspecified, unspecified cervical region: Secondary | ICD-10-CM | POA: Diagnosis not present

## 2017-01-31 DIAGNOSIS — T148XXA Other injury of unspecified body region, initial encounter: Secondary | ICD-10-CM

## 2017-01-31 DIAGNOSIS — Z79899 Other long term (current) drug therapy: Secondary | ICD-10-CM | POA: Insufficient documentation

## 2017-01-31 DIAGNOSIS — G308 Other Alzheimer's disease: Secondary | ICD-10-CM | POA: Insufficient documentation

## 2017-01-31 DIAGNOSIS — Y999 Unspecified external cause status: Secondary | ICD-10-CM | POA: Insufficient documentation

## 2017-01-31 DIAGNOSIS — Y929 Unspecified place or not applicable: Secondary | ICD-10-CM | POA: Diagnosis not present

## 2017-01-31 DIAGNOSIS — S0081XA Abrasion of other part of head, initial encounter: Secondary | ICD-10-CM | POA: Diagnosis not present

## 2017-01-31 MED ORDER — HALOPERIDOL LACTATE 5 MG/ML IJ SOLN: 1.0000 mg | Freq: Once | INTRAMUSCULAR | Status: DC

## 2017-01-31 MED ORDER — HALOPERIDOL LACTATE 5 MG/ML IJ SOLN
1.0000 mg | Freq: Once | INTRAMUSCULAR | Status: AC
  Administered 2017-01-31: 1 mg via INTRAMUSCULAR
  Filled 2017-01-31: qty 1

## 2017-01-31 NOTE — ED Notes (Signed)
ED Provider at bedside. 

## 2017-01-31 NOTE — ED Notes (Signed)
CT CAME BY TO TRANSFER PT. CT REQUESTING MEDICATION TO TRANSFER PT

## 2017-01-31 NOTE — ED Notes (Signed)
EDPA JEFF Provider at bedside.

## 2017-01-31 NOTE — ED Provider Notes (Signed)
[facility] DEPT Provider Note   CSN: [id number] Arrival date & time: 01/31/17  1024    History   Chief Complaint Chief Complaint  Patient presents with  . Abrasion  . hematoma    HPI Brianna [name] is a 83 y.o. female.  HPI level 5 caveat due to dementia  84 year old female with a significant past medical history of dementia presents today with her daughter with complaints of head trauma.  Patient's daughter reports that she recently was discharged from rehabilitation this morning.  She notes that when she went to pick her up she noted an abrasion and hematoma to the chin and to the forehead.  She notes she saw her last night and these were not evident, staff at the facility unsure of what happened.  Patient was ambulatory at her baseline, mental status at baseline at that time.  She notes that she has been in the emergency room and is becoming aggravated with worsening dementia, this is typical of patient's behavior.  Patient without any other signs of trauma or complaints.  Daughter denies any infectious etiology including changes to urine, fever, nausea or vomiting.    Past Medical History:  Diagnosis Date  . Acute encephalopathy 01/06/2017  . Acute metabolic encephalopathy 6/31/4970  . Adenomatous colon polyp   . AKI (acute kidney injury) ([place]) 03/14/2015  . Alzheimer's dementia with behavioral disturbance 10/11/2014  . Dementia   . Depression 05/06/2016  . Depression with anxiety   . DUB (dysfunctional uterine bleeding)   . Elevated cholesterol   . Hyperlipidemia   . Hypertension   . Insomnia 05/06/2016  . Iron deficiency anemia 10/26/2013  . Kidney stone   . Sleep apnea   . Thyroid disease    Hypothyroid  . Vitamin D deficiency 05/06/2016  . Weakness 02/13/2013    Patient Active Problem List   Diagnosis Date Noted  . Acute encephalopathy 01/06/2017  . Pressure injury of skin 06/25/2016  . Hypokalemia 06/24/2016  . Depression 05/06/2016    . Vitamin D deficiency 05/06/2016  . Insomnia 05/06/2016  . Acute metabolic encephalopathy 26/37/8588  . Delirium 04/22/2016  . Leukocytosis 03/19/2015  . AKI (acute kidney injury) ([place]) 03/14/2015  . GERD (gastroesophageal reflux disease) 03/14/2015  . Diaper dermatitis 03/14/2015  . Syncope 03/08/2015  . Physical deconditioning 03/08/2015  . Alzheimer's dementia with behavioral disturbance 10/11/2014  . Iron deficiency anemia 10/26/2013  . Diarrhea 10/26/2013  . Near syncope 09/06/2013  . Hypothyroid 09/06/2013  . Hyponatremia 02/13/2013  . Weakness 02/13/2013  . 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 Hypersomnia with sleep apnea, unspecified 11/17/2012  . DUB (dysfunctional uterine bleeding)   . Hypertension   . Dementia   . Elevated cholesterol   . Kidney stone     Past Surgical History:  Procedure Laterality Date  . ABDOMINAL HYSTERECTOMY    . APPENDECTOMY    . CHOLECYSTECTOMY    . Colon tumor-benign    . kidney stones      OB History    Gravida Para Term Preterm AB Living   3 3 3     3    SAB TAB Ectopic Multiple Live Births                   Home Medications    Prior to Admission medications   Medication Sig Start Date End Date Taking? Authorizing Provider  esomeprazole (NEXIUM) 40 MG capsule Take 40 mg by mouth every  morning.    Yes [provider]  iron polysaccharides (NU-IRON) 150 MG capsule Take 150 mg by mouth. Take 150mg  by mouth in the morning Take 300mg  by mouth at lunch 06/02/12  Yes [provider]  levothyroxine (SYNTHROID, LEVOTHROID) 100 MCG tablet Take 100 mcg by mouth daily before breakfast.  07/30/15  Yes [provider]  losartan (COZAAR) 100 MG tablet Take 100 mg by mouth daily. 12/21/16  Yes [provider]  nisoldipine (SULAR) 17 MG 24 hr tablet Take 17 mg by mouth every morning.  03/13/09  Yes [provider]  nystatin cream (MYCOSTATIN) Apply 1  application topically. 1000,000 unit/gm apply between and under bilateral breast daily   Yes [provider]  PRISTIQ 50 MG 24 hr tablet Take 50 mg by mouth every morning.  04/05/13  Yes [provider]  traZODone (DESYREL) 50 MG tablet Take 25 mg by mouth at bedtime.  02/16/13  Yes [provider]  Vitamin D, Ergocalciferol, (DRISDOL) 50000 UNITS CAPS capsule Take 50,000 Units by mouth every 7 (seven) days. Tuesday   Yes [provider]  haloperidol (HALDOL) 1 MG tablet Take 1 mg by mouth daily as needed for agitation. #7- for severe agitation    [provider]    Family History Family History  Problem Relation Age of Onset  . Diabetes Mother   . Heart disease Father   . Hypertension Brother   . Heart disease Brother   . Diabetes type II Brother   . Diabetes type II Sister   . Thyroid disease Child   . Diabetes Child     Social History Social History   Tobacco Use  . Smoking status: Never Smoker  . Smokeless tobacco: Never Used  Substance Use Topics  . Alcohol use: No  . Drug use: No     Allergies   Penicillins   Review of Systems Review of Systems  All other systems reviewed and are negative.  Physical Exam Updated Vital Signs BP (!) 184/66   Pulse (!) 58   Temp (!) 97.5 F (36.4 C) (Oral)   Resp 16   Ht 5' (1.524 m)   Wt 52.6 kg (116 lb)   SpO2 100%   BMI 22.65 kg/m   Physical Exam  Constitutional: She is oriented to person, place, and time. She appears well-developed and well-nourished.  HENT:  Head: Normocephalic and atraumatic.  Small hematoma to the right forehead-small abrasion and bruising to the chin-all with full active range of motion, no obvious signs of dental trauma  Eyes: Conjunctivae are normal. Pupils are equal, round, and reactive to light. Right eye exhibits no discharge. Left eye exhibits no discharge. No scleral icterus.  Neck: Normal range of motion. No JVD present. No tracheal deviation  present.  Pulmonary/Chest: Effort normal. No stridor.  Musculoskeletal:  No CT or L-spine tenderness to palpation, hip stable bilateral patient ambulates without antalgic gait  Neurological: She is alert and oriented to person, place, and time. Coordination normal.  Demented-continues  yelling at staff-was all extremities without signs of coordination loss or weakness  Psychiatric: She has a normal mood and affect. Her behavior is normal. Judgment and thought content normal.  Nursing note and vitals reviewed.   ED Treatments / Results  Labs (all labs ordered are listed, but only abnormal results are displayed) Labs Reviewed - No data to display  EKG  EKG Interpretation None       Radiology Ct Head Wo Contrast  Result  Date: 01/31/2017 CLINICAL DATA:  Abrasion to chain and right forehead. EXAM: CT HEAD WITHOUT CONTRAST CT CERVICAL SPINE WITHOUT CONTRAST TECHNIQUE: Multidetector CT imaging of the head and cervical spine was performed following the standard protocol without intravenous contrast. Multiplanar CT image reconstructions of the cervical spine were also generated. COMPARISON:  Head CT 01/06/2017.  Cervical spine CT 03/08/2015 FINDINGS: CT HEAD FINDINGS Brain: There is no evidence for acute hemorrhage, hydrocephalus, mass lesion, or abnormal extra-axial fluid collection. No definite CT evidence for acute infarction. Diffuse loss of parenchymal volume is consistent with atrophy. Patchy low attenuation in the deep hemispheric and periventricular white matter is nonspecific, but likely reflects chronic microvascular ischemic demyelination. Vascular: No hyperdense vessel or unexpected calcification. Skull: No evidence for fracture. No worrisome lytic or sclerotic lesion. Sinuses/Orbits: Chronic opacity left sphenoid sinus.The remaining visualized paranasal sinuses and mastoid air cells are clear. Visualized portions of the globes and intraorbital fat are unremarkable. Other: None. CT  CERVICAL SPINE FINDINGS Alignment: Straightening of normal cervical lordosis. No evidence for subluxation. Skull base and vertebrae: No acute fracture. No primary bone lesion or focal pathologic process. Soft tissues and spinal canal: No prevertebral fluid or swelling. No visible canal hematoma. Disc levels: Diffuse loss of intervertebral disc height with degenerative change again noted. Upper chest: Negative. Other: None. IMPRESSION: 1. No acute intracranial abnormality. Atrophy with chronic small vessel white matter ischemic disease. 2. No cervical spine fracture. Loss of cervical lordosis. This can be related to patient positioning, muscle spasm or soft tissue injury. 3. Diffuse degenerative disc disease in the mid and lower cervical spine. Electronically Signed   By: Misty Stanley M.D.   On: 01/31/2017 19:11   Ct Cervical Spine Wo Contrast  Result Date: 01/31/2017 CLINICAL DATA:  Abrasion to chain and right forehead. EXAM: CT HEAD WITHOUT CONTRAST CT CERVICAL SPINE WITHOUT CONTRAST TECHNIQUE: Multidetector CT imaging of the head and cervical spine was performed following the standard protocol without intravenous contrast. Multiplanar CT image reconstructions of the cervical spine were also generated. COMPARISON:  Head CT 01/06/2017.  Cervical spine CT 03/08/2015 FINDINGS: CT HEAD FINDINGS Brain: There is no evidence for acute hemorrhage, hydrocephalus, mass lesion, or abnormal extra-axial fluid collection. No definite CT evidence for acute infarction. Diffuse loss of parenchymal volume is consistent with atrophy. Patchy low attenuation in the deep hemispheric and periventricular white matter is nonspecific, but likely reflects chronic microvascular ischemic demyelination. Vascular: No hyperdense vessel or unexpected calcification. Skull: No evidence for fracture. No worrisome lytic or sclerotic lesion. Sinuses/Orbits: Chronic opacity left sphenoid sinus.The remaining visualized paranasal sinuses and mastoid  air cells are clear. Visualized portions of the globes and intraorbital fat are unremarkable. Other: None. CT CERVICAL SPINE FINDINGS Alignment: Straightening of normal cervical lordosis. No evidence for subluxation. Skull base and vertebrae: No acute fracture. No primary bone lesion or focal pathologic process. Soft tissues and spinal canal: No prevertebral fluid or swelling. No visible canal hematoma. Disc levels: Diffuse loss of intervertebral disc height with degenerative change again noted. Upper chest: Negative. Other: None. IMPRESSION: 1. No acute intracranial abnormality. Atrophy with chronic small vessel white matter ischemic disease. 2. No cervical spine fracture. Loss of cervical lordosis. This can be related to patient positioning, muscle spasm or soft tissue injury. 3. Diffuse degenerative disc disease in the mid and lower cervical spine. Electronically Signed   By: Misty Stanley M.D.   On: 01/31/2017 19:11    Procedures Procedures (including critical care time)  Medications Ordered in ED Medications  haloperidol lactate (HALDOL) injection 1 mg (1 mg Intramuscular Given 01/31/17 1713)     Initial Impression / Assessment and Plan / ED Course  I have reviewed the triage vital signs and the nursing notes.  Pertinent labs & imaging results that were available during my care of the patient were reviewed by me and considered in my medical decision making (see chart for details).     Final Clinical Impressions(s) / ED Diagnoses   Final diagnoses:  Injury of head, initial encounter  Hematoma    Labs:   Imaging: CT head without, CT cervical without  Consults:  Therapeutics:  Discharge Meds:   Assessment/Plan: 84 year old female presents with signs of fall.  She has a hematoma in her abrasion, no lacerations, no bony abnormalities.  CT without acute findings.  Patient in no acute distress, she will be discharged with her daughter with return precautions and follow-up information.   They verbalized understanding and agreement to today's plan had no further questions or concerns at the time of discharge.     ED Discharge Orders    None       Francee Gentile 01/31/17 2159    Little, Wenda Overland, MD 01/31/17 [phone]

## 2017-01-31 NOTE — ED Triage Notes (Signed)
Patient is a resident of [facility]. Patient has dementia. Patient's daughter reports that that when she went to see her mother and noticed that she had an abrasion to her chin and a hematoma to the right forehead area. Patient is not on blood thinners. Patient denies any pain.

## 2017-01-31 NOTE — Discharge Instructions (Signed)
Please read attached information. If you experience any new or worsening signs or symptoms please return to the emergency room for evaluation. Please follow-up with your primary care provider or specialist as discussed.  °

## 2017-02-03 DIAGNOSIS — F0281 Dementia in other diseases classified elsewhere with behavioral disturbance: Secondary | ICD-10-CM | POA: Diagnosis not present

## 2017-02-03 DIAGNOSIS — I10 Essential (primary) hypertension: Secondary | ICD-10-CM | POA: Diagnosis not present

## 2017-02-03 DIAGNOSIS — Z9181 History of falling: Secondary | ICD-10-CM | POA: Diagnosis not present

## 2017-02-03 DIAGNOSIS — D508 Other iron deficiency anemias: Secondary | ICD-10-CM | POA: Diagnosis not present

## 2017-02-03 DIAGNOSIS — G309 Alzheimer's disease, unspecified: Secondary | ICD-10-CM | POA: Diagnosis not present

## 2017-02-03 DIAGNOSIS — D509 Iron deficiency anemia, unspecified: Secondary | ICD-10-CM | POA: Diagnosis not present

## 2017-02-03 DIAGNOSIS — F418 Other specified anxiety disorders: Secondary | ICD-10-CM | POA: Diagnosis not present

## 2017-02-04 DIAGNOSIS — E876 Hypokalemia: Secondary | ICD-10-CM | POA: Diagnosis not present

## 2017-02-04 DIAGNOSIS — E038 Other specified hypothyroidism: Secondary | ICD-10-CM | POA: Diagnosis not present

## 2017-02-04 DIAGNOSIS — G9341 Metabolic encephalopathy: Secondary | ICD-10-CM | POA: Diagnosis not present

## 2017-02-04 DIAGNOSIS — G309 Alzheimer's disease, unspecified: Secondary | ICD-10-CM | POA: Diagnosis not present

## 2017-02-04 DIAGNOSIS — Z6822 Body mass index (BMI) 22.0-22.9, adult: Secondary | ICD-10-CM | POA: Diagnosis not present

## 2017-02-04 DIAGNOSIS — I10 Essential (primary) hypertension: Secondary | ICD-10-CM | POA: Diagnosis not present

## 2017-02-06 DIAGNOSIS — F418 Other specified anxiety disorders: Secondary | ICD-10-CM | POA: Diagnosis not present

## 2017-02-06 DIAGNOSIS — I10 Essential (primary) hypertension: Secondary | ICD-10-CM | POA: Diagnosis not present

## 2017-02-06 DIAGNOSIS — D509 Iron deficiency anemia, unspecified: Secondary | ICD-10-CM | POA: Diagnosis not present

## 2017-02-06 DIAGNOSIS — G309 Alzheimer's disease, unspecified: Secondary | ICD-10-CM | POA: Diagnosis not present

## 2017-02-06 DIAGNOSIS — Z9181 History of falling: Secondary | ICD-10-CM | POA: Diagnosis not present

## 2017-02-06 DIAGNOSIS — F0281 Dementia in other diseases classified elsewhere with behavioral disturbance: Secondary | ICD-10-CM | POA: Diagnosis not present

## 2017-02-12 DIAGNOSIS — Z9181 History of falling: Secondary | ICD-10-CM | POA: Diagnosis not present

## 2017-02-12 DIAGNOSIS — D509 Iron deficiency anemia, unspecified: Secondary | ICD-10-CM | POA: Diagnosis not present

## 2017-02-12 DIAGNOSIS — F0281 Dementia in other diseases classified elsewhere with behavioral disturbance: Secondary | ICD-10-CM | POA: Diagnosis not present

## 2017-02-12 DIAGNOSIS — I10 Essential (primary) hypertension: Secondary | ICD-10-CM | POA: Diagnosis not present

## 2017-02-12 DIAGNOSIS — G309 Alzheimer's disease, unspecified: Secondary | ICD-10-CM | POA: Diagnosis not present

## 2017-02-12 DIAGNOSIS — F418 Other specified anxiety disorders: Secondary | ICD-10-CM | POA: Diagnosis not present

## 2017-02-15 ENCOUNTER — Encounter: Payer: Self-pay | Admitting: Internal Medicine

## 2017-02-17 NOTE — Progress Notes (Deleted)
[facility]  PATIENT: Brianna [name] DOB: Oct 08, 1932   REASON FOR VISIT: *** HISTORY FROM:    HISTORY OF PRESENT ILLNESS:84 year old caucasian female, who is a long established patient in our practice and carries the diagnosis of dementia, . MRI 2015 showed Mesio-temporal atrophy indicative of memory loss disorder , As in Alzheimer's. small vessel disease has progressed since 2007. She has been on Excelon and Ambien and could not tolerate it, changed to Galantamine ER at 16 mg and she has not had side effects. Last EEG 2013 was remarkably normal .  Brianna [name] is no longer independent with ADL's.She requires 24/7 assistance . She is more and ore unable  to walk. Daughter requests PT for gait training.  Her Mini-Mental status exam score was the same from 2014 through 2016 at 20 out of 30.  Now declined to 8 out of 30. She no longer recognizes her daughter. She is unable to follow commands. She stated " I don;t have a husband" but she lives with her spouse. Repeatedly stated ' my back is killing me " and " I  can't understand what you said " She takes trazodone for sleep.    MMSE - Mini Mental State Exam 09/10/2016 10/06/2015 06/28/2015  Orientation to time 0 1 0  Orientation to Place 1 0 2  Registration 1 3 2   Attention/ Calculation 0 0 2  Attention/Calculation-comments pt didnt even try - -  Recall 0 0 0  Language- name 2 objects 2 1 2   Language- repeat 1 1 1   Language- follow 3 step command 3 3 3   Language- read & follow direction 0 0 0  Write a sentence 0 0 0  Write a sentence-comments pt didnt even try - -  Copy design 0 0 0  Copy design-comments pt said she couldnt see it to draw - -  Total score 8 9 12       REVIEW OF SYSTEMS: Full 14 system review of systems performed and notable only for those listed, all others are neg:  Constitutional: neg  Cardiovascular: neg Ear/Nose/Throat: neg  Skin: neg Eyes: neg Respiratory:  neg Gastroitestinal: neg  Hematology/Lymphatic: neg  Endocrine: neg Musculoskeletal:neg Allergy/Immunology: neg Neurological: neg Psychiatric: neg Sleep : neg   ALLERGIES: Allergies  Allergen Reactions  . Penicillins Other (See Comments)    "didn't sit well" Has patient had a PCN reaction causing immediate rash, facial/tongue/throat swelling, SOB or lightheadedness with hypotension: No Has patient had a PCN reaction causing severe rash involving mucus membranes or skin necrosis: No Has patient had a PCN reaction that required hospitalization No Has patient had a PCN reaction occurring within the last 10 years: No If all of the above answers are "NO", then may proceed with Cephalosporin use.    HOME MEDICATIONS: Outpatient Medications Prior to Visit  Medication Sig Dispense Refill  . esomeprazole (NEXIUM) 40 MG capsule Take 40 mg by mouth every morning.     . haloperidol (HALDOL) 1 MG tablet Take 1 mg by mouth daily as needed for agitation. #7- for severe agitation    . iron polysaccharides (NU-IRON) 150 MG capsule Take 150 mg by mouth. Take 150mg  by mouth in the morning Take 300mg  by mouth at lunch    . levothyroxine (SYNTHROID, LEVOTHROID) 100 MCG tablet Take 100 mcg by mouth daily before breakfast.   5  . losartan (COZAAR) 100 MG tablet Take 100 mg by mouth daily.  6  . nisoldipine (SULAR) 17 MG 24  hr tablet Take 17 mg by mouth every morning.     . nystatin cream (MYCOSTATIN) Apply 1 application topically. 1000,000 unit/gm apply between and under bilateral breast daily    . PRISTIQ 50 MG 24 hr tablet Take 50 mg by mouth every morning.     . traZODone (DESYREL) 50 MG tablet Take 25 mg by mouth at bedtime.     . Vitamin D, Ergocalciferol, (DRISDOL) 50000 UNITS CAPS capsule Take 50,000 Units by mouth every 7 (seven) days. Tuesday     No facility-administered medications prior to visit.     PAST MEDICAL HISTORY: Past Medical History:  Diagnosis Date  . Acute encephalopathy  01/06/2017  . Acute metabolic encephalopathy 2/59/5638  . Adenomatous colon polyp   . AKI (acute kidney injury) ([place]) 03/14/2015  . Alzheimer's dementia with behavioral disturbance 10/11/2014  . Dementia   . Depression 05/06/2016  . Depression with anxiety   . DUB (dysfunctional uterine bleeding)   . Elevated cholesterol   . Hyperlipidemia   . Hypertension   . Insomnia 05/06/2016  . Iron deficiency anemia 10/26/2013  . Kidney stone   . Sleep apnea   . Thyroid disease    Hypothyroid  . Vitamin D deficiency 05/06/2016  . Weakness 02/13/2013    PAST SURGICAL HISTORY: Past Surgical History:  Procedure Laterality Date  . ABDOMINAL HYSTERECTOMY    . APPENDECTOMY    . CHOLECYSTECTOMY    . Colon tumor-benign    . kidney stones      FAMILY HISTORY: Family History  Problem Relation Age of Onset  . Diabetes Mother   . Heart disease Father   . Hypertension Brother   . Heart disease Brother   . Diabetes type II Brother   . Diabetes type II Sister   . Thyroid disease Child   . Diabetes Child     SOCIAL HISTORY: Social History   Socioeconomic History  . Marital status: Married    Spouse name: Brianna [name]  . Number of children: 4  . Years of education: 8  . Highest education level: Not on file  Social Needs  . Financial resource strain: Not on file  . Food insecurity - worry: Not on file  . Food insecurity - inability: Not on file  . Transportation needs - medical: Not on file  . Transportation needs - non-medical: Not on file  Occupational History    Employer: RETIRED  Tobacco Use  . Smoking status: Never Smoker  . Smokeless tobacco: Never Used  Substance and Sexual Activity  . Alcohol use: No  . Drug use: No  . Sexual activity: No    Birth control/protection: Surgical  Other Topics Concern  . Not on file  Social History Narrative   Patient is married Brianna [name]).   Patient has four children.   Patient has an 8th grade education.   Patient is retired.   Patient is  right-handed.   Patient drinks 3- 2 liters of Pepsi daily.      Admitted to [facility] 01/09/17   Never smoked   Alcohol none   Full Code     PHYSICAL EXAM  There were no vitals filed for this visit. There is no height or weight on file to calculate BMI.  Generalized: Well developed, in no acute distress  Head: normocephalic and atraumatic,. Oropharynx benign  Neck: Supple, no carotid bruits  Cardiac: Regular rate rhythm, no murmur  Musculoskeletal: No deformity   Neurological examination   Mentation: Alert  oriented to time, place, history taking. Attention span and concentration appropriate. Recent and remote memory intact.  Follows all commands speech and language fluent.   Cranial nerve II-XII: Fundoscopic exam reveals sharp disc margins.Pupils were equal round reactive to light extraocular movements were full, visual field were full on confrontational test. Facial sensation and strength were normal. hearing was intact to finger rubbing bilaterally. Uvula tongue midline. head turning and shoulder shrug were normal and symmetric.Tongue protrusion into cheek strength was normal. Motor: normal bulk and tone, full strength in the BUE, BLE, fine finger movements normal, no pronator drift. No focal weakness Sensory: normal and symmetric to light touch, pinprick, and  Vibration, proprioception  Coordination: finger-nose-finger, heel-to-shin bilaterally, no dysmetria Reflexes: Brachioradialis 2/2, biceps 2/2, triceps 2/2, patellar 2/2, Achilles 2/2, plantar responses were flexor bilaterally. Gait and Station: Rising up from seated position without assistance, normal stance,  moderate stride, good arm swing, smooth turning, able to perform tiptoe, and heel walking without difficulty. Tandem gait is steady  DIAGNOSTIC DATA (LABS, IMAGING, TESTING) - I reviewed patient records, labs, notes, testing and imaging myself where available.  Lab Results  Component Value Date    WBC 6.5 01/07/2017   HGB 11.9 (L) 01/07/2017   HCT 37.1 01/07/2017   MCV 86.5 01/07/2017   PLT 226 01/07/2017      Component Value Date/Time   NA 138 01/07/2017 0626   NA 137 05/03/2016   K 4.1 01/07/2017 0626   CL 106 01/07/2017 0626   CO2 23 01/07/2017 0626   GLUCOSE 82 01/07/2017 0626   BUN 13 01/07/2017 0626   BUN 14 05/03/2016   CREATININE 0.87 01/07/2017 0626   CALCIUM 9.3 01/07/2017 0626   PROT 6.8 01/07/2017 0626   PROT 6.8 04/28/2013 1021   ALBUMIN 3.6 01/07/2017 0626   ALBUMIN 4.4 04/28/2013 1021   AST 18 01/07/2017 0626   ALT 7 (L) 01/07/2017 0626   ALKPHOS 47 01/07/2017 0626   BILITOT 0.6 01/07/2017 0626   GFRNONAA >60 01/07/2017 0626   GFRAA >60 01/07/2017 0626   No results found for: CHOL, HDL, LDLCALC, LDLDIRECT, TRIG, CHOLHDL No results found for: HGBA1C Lab Results  Component Value Date   VITAMINB12 243 04/22/2016   Lab Results  Component Value Date   TSH 2.465 06/24/2016    ***  ASSESSMENT AND PLAN   84 y.o. year old female  has a past medical history of Dementia of Alzheimer's disease as well as Vascular Dementia component. The patient has a severe dementia. ;   MRI brain showed Mesio-temporal atrophy indicative of memory loss disorder in 2015  . Her primary care physician has restricted her fluid intake to help her preserve a normal sodium level.  For while she had been seen at the hospital ED every other week with hyponatremia related spells of loss of consciousness, awareness changes, spells that looked like seizures.  Continue Razadyne at current dose, I  will refill for memory loss. We will monitor MMSE every 6 months Perform physical therapy exercises daily, this will decrease risk of falls Walk for overall health and well-being. Daughter initiates daily exercises.      Dennie Bible, [facility], [facility], APRN  [facility] [address] [place], [place] [address] [phone]

## 2017-02-18 ENCOUNTER — Ambulatory Visit: Payer: Medicare Other | Admitting: Nurse Practitioner

## 2017-02-19 DIAGNOSIS — F0281 Dementia in other diseases classified elsewhere with behavioral disturbance: Secondary | ICD-10-CM | POA: Diagnosis not present

## 2017-02-19 DIAGNOSIS — I10 Essential (primary) hypertension: Secondary | ICD-10-CM | POA: Diagnosis not present

## 2017-02-19 DIAGNOSIS — G309 Alzheimer's disease, unspecified: Secondary | ICD-10-CM | POA: Diagnosis not present

## 2017-02-19 DIAGNOSIS — Z9181 History of falling: Secondary | ICD-10-CM | POA: Diagnosis not present

## 2017-02-19 DIAGNOSIS — D509 Iron deficiency anemia, unspecified: Secondary | ICD-10-CM | POA: Diagnosis not present

## 2017-02-19 DIAGNOSIS — F418 Other specified anxiety disorders: Secondary | ICD-10-CM | POA: Diagnosis not present

## 2017-02-24 DIAGNOSIS — E7849 Other hyperlipidemia: Secondary | ICD-10-CM | POA: Diagnosis not present

## 2017-02-24 DIAGNOSIS — G308 Other Alzheimer's disease: Secondary | ICD-10-CM | POA: Diagnosis not present

## 2017-02-24 DIAGNOSIS — Z6823 Body mass index (BMI) 23.0-23.9, adult: Secondary | ICD-10-CM | POA: Diagnosis not present

## 2017-02-24 DIAGNOSIS — E876 Hypokalemia: Secondary | ICD-10-CM | POA: Diagnosis not present

## 2017-02-24 DIAGNOSIS — R269 Unspecified abnormalities of gait and mobility: Secondary | ICD-10-CM | POA: Diagnosis not present

## 2017-02-24 DIAGNOSIS — E871 Hypo-osmolality and hyponatremia: Secondary | ICD-10-CM | POA: Diagnosis not present

## 2017-02-24 DIAGNOSIS — E038 Other specified hypothyroidism: Secondary | ICD-10-CM | POA: Diagnosis not present

## 2017-02-24 DIAGNOSIS — Z1389 Encounter for screening for other disorder: Secondary | ICD-10-CM | POA: Diagnosis not present

## 2017-02-24 DIAGNOSIS — D510 Vitamin B12 deficiency anemia due to intrinsic factor deficiency: Secondary | ICD-10-CM | POA: Diagnosis not present

## 2017-02-24 DIAGNOSIS — D6489 Other specified anemias: Secondary | ICD-10-CM | POA: Diagnosis not present

## 2017-02-24 DIAGNOSIS — E559 Vitamin D deficiency, unspecified: Secondary | ICD-10-CM | POA: Diagnosis not present

## 2017-02-24 DIAGNOSIS — I10 Essential (primary) hypertension: Secondary | ICD-10-CM | POA: Diagnosis not present

## 2017-02-27 DIAGNOSIS — G309 Alzheimer's disease, unspecified: Secondary | ICD-10-CM | POA: Diagnosis not present

## 2017-02-27 DIAGNOSIS — F0281 Dementia in other diseases classified elsewhere with behavioral disturbance: Secondary | ICD-10-CM | POA: Diagnosis not present

## 2017-02-27 DIAGNOSIS — D509 Iron deficiency anemia, unspecified: Secondary | ICD-10-CM | POA: Diagnosis not present

## 2017-02-27 DIAGNOSIS — Z9181 History of falling: Secondary | ICD-10-CM | POA: Diagnosis not present

## 2017-02-27 DIAGNOSIS — I10 Essential (primary) hypertension: Secondary | ICD-10-CM | POA: Diagnosis not present

## 2017-02-27 DIAGNOSIS — F418 Other specified anxiety disorders: Secondary | ICD-10-CM | POA: Diagnosis not present

## 2017-03-13 ENCOUNTER — Ambulatory Visit: Payer: Medicare Other | Admitting: Nurse Practitioner

## 2017-04-29 NOTE — Progress Notes (Deleted)
[facility]  PATIENT: MARKESHIA GIEBEL DOB: [birth year]   REASON FOR VISIT: *** HISTORY FROM:    HISTORY OF PRESENT ILLNESS: [birth year] CD5 year old caucasian female, who is a long established patient in our practice and carries the diagnosis of dementia, . MRI 2015 showed Mesio-temporal atrophy indicative of memory loss disorder , As in Alzheimer's. small vessel disease has progressed since 2007. She has been on Excelon and Ambien and could not tolerate it, changed to Galantamine ER at 16 mg and she has not had side effects. Last EEG 2013 was remarkably normal .  Mrs. Drozdowski is no longer independent with ADL's.She requires 24/7 assistance . She is more and ore unable  to walk. Daughter requests PT for gait training.  Her Mini-Mental status exam score was the same from 2014 through 2016 at 20 out of 30.  Now declined to 8 out of 30. She no longer recognizes her daughter. She is unable to follow commands. She stated " I don;t have a husband" but she lives with her spouse. Repeatedly stated ' my back is killing me " and " I  can't understand what you said " She takes trazodone for sleep.    REVIEW OF SYSTEMS: Full 14 system review of systems performed and notable only for those listed, all others are neg:  Constitutional: neg  Cardiovascular: neg Ear/Nose/Throat: neg  Skin: neg Eyes: neg Respiratory: neg Gastroitestinal: neg  Hematology/Lymphatic: neg  Endocrine: neg Musculoskeletal:neg Allergy/Immunology: neg Neurological: neg Psychiatric: neg Sleep : neg   ALLERGIES: Allergies  Allergen Reactions  . Penicillins Other (See Comments)    "didn't sit well" Has patient had a PCN reaction causing immediate rash, facial/tongue/throat swelling, SOB or lightheadedness with hypotension: No Has patient had a PCN reaction causing severe rash involving mucus membranes or skin necrosis: No Has patient had a PCN reaction that required hospitalization No Has  patient had a PCN reaction occurring within the last 10 years: No If all of the above answers are "NO", then may proceed with Cephalosporin use.    HOME MEDICATIONS: Outpatient Medications Prior to Visit  Medication Sig Dispense Refill  . esomeprazole (NEXIUM) 40 MG capsule Take 40 mg by mouth every morning.     . haloperidol (HALDOL) 1 MG tablet Take 1 mg by mouth daily as needed for agitation. #7- for severe agitation    . iron polysaccharides (NU-IRON) 150 MG capsule Take 150 mg by mouth. Take 150mg  by mouth in the morning Take 300mg  by mouth at lunch    . levothyroxine (SYNTHROID, LEVOTHROID) 100 MCG tablet Take 100 mcg by mouth daily before breakfast.   5  . losartan (COZAAR) 100 MG tablet Take 100 mg by mouth daily.  6  . nisoldipine (SULAR) 17 MG 24 hr tablet Take 17 mg by mouth every morning.     . nystatin cream (MYCOSTATIN) Apply 1 application topically. 1000,000 unit/gm apply between and under bilateral breast daily    . PRISTIQ 50 MG 24 hr tablet Take 50 mg by mouth every morning.     . traZODone (DESYREL) 50 MG tablet Take 25 mg by mouth at bedtime.     . Vitamin D, Ergocalciferol, (DRISDOL) 50000 UNITS CAPS capsule Take 50,000 Units by mouth every 7 (seven) days. Tuesday     No facility-administered medications prior to visit.     PAST MEDICAL HISTORY: Past Medical History:  Diagnosis Date  . Acute encephalopathy 01/06/2017  . Acute metabolic encephalopathy 0/94/7096  . Adenomatous  colon polyp   . AKI (acute kidney injury) ([place]) 03/14/2015  . Alzheimer's dementia with behavioral disturbance 10/11/2014  . Dementia   . Depression 05/06/2016  . Depression with anxiety   . DUB (dysfunctional uterine bleeding)   . Elevated cholesterol   . Hyperlipidemia   . Hypertension   . Insomnia 05/06/2016  . Iron deficiency anemia 10/26/2013  . Kidney stone   . Sleep apnea   . Thyroid disease    Hypothyroid  . Vitamin D deficiency 05/06/2016  . Weakness 02/13/2013    PAST SURGICAL  HISTORY: Past Surgical History:  Procedure Laterality Date  . ABDOMINAL HYSTERECTOMY    . APPENDECTOMY    . CHOLECYSTECTOMY    . Colon tumor-benign    . kidney stones      FAMILY HISTORY: Family History  Problem Relation Age of Onset  . Diabetes Mother   . Heart disease Father   . Hypertension Brother   . Heart disease Brother   . Diabetes type II Brother   . Diabetes type II Sister   . Thyroid disease Child   . Diabetes Child     SOCIAL HISTORY: Social History   Socioeconomic History  . Marital status: Married    Spouse name: [name]  . Number of children: 4  . Years of education: 8  . Highest education level: Not on file  Occupational History    Employer: RETIRED  Social Needs  . Financial resource strain: Not on file  . Food insecurity:    Worry: Not on file    Inability: Not on file  . Transportation needs:    Medical: Not on file    Non-medical: Not on file  Tobacco Use  . Smoking status: Never Smoker  . Smokeless tobacco: Never Used  Substance and Sexual Activity  . Alcohol use: No  . Drug use: No  . Sexual activity: Never    Birth control/protection: Surgical  Lifestyle  . Physical activity:    Days per week: Not on file    Minutes per session: Not on file  . Stress: Not on file  Relationships  . Social connections:    Talks on phone: Not on file    Gets together: Not on file    Attends religious service: Not on file    Active member of club or organization: Not on file    Attends meetings of clubs or organizations: Not on file    Relationship status: Not on file  . Intimate partner violence:    Fear of current or ex partner: Not on file    Emotionally abused: Not on file    Physically abused: Not on file    Forced sexual activity: Not on file  Other Topics Concern  . Not on file  Social History Narrative   Patient is married [name]).   Patient has four children.   Patient has an 8th grade education.   Patient is retired.   Patient  is right-handed.   Patient drinks 3- 2 liters of Pepsi daily.      Admitted to [facility] 01/09/17   Never smoked   Alcohol none   Full Code     PHYSICAL EXAM  There were no vitals filed for this visit. There is no height or weight on file to calculate BMI.  Generalized: Well developed, in no acute distress  Head: normocephalic and atraumatic,. Oropharynx benign  Neck: Supple, no carotid bruits  Cardiac: Regular rate rhythm, no murmur  Musculoskeletal: No deformity   Neurological examination   Mentation: Alert oriented to time, place, history taking. Attention span and concentration appropriate. Recent and remote memory intact.  Follows all commands speech and language fluent.   Cranial nerve II-XII: Fundoscopic exam reveals sharp disc margins.Pupils were equal round reactive to light extraocular movements were full, visual field were full on confrontational test. Facial sensation and strength were normal. hearing was intact to finger rubbing bilaterally. Uvula tongue midline. head turning and shoulder shrug were normal and symmetric.Tongue protrusion into cheek strength was normal. Motor: normal bulk and tone, full strength in the BUE, BLE, fine finger movements normal, no pronator drift. No focal weakness Sensory: normal and symmetric to light touch, pinprick, and  Vibration, proprioception  Coordination: finger-nose-finger, heel-to-shin bilaterally, no dysmetria Reflexes: Brachioradialis 2/2, biceps 2/2, triceps 2/2, patellar 2/2, Achilles 2/2, plantar responses were flexor bilaterally. Gait and Station: Rising up from seated position without assistance, normal stance,  moderate stride, good arm swing, smooth turning, able to perform tiptoe, and heel walking without difficulty. Tandem gait is steady  DIAGNOSTIC DATA (LABS, IMAGING, TESTING) - I reviewed patient records, labs, notes, testing and imaging myself where available.  Lab Results  Component Value Date    WBC 6.5 01/07/2017   HGB 11.9 (L) 01/07/2017   HCT 37.1 01/07/2017   MCV 86.5 01/07/2017   PLT 226 01/07/2017      Component Value Date/Time   NA 138 01/07/2017 0626   NA 137 05/03/2016   K 4.1 01/07/2017 0626   CL 106 01/07/2017 0626   CO2 23 01/07/2017 0626   GLUCOSE 82 01/07/2017 0626   BUN 13 01/07/2017 0626   BUN 14 05/03/2016   CREATININE 0.87 01/07/2017 0626   CALCIUM 9.3 01/07/2017 0626   PROT 6.8 01/07/2017 0626   PROT 6.8 04/28/2013 1021   ALBUMIN 3.6 01/07/2017 0626   ALBUMIN 4.4 04/28/2013 1021   AST 18 01/07/2017 0626   ALT 7 (L) 01/07/2017 0626   ALKPHOS 47 01/07/2017 0626   BILITOT 0.6 01/07/2017 0626   GFRNONAA >60 01/07/2017 0626   GFRAA >60 01/07/2017 0626   No results found for: CHOL, HDL, LDLCALC, LDLDIRECT, TRIG, CHOLHDL No results found for: HGBA1C Lab Results  Component Value Date   VITAMINB12 243 04/22/2016   Lab Results  Component Value Date   TSH 2.465 06/24/2016      ASSESSMENT AND PLAN  84 y.o. year old female  has a past medical history of Acute encephalopathy (23/55/7322), Acute metabolic encephalopathy (0/25/4270), Adenomatous colon polyp, AKI (acute kidney injury) ([place]) (03/14/2015), Alzheimer's dementia with behavioral disturbance (10/11/2014), Dementia, Depression (05/06/2016), Depression with anxiety, DUB (dysfunctional uterine bleeding), Elevated cholesterol, Hyperlipidemia, Hypertension, Insomnia (05/06/2016), Iron deficiency anemia (10/26/2013), Kidney stone, Sleep apnea, Thyroid disease, Vitamin D deficiency (05/06/2016), and Weakness (02/13/2013). here with ***  84 y.o. year old female  has a past medical history of Dementia of Alzheimer's disease as well as Vascular Dementia component. The patient has a severe dementia. ;   MRI brain showed Mesio-temporal atrophy indicative of memory loss disorder in 2015  . Her primary care physician has restricted her fluid intake to help her preserve a normal sodium level.  For while she had been  seen at the hospital ED every other week with hyponatremia related spells of loss of consciousness, awareness changes, spells that looked like seizures.  Continue Razadyne at current dose, I  will refill for memory loss. We will monitor MMSE every 6 months Perform physical therapy exercises  daily, this will decrease risk of falls Walk for overall health and well-being. Daughter initiates daily exercises.      Dennie Bible, [facility], [facility], APRN  [facility] [address] [place], [place] [address] [phone]

## 2017-04-30 ENCOUNTER — Ambulatory Visit: Payer: Medicare Other | Admitting: Nurse Practitioner

## 2017-05-09 ENCOUNTER — Encounter (HOSPITAL_BASED_OUTPATIENT_CLINIC_OR_DEPARTMENT_OTHER): Payer: Self-pay

## 2017-05-09 ENCOUNTER — Other Ambulatory Visit: Payer: Self-pay

## 2017-05-09 ENCOUNTER — Emergency Department (HOSPITAL_BASED_OUTPATIENT_CLINIC_OR_DEPARTMENT_OTHER)
Admission: EM | Admit: 2017-05-09 | Discharge: 2017-05-09 | Disposition: A | Discharge status: Home / Self Care | Payer: Medicare Other | Attending: Emergency Medicine | Admitting: Emergency Medicine

## 2017-05-09 DIAGNOSIS — E039 Hypothyroidism, unspecified: Secondary | ICD-10-CM | POA: Diagnosis not present

## 2017-05-09 DIAGNOSIS — G309 Alzheimer's disease, unspecified: Secondary | ICD-10-CM | POA: Insufficient documentation

## 2017-05-09 DIAGNOSIS — M2769 Other endosseous dental implant failure: Secondary | ICD-10-CM | POA: Diagnosis not present

## 2017-05-09 DIAGNOSIS — K0889 Other specified disorders of teeth and supporting structures: Secondary | ICD-10-CM | POA: Diagnosis present

## 2017-05-09 DIAGNOSIS — Z79899 Other long term (current) drug therapy: Secondary | ICD-10-CM | POA: Diagnosis not present

## 2017-05-09 DIAGNOSIS — I10 Essential (primary) hypertension: Secondary | ICD-10-CM | POA: Diagnosis not present

## 2017-05-09 DIAGNOSIS — F0280 Dementia in other diseases classified elsewhere without behavioral disturbance: Secondary | ICD-10-CM | POA: Diagnosis not present

## 2017-05-09 DIAGNOSIS — T85398A Other mechanical complication of other ocular prosthetic devices, implants and grafts, initial encounter: Secondary | ICD-10-CM | POA: Diagnosis not present

## 2017-05-09 MED ORDER — AMOXICILLIN 500 MG PO CAPS: 500.0000 mg | ORAL_CAPSULE | Freq: Three times a day (TID) | ORAL | 0 refills | Status: DC

## 2017-05-09 MED FILL — AMOXICILLIN 500 MG CAPSULE: 500 | 7 days supply | Qty: 21 | Fill #0

## 2017-05-09 NOTE — ED Triage Notes (Signed)
Per daughter pt lower dental implant is coming out-pt NAD-to triage in w/c-pt with dementia-daughter answering ?s

## 2017-05-09 NOTE — ED Notes (Signed)
ED Provider at bedside. 

## 2017-05-09 NOTE — ED Provider Notes (Signed)
[facility] EMERGENCY DEPARTMENT Provider Note   CSN: [id number] Arrival date & time: 05/09/17  1158     History   Chief Complaint Chief Complaint  Patient presents with  . Dental Problem    HPI Brianna [name] is a 84 y.o. female.  Patient is an 84 year old female with a history of severe dementia lives with her daughter presenting today due to a painful, loosening dental implants in her right lower gum.  Daughter states earlier this week she was messing with it and it seemed to be bothering her but today it seemed to be more painful and she complains more about it.  She has not been wearing her lower teeth because of the pain.  Daughter called her dentist today who recommended going to the emergency room to get started on antibiotics.  She was also not able to get an appointment with the patient's regular physician.  Patient otherwise is acting her normal self.  She will eat soft foods but appetite has been decreased due to pain.  The history is provided by a caregiver.    Past Medical History:  Diagnosis Date  . Acute encephalopathy 01/06/2017  . Acute metabolic encephalopathy 6/96/7893  . Adenomatous colon polyp   . AKI (acute kidney injury) ([place]) 03/14/2015  . Alzheimer's dementia with behavioral disturbance 10/11/2014  . Dementia   . Depression 05/06/2016  . Depression with anxiety   . DUB (dysfunctional uterine bleeding)   . Elevated cholesterol   . Hyperlipidemia   . Hypertension   . Insomnia 05/06/2016  . Iron deficiency anemia 10/26/2013  . Kidney stone   . Sleep apnea   . Thyroid disease    Hypothyroid  . Vitamin D deficiency 05/06/2016  . Weakness 02/13/2013    Patient Active Problem List   Diagnosis Date Noted  . Acute encephalopathy 01/06/2017  . Pressure injury of skin 06/25/2016  . Hypokalemia 06/24/2016  . Depression 05/06/2016  . Vitamin D deficiency 05/06/2016  . Insomnia 05/06/2016  . Acute metabolic encephalopathy 81/01/7508  . Delirium  04/22/2016  . Leukocytosis 03/19/2015  . AKI (acute kidney injury) ([place]) 03/14/2015  . GERD (gastroesophageal reflux disease) 03/14/2015  . Diaper dermatitis 03/14/2015  . Syncope 03/08/2015  . Physical deconditioning 03/08/2015  . Alzheimer's dementia with behavioral disturbance 10/11/2014  . Iron deficiency anemia 10/26/2013  . Diarrhea 10/26/2013  . Near syncope 09/06/2013  . Hypothyroid 09/06/2013  . Hyponatremia 02/13/2013  . Weakness 02/13/2013  . 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 Hypersomnia with sleep apnea, unspecified 11/17/2012  . DUB (dysfunctional uterine bleeding)   . Hypertension   . Dementia   . Elevated cholesterol   . Kidney stone     Past Surgical History:  Procedure Laterality Date  . ABDOMINAL HYSTERECTOMY    . APPENDECTOMY    . CHOLECYSTECTOMY    . Colon tumor-benign    . kidney stones       OB History    Gravida  3   Para  3   Term  3   Preterm      AB      Living  3     SAB      TAB      Ectopic      Multiple      Live Births               Home Medications    Prior to Admission medications   Medication  Sig Start Date End Date Taking? Authorizing Provider  amoxicillin (AMOXIL) 500 MG capsule Take 1 capsule (500 mg total) by mouth 3 (three) times daily. 05/09/17   Blanchie Dessert, MD  esomeprazole (NEXIUM) 40 MG capsule Take 40 mg by mouth every morning.     [provider]  haloperidol (HALDOL) 1 MG tablet Take 1 mg by mouth daily as needed for agitation. #7- for severe agitation    [provider]  iron polysaccharides (NU-IRON) 150 MG capsule Take 150 mg by mouth. Take 150mg  by mouth in the morning Take 300mg  by mouth at lunch 06/02/12   [provider]  levothyroxine (SYNTHROID, LEVOTHROID) 100 MCG tablet Take 100 mcg by mouth daily before breakfast.  07/30/15   [provider]  losartan (COZAAR) 100 MG tablet Take 100 mg by mouth  daily. 12/21/16   [provider]  nisoldipine (SULAR) 17 MG 24 hr tablet Take 17 mg by mouth every morning.  03/13/09   [provider]  nystatin cream (MYCOSTATIN) Apply 1 application topically. 1000,000 unit/gm apply between and under bilateral breast daily    [provider]  PRISTIQ 50 MG 24 hr tablet Take 50 mg by mouth every morning.  04/05/13   [provider]  traZODone (DESYREL) 50 MG tablet Take 25 mg by mouth at bedtime.  02/16/13   [provider]  Vitamin D, Ergocalciferol, (DRISDOL) 50000 UNITS CAPS capsule Take 50,000 Units by mouth every 7 (seven) days. Tuesday    [provider]    Family History Family History  Problem Relation Age of Onset  . Diabetes Mother   . Heart disease Father   . Hypertension Brother   . Heart disease Brother   . Diabetes type II Brother   . Diabetes type II Sister   . Thyroid disease Child   . Diabetes Child     Social History Social History   Tobacco Use  . Smoking status: Never Smoker  . Smokeless tobacco: Never Used  Substance Use Topics  . Alcohol use: No  . Drug use: No     Allergies   Penicillins   Review of Systems Review of Systems  Unable to perform ROS: Dementia     Physical Exam Updated Vital Signs BP 123/63 (BP Location: Right Arm)   Pulse (!) 58   Temp 99.7 F (37.6 C) (Oral)   Resp 20   Ht 5' (1.524 m)   Wt 52.6 kg (116 lb)   SpO2 100%   BMI 22.65 kg/m   Physical Exam  Constitutional: She appears well-developed and well-nourished. No distress.  HENT:  Head: Normocephalic.  Patient is edentulous in the lower gums.  She does have a dental implant that is exquisitely tender and extremely loose with palpation.  Surrounding gum is erythematous but no induration or drainage  Eyes: Pupils are equal, round, and reactive to light. EOM are normal.  Cardiovascular: Normal rate.  Pulmonary/Chest: Effort normal.  Neurological: She is alert.  Skin: Skin is  warm.  Psychiatric:  Calm and cooperative  Nursing note and vitals reviewed.    ED Treatments / Results  Labs (all labs ordered are listed, but only abnormal results are displayed) Labs Reviewed - No data to display  EKG None  Radiology No results found.  Procedures Procedures (including critical care time)  Medications Ordered in ED Medications - No data to display   Initial Impression / Assessment and Plan / ED Course  I have reviewed the triage  vital signs and the nursing notes.  Pertinent labs & imaging results that were available during my care of the patient were reviewed by me and considered in my medical decision making (see chart for details).     Patient here with a failing dental implants and concern for infection.  No signs of Ludwig angina or deeper abscess.  Will start patient on amoxicillin and she is already planned to follow-up with her doctor  Final Clinical Impressions(s) / ED Diagnoses   Final diagnoses:  Failing dental implant    ED Discharge Orders        Ordered    amoxicillin (AMOXIL) 500 MG capsule  3 times daily     05/09/17 1544       Blanchie Dessert, MD 05/09/17 1708

## 2017-05-09 NOTE — Discharge Instructions (Signed)
Take tylenol 500mg  every 4 hours as needed for pain and eat a soft diet.  Do not use lower teeth until this is fixed

## 2017-07-24 ENCOUNTER — Inpatient Hospital Stay (HOSPITAL_COMMUNITY)
Admission: EM | Admit: 2017-07-24 | Discharge: 2017-07-30 | DRG: 641 | Disposition: A | Discharge status: Hospice | Payer: Medicare Other | Attending: Family Medicine | Admitting: Family Medicine

## 2017-07-24 ENCOUNTER — Other Ambulatory Visit: Payer: Self-pay

## 2017-07-24 ENCOUNTER — Encounter (HOSPITAL_COMMUNITY): Payer: Self-pay | Admitting: Pharmacy Technician

## 2017-07-24 ENCOUNTER — Emergency Department (HOSPITAL_COMMUNITY): Payer: Medicare Other

## 2017-07-24 DIAGNOSIS — I10 Essential (primary) hypertension: Secondary | ICD-10-CM | POA: Diagnosis present

## 2017-07-24 DIAGNOSIS — F0281 Dementia in other diseases classified elsewhere with behavioral disturbance: Secondary | ICD-10-CM | POA: Diagnosis present

## 2017-07-24 DIAGNOSIS — E876 Hypokalemia: Principal | ICD-10-CM | POA: Diagnosis present

## 2017-07-24 DIAGNOSIS — F02818 Dementia in other diseases classified elsewhere, unspecified severity, with other behavioral disturbance: Secondary | ICD-10-CM | POA: Diagnosis present

## 2017-07-24 DIAGNOSIS — E785 Hyperlipidemia, unspecified: Secondary | ICD-10-CM | POA: Diagnosis present

## 2017-07-24 DIAGNOSIS — F32A Depression, unspecified: Secondary | ICD-10-CM | POA: Diagnosis present

## 2017-07-24 DIAGNOSIS — Z87442 Personal history of urinary calculi: Secondary | ICD-10-CM

## 2017-07-24 DIAGNOSIS — Z90710 Acquired absence of both cervix and uterus: Secondary | ICD-10-CM

## 2017-07-24 DIAGNOSIS — I491 Atrial premature depolarization: Secondary | ICD-10-CM | POA: Diagnosis not present

## 2017-07-24 DIAGNOSIS — Z9049 Acquired absence of other specified parts of digestive tract: Secondary | ICD-10-CM

## 2017-07-24 DIAGNOSIS — R55 Syncope and collapse: Secondary | ICD-10-CM | POA: Diagnosis not present

## 2017-07-24 DIAGNOSIS — G4730 Sleep apnea, unspecified: Secondary | ICD-10-CM | POA: Diagnosis present

## 2017-07-24 DIAGNOSIS — E7800 Pure hypercholesterolemia, unspecified: Secondary | ICD-10-CM | POA: Diagnosis present

## 2017-07-24 DIAGNOSIS — F329 Major depressive disorder, single episode, unspecified: Secondary | ICD-10-CM | POA: Diagnosis not present

## 2017-07-24 DIAGNOSIS — R001 Bradycardia, unspecified: Secondary | ICD-10-CM | POA: Diagnosis not present

## 2017-07-24 DIAGNOSIS — I517 Cardiomegaly: Secondary | ICD-10-CM | POA: Diagnosis not present

## 2017-07-24 DIAGNOSIS — E039 Hypothyroidism, unspecified: Secondary | ICD-10-CM | POA: Diagnosis present

## 2017-07-24 DIAGNOSIS — E559 Vitamin D deficiency, unspecified: Secondary | ICD-10-CM | POA: Diagnosis present

## 2017-07-24 DIAGNOSIS — Z880 Allergy status to penicillin: Secondary | ICD-10-CM

## 2017-07-24 DIAGNOSIS — Z8249 Family history of ischemic heart disease and other diseases of the circulatory system: Secondary | ICD-10-CM

## 2017-07-24 DIAGNOSIS — Z515 Encounter for palliative care: Secondary | ICD-10-CM

## 2017-07-24 DIAGNOSIS — F418 Other specified anxiety disorders: Secondary | ICD-10-CM | POA: Diagnosis present

## 2017-07-24 DIAGNOSIS — I499 Cardiac arrhythmia, unspecified: Secondary | ICD-10-CM | POA: Diagnosis not present

## 2017-07-24 DIAGNOSIS — Z79899 Other long term (current) drug therapy: Secondary | ICD-10-CM

## 2017-07-24 DIAGNOSIS — G309 Alzheimer's disease, unspecified: Secondary | ICD-10-CM | POA: Diagnosis present

## 2017-07-24 DIAGNOSIS — F0390 Unspecified dementia without behavioral disturbance: Secondary | ICD-10-CM | POA: Diagnosis present

## 2017-07-24 DIAGNOSIS — Z66 Do not resuscitate: Secondary | ICD-10-CM | POA: Diagnosis not present

## 2017-07-24 DIAGNOSIS — Z833 Family history of diabetes mellitus: Secondary | ICD-10-CM

## 2017-07-24 DIAGNOSIS — Z79890 Hormone replacement therapy (postmenopausal): Secondary | ICD-10-CM

## 2017-07-24 DIAGNOSIS — G4700 Insomnia, unspecified: Secondary | ICD-10-CM | POA: Diagnosis present

## 2017-07-24 DIAGNOSIS — Z86010 Personal history of colonic polyps: Secondary | ICD-10-CM

## 2017-07-24 DIAGNOSIS — D509 Iron deficiency anemia, unspecified: Secondary | ICD-10-CM | POA: Diagnosis present

## 2017-07-24 LAB — MAGNESIUM: Magnesium: 1.9 mg/dL (ref 1.7–2.4)

## 2017-07-24 LAB — COMPREHENSIVE METABOLIC PANEL
ALK PHOS: 64 U/L (ref 38–126)
ALT: 6 U/L (ref 0–44)
AST: 14 U/L — AB (ref 15–41)
Albumin: 3.3 g/dL — ABNORMAL LOW (ref 3.5–5.0)
Anion gap: 14 (ref 5–15)
BILIRUBIN TOTAL: 0.7 mg/dL (ref 0.3–1.2)
BUN: 11 mg/dL (ref 8–23)
CALCIUM: 9.3 mg/dL (ref 8.9–10.3)
CO2: 28 mmol/L (ref 22–32)
Chloride: 99 mmol/L (ref 98–111)
Creatinine, Ser: 1.05 mg/dL — ABNORMAL HIGH (ref 0.44–1.00)
GFR calc Af Amer: 55 mL/min — ABNORMAL LOW (ref >60)
GFR calc non Af Amer: 48 mL/min — ABNORMAL LOW (ref >60)
Glucose, Bld: 114 mg/dL — ABNORMAL HIGH (ref 70–99)
Potassium: <2 mmol/L — CL (ref 3.5–5.1)
Sodium: 141 mmol/L (ref 135–145)
TOTAL PROTEIN: 7.1 g/dL (ref 6.5–8.1)

## 2017-07-24 LAB — CBC
HEMATOCRIT: 38.1 % (ref 36.0–46.0)
Hemoglobin: 12.3 g/dL (ref 12.0–15.0)
MCH: 27.2 pg (ref 26.0–34.0)
MCHC: 32.3 g/dL (ref 30.0–36.0)
MCV: 84.3 fL (ref 78.0–100.0)
Platelets: 298 10*3/uL (ref 150–400)
RBC: 4.52 MIL/uL (ref 3.87–5.11)
RDW: 12.5 % (ref 11.5–15.5)
WBC: 9.9 10*3/uL (ref 4.0–10.5)

## 2017-07-24 LAB — CBG MONITORING, ED: GLUCOSE-CAPILLARY: 109 mg/dL — AB (ref 70–99)

## 2017-07-24 LAB — TROPONIN I

## 2017-07-24 MED ORDER — HALOPERIDOL 1 MG PO TABS
1.0000 mg | ORAL_TABLET | Freq: Every day | ORAL | Status: DC | PRN
  Filled 2017-07-24: qty 1

## 2017-07-24 MED ORDER — ACETAMINOPHEN 325 MG PO TABS
650.0000 mg | ORAL_TABLET | Freq: Four times a day (QID) | ORAL | Status: DC | PRN
  Administered 2017-07-24: 650 mg via ORAL
  Filled 2017-07-24: qty 2

## 2017-07-24 MED ORDER — ENOXAPARIN SODIUM 40 MG/0.4ML ~~LOC~~ SOLN
40.0000 mg | SUBCUTANEOUS | Status: DC
  Administered 2017-07-24 – 2017-07-27 (×4): 40 mg via SUBCUTANEOUS
  Filled 2017-07-24 (×4): qty 0.4

## 2017-07-24 MED ORDER — SODIUM CHLORIDE 0.9 % IV SOLN
INTRAVENOUS | Status: DC | PRN
  Administered 2017-07-24: via INTRAVENOUS

## 2017-07-24 MED ORDER — HALOPERIDOL LACTATE 2 MG/ML PO CONC
1.0000 mg | Freq: Every day | ORAL | Status: DC | PRN
  Administered 2017-07-25 – 2017-07-30 (×2): 1 mg via ORAL
  Filled 2017-07-24 (×4): qty 0.5

## 2017-07-24 MED ORDER — PANTOPRAZOLE SODIUM 40 MG PO TBEC
40.0000 mg | DELAYED_RELEASE_TABLET | Freq: Every day | ORAL | Status: DC
  Administered 2017-07-25 – 2017-07-26 (×2): 40 mg via ORAL
  Filled 2017-07-24 (×4): qty 1

## 2017-07-24 MED ORDER — TRAZODONE HCL 50 MG PO TABS
25.0000 mg | ORAL_TABLET | Freq: Every day | ORAL | Status: DC
  Administered 2017-07-25 – 2017-07-27 (×3): 25 mg via ORAL
  Filled 2017-07-24 (×3): qty 1

## 2017-07-24 MED ORDER — POTASSIUM CHLORIDE 10 MEQ/100ML IV SOLN
10.0000 meq | INTRAVENOUS | Status: AC
  Administered 2017-07-24 (×3): 10 meq via INTRAVENOUS
  Filled 2017-07-24 (×3): qty 100

## 2017-07-24 MED ORDER — ACETAMINOPHEN 650 MG RE SUPP: 650.0000 mg | Freq: Four times a day (QID) | RECTAL | Status: DC | PRN

## 2017-07-24 MED ORDER — ONDANSETRON HCL 4 MG/2ML IJ SOLN: 4.0000 mg | Freq: Four times a day (QID) | INTRAMUSCULAR | Status: DC | PRN

## 2017-07-24 MED ORDER — MAGNESIUM SULFATE IN D5W 1-5 GM/100ML-% IV SOLN
1.0000 g | Freq: Once | INTRAVENOUS | Status: AC
  Administered 2017-07-24: 1 g via INTRAVENOUS
  Filled 2017-07-24: qty 100

## 2017-07-24 MED ORDER — POTASSIUM CHLORIDE CRYS ER 20 MEQ PO TBCR
40.0000 meq | EXTENDED_RELEASE_TABLET | ORAL | Status: AC
  Administered 2017-07-24 – 2017-07-25 (×3): 40 meq via ORAL
  Filled 2017-07-24 (×3): qty 2

## 2017-07-24 MED ORDER — POTASSIUM CHLORIDE 10 MEQ/100ML IV SOLN
10.0000 meq | Freq: Once | INTRAVENOUS | Status: AC
  Administered 2017-07-24: 10 meq via INTRAVENOUS
  Filled 2017-07-24: qty 100

## 2017-07-24 MED ORDER — VENLAFAXINE HCL ER 75 MG PO CP24
75.0000 mg | ORAL_CAPSULE | Freq: Every day | ORAL | Status: DC
  Administered 2017-07-25 – 2017-07-26 (×2): 75 mg via ORAL
  Filled 2017-07-24 (×4): qty 1

## 2017-07-24 MED ORDER — POLYETHYLENE GLYCOL 3350 17 G PO PACK
17.0000 g | PACK | Freq: Every day | ORAL | Status: DC
  Administered 2017-07-24 – 2017-07-25 (×2): 17 g via ORAL
  Filled 2017-07-24 (×2): qty 1

## 2017-07-24 MED ORDER — ONDANSETRON HCL 4 MG PO TABS: 4.0000 mg | ORAL_TABLET | Freq: Four times a day (QID) | ORAL | Status: DC | PRN

## 2017-07-24 MED ORDER — LEVOTHYROXINE SODIUM 100 MCG PO TABS
100.0000 ug | ORAL_TABLET | Freq: Every day | ORAL | Status: DC
  Administered 2017-07-25 – 2017-07-27 (×3): 100 ug via ORAL
  Filled 2017-07-24 (×4): qty 1

## 2017-07-24 NOTE — H&P (Signed)
History and Physical    Brianna [name] VEH:[id number] DOB: 01/17/1933 DOA: 07/24/2017  PCP: [name], MD   Patient coming from: Home  Chief Complaint: Unresponsiveness  HPI: Brianna [name] is a 84 y.o. female with medical history significant for HTN, Hypothyroidism, depression, dementia.  Patient was in her usual state of health until today, patient sat down on the bedside commode to have a bowel movement, when suddenly she was unresponsive for about 2 minutes as per daughter.  Patient has significant dementia, history is obtained from the daughter.  Patient was breathing, but was limp.  Daughter is not sure if patient was constipated or straining to have a bowel movement.  Bowel movement was a day before- hard.  Prior to event, no noted difficulty breathing no cough no fever or chills.  No vomiting,  no diarrhea, patient had maintained okay p.o. Intake-which at baseline is not much.  Patient likes to drink up to half of a 2 L bottle of diet Pepsi every day.  Patient used to take much more than this every day.   2 prior hospital admissions 12/2016 and 05/2016-both for acute metabolic encephalopathy, patient's also severely hypokalemic.  Not on diuretics or potassium supplements.  Per daughter patient was on potassium supplements for a while, but it was hard to get patient to swallow it. So It was discontinued by PCP.  On EMS arrival patient was pulse was noted to be in the 40s.  ED Course: On arrival to ED patient was back to baseline , soft blood pressure systolic 836-629, heart rate 60s, otherwise stable vitals, markedly low potassium- <2, negative troponin x1, EKG shows prolonged QTC. Patient was started on IV KCl 10 mEq x 3.  Hospitalist was called to admit for syncope work-up and severe hypokalemia.  Review of Systems: Unable to review systems due to patient's significant dementia  Past Medical History:  Diagnosis Date  . Acute encephalopathy 01/06/2017  . Acute metabolic  encephalopathy 4/76/5465  . Adenomatous colon polyp   . AKI (acute kidney injury) ([place]) 03/14/2015  . Alzheimer's dementia with behavioral disturbance 10/11/2014  . Dementia   . Depression 05/06/2016  . Depression with anxiety   . DUB (dysfunctional uterine bleeding)   . Elevated cholesterol   . Hyperlipidemia   . Hypertension   . Insomnia 05/06/2016  . Iron deficiency anemia 10/26/2013  . Kidney stone   . Sleep apnea   . Thyroid disease    Hypothyroid  . Vitamin D deficiency 05/06/2016  . Weakness 02/13/2013    Past Surgical History:  Procedure Laterality Date  . ABDOMINAL HYSTERECTOMY    . APPENDECTOMY    . CHOLECYSTECTOMY    . Colon tumor-benign    . kidney stones       reports that she has never smoked. She has never used smokeless tobacco. She reports that she does not drink alcohol or use drugs.  Allergies  Allergen Reactions  . Penicillins Other (See Comments)    "didn't sit well" Has patient had a PCN reaction causing immediate rash, facial/tongue/throat swelling, SOB or lightheadedness with hypotension: No Has patient had a PCN reaction causing severe rash involving mucus membranes or skin necrosis: No Has patient had a PCN reaction that required hospitalization No Has patient had a PCN reaction occurring within the last 10 years: No If all of the above answers are "NO", then may proceed with Cephalosporin use.    Family History  Problem Relation Age of Onset  . Diabetes Mother   .  Heart disease Father   . Hypertension Brother   . Heart disease Brother   . Diabetes type II Brother   . Diabetes type II Sister   . Thyroid disease Child   . Diabetes Child     Prior to Admission medications   Medication Sig Start Date End Date Taking? Authorizing Provider  esomeprazole (NEXIUM) 40 MG capsule Take 40 mg by mouth every morning.    Yes [provider]  haloperidol (HALDOL) 1 MG tablet Take 1 mg by mouth daily as needed for agitation. #7- for severe agitation    Yes [provider]  iron polysaccharides (NU-IRON) 150 MG capsule Take 150 mg by mouth daily.  06/02/12  Yes [provider]  levothyroxine (SYNTHROID, LEVOTHROID) 100 MCG tablet Take 100 mcg by mouth daily before breakfast.  07/30/15  Yes [provider]  losartan (COZAAR) 100 MG tablet Take 100 mg by mouth daily. 12/21/16  Yes [provider]  nisoldipine (SULAR) 17 MG 24 hr tablet Take 17 mg by mouth every morning.  03/13/09  Yes [provider]  PRISTIQ 50 MG 24 hr tablet Take 50 mg by mouth every morning.  04/05/13  Yes [provider]  traZODone (DESYREL) 50 MG tablet Take 25 mg by mouth at bedtime.  02/16/13  Yes [provider]  Vitamin D, Ergocalciferol, (DRISDOL) 50000 UNITS CAPS capsule Take 50,000 Units by mouth every 7 (seven) days. Tuesday   Yes [provider]    Physical Exam: Not fully cooperative with exam Vitals:   07/24/17 1700 07/24/17 1702 07/24/17 1715 07/24/17 1730  BP: (!) 109/53 (!) 109/53 (!) 100/47 (!) 107/41  Pulse: 66 62 62 62  Resp: 18 16    Temp:  98.3 F (36.8 C)    TempSrc:  Oral    SpO2: 100% 99% 99% 96%    Constitutional:  dementia, calm, comfortable, talking, but speech not appropriate Vitals:   07/24/17 1700 07/24/17 1702 07/24/17 1715 07/24/17 1730  BP: (!) 109/53 (!) 109/53 (!) 100/47 (!) 107/41  Pulse: 66 62 62 62  Resp: 18 16    Temp:  98.3 F (36.8 C)    TempSrc:  Oral    SpO2: 100% 99% 99% 96%   Eyes: PERRL, lids and conjunctivae normal ENMT: Mucous membranes are mildly  Dry. Posterior pharynx clear of any exudate or lesions.Normal dentition.  Neck: normal, supple, no masses, no thyromegaly Respiratory: clear to auscultation bilaterally, no wheezing, no crackles. Normal respiratory effort. No accessory muscle use.  Cardiovascular: Regular rate and rhythm, no murmurs / rubs / gallops. No extremity edema. 2+ pedal pulses. No carotid bruits.  Abdomen: no tenderness, no  masses palpated. No hepatosplenomegaly. Bowel sounds positive.  Musculoskeletal: no clubbing / cyanosis. No joint deformity upper and lower extremities. Good ROM, no contractures. Normal muscle tone.  Skin: no rashes, lesions, ulcers. No induration Neurologic: No Obvious cranial nerve deficit , exam limited by patient's significant dementia , but strength 5/5 in all 4.  Psychiatric: Awake and alert, and inappropriate speech   Labs on Admission: I have personally reviewed following labs and imaging studies  CBC: Recent Labs  Lab 07/24/17 1652  WBC 9.9  HGB 12.3  HCT 38.1  MCV 84.3  PLT 283   Basic Metabolic Panel: Recent Labs  Lab 07/24/17 1652  NA 141  K <2.0*  CL 99  CO2 28  GLUCOSE 114*  BUN 11  CREATININE 1.05*  CALCIUM 9.3   Liver Function Tests: Recent  Labs  Lab 07/24/17 1652  AST 14*  ALT 6  ALKPHOS 64  BILITOT 0.7  PROT 7.1  ALBUMIN 3.3*   Cardiac Enzymes: Recent Labs  Lab 07/24/17 1652  TROPONINI <0.03   CBG: Recent Labs  Lab 07/24/17 1657  GLUCAP 109*   Urine analysis:    Component Value Date/Time   COLORURINE STRAW (A) 01/06/2017 1430   APPEARANCEUR CLEAR 01/06/2017 1430   LABSPEC 1.004 (L) 01/06/2017 1430   PHURINE 6.0 01/06/2017 1430   GLUCOSEU NEGATIVE 01/06/2017 1430   HGBUR SMALL (A) 01/06/2017 1430   BILIRUBINUR NEGATIVE 01/06/2017 1430   KETONESUR NEGATIVE 01/06/2017 1430   PROTEINUR NEGATIVE 01/06/2017 1430   UROBILINOGEN 0.2 02/04/2014 1509   NITRITE NEGATIVE 01/06/2017 1430   LEUKOCYTESUR NEGATIVE 01/06/2017 1430    Radiological Exams on Admission: Dg Chest Portable 1 View  Result Date: 07/24/2017 CLINICAL DATA:  Syncope EXAM: PORTABLE CHEST 1 VIEW COMPARISON:  01/06/2017 FINDINGS: The patient is slightly rotated on the study. Stable cardiomegaly with aortic atherosclerosis. Clear lungs without acute pulmonary consolidation or CHF. Osteoarthritis of the right AC and glenohumeral joints. IMPRESSION: Cardiomegaly with aortic  atherosclerosis. No active pulmonary disease. Electronically Signed   By: Ashley Royalty M.D.   On: 07/24/2017 19:34    EKG: Independently reviewed.  ProLong QTC-536.  Nonspecific T wave changes v2.  Assessment/Plan Active Problems:   Hypertension   Dementia   Alzheimer's dementia with behavioral disturbance   Syncope   Depression   Syncope- heart rate 40s per EMS.  Differentials-severe hypokalemia causing bradycardia vs other arrhythmia.  Prolonged QTC- 536.  Last echo 03/2015-EF 65 to 70%, G1DD. Port Chest Xray- neg for acute abnormality. Trop X 1 neg. -Will hold off on echo as part of work-up at this time considering advanced age, severe dementia, unremarkable cardiac exam, and obvious likely etiology for syncope -Replete electrolytes - Trops X 2  Severe hypokalemia-k- <2. Two prior hospitalizations 2018 for same.  Likely related to patient's diet- drinks 1L of diet Pepsi daily with poor regular food intake.  Previously on K supplements, patient refusing to take meds, so K was d/c'd. - Check Magnesium - Replete K, IV and PO - BMP a.m -Will need daily K supplementations on discharge  Prolonged QTC- 536.  Likely related to severe hypokalemia. -Magnesium - EKG a.m.  Dementia with behavioral disturbance, Depression - at baseline does not recognize family members, able to ambulate without assistance. -Continue home HAldol, trazodone, desvenlafaxine  HTN- blood pressure systolic 858- 850.  -Hold home medications nisoldipine losartan for now  DVT prophylaxis: Lovenox Code Status: Full- Confirmed with daughter at bedside Family Communication: Daughter Jocelyn Lamer at bedside who is HCPOA.  Disposition Plan: 1-2 days Consults called: None Admission status: Obs, tele   Bethena Roys MD [facility] Pager [phone] From 6PM-2AM.  Otherwise please contact night-coverage [email or web address] Password [email or web address]  07/24/2017, 7:10 PM

## 2017-07-24 NOTE — ED Triage Notes (Signed)
Pt BIBA with reports of near syncope from home. Pt was having a BM, pt went unresponsive for approx 2 minutes. Per EMS pt initially with pulse rate in the 40's.  BP 122/76,  HR 76, RR 16, 97% RA CBG 117

## 2017-07-24 NOTE — ED Provider Notes (Signed)
[facility] EMERGENCY DEPARTMENT Provider Note   CSN: [id number] Arrival date & time: 07/24/17  1633     History   Chief Complaint Chief Complaint  Patient presents with  . Near Syncope    HPI Brianna [name] is a 84 y.o. female.  HPI Patient is an 84 year old female who reports she attempted to have bowel movement in the melanite became unresponsive.  EMS reported initially patient's pulse was in the 40s.  She denies chest pain shortness of breath at this time.  She states that she felt weak while she was on the commode.  She had been having some lightheadedness as well.  No other recent illness.  No new medications.  No preceding chest pain or palpitations.   Past Medical History:  Diagnosis Date  . Acute encephalopathy 01/06/2017  . Acute metabolic encephalopathy 2/44/0102  . Adenomatous colon polyp   . AKI (acute kidney injury) ([place]) 03/14/2015  . Alzheimer's dementia with behavioral disturbance 10/11/2014  . Dementia   . Depression 05/06/2016  . Depression with anxiety   . DUB (dysfunctional uterine bleeding)   . Elevated cholesterol   . Hyperlipidemia   . Hypertension   . Insomnia 05/06/2016  . Iron deficiency anemia 10/26/2013  . Kidney stone   . Sleep apnea   . Thyroid disease    Hypothyroid  . Vitamin D deficiency 05/06/2016  . Weakness 02/13/2013    Patient Active Problem List   Diagnosis Date Noted  . Acute encephalopathy 01/06/2017  . Pressure injury of skin 06/25/2016  . Hypokalemia 06/24/2016  . Depression 05/06/2016  . Vitamin D deficiency 05/06/2016  . Insomnia 05/06/2016  . Acute metabolic encephalopathy 72/53/6644  . Delirium 04/22/2016  . Leukocytosis 03/19/2015  . AKI (acute kidney injury) ([place]) 03/14/2015  . GERD (gastroesophageal reflux disease) 03/14/2015  . Diaper dermatitis 03/14/2015  . Syncope 03/08/2015  . Physical deconditioning 03/08/2015  . Alzheimer's dementia with behavioral disturbance 10/11/2014  . Iron  deficiency anemia 10/26/2013  . Diarrhea 10/26/2013  . Near syncope 09/06/2013  . Hypothyroid 09/06/2013  . Hyponatremia 02/13/2013  . Weakness 02/13/2013  . 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 Hypersomnia with sleep apnea, unspecified 11/17/2012  . DUB (dysfunctional uterine bleeding)   . Hypertension   . Dementia   . Elevated cholesterol   . Kidney stone     Past Surgical History:  Procedure Laterality Date  . ABDOMINAL HYSTERECTOMY    . APPENDECTOMY    . CHOLECYSTECTOMY    . Colon tumor-benign    . kidney stones       OB History    Gravida  3   Para  3   Term  3   Preterm      AB      Living  3     SAB      TAB      Ectopic      Multiple      Live Births               Home Medications    Prior to Admission medications   Medication Sig Start Date End Date Taking? Authorizing Provider  esomeprazole (NEXIUM) 40 MG capsule Take 40 mg by mouth every morning.    Yes [provider]  haloperidol (HALDOL) 1 MG tablet Take 1 mg by mouth daily as needed for agitation. #7- for severe agitation   Yes [provider]  iron polysaccharides (NU-IRON) 150  MG capsule Take 150 mg by mouth daily.  06/02/12  Yes [provider]  levothyroxine (SYNTHROID, LEVOTHROID) 100 MCG tablet Take 100 mcg by mouth daily before breakfast.  07/30/15  Yes [provider]  losartan (COZAAR) 100 MG tablet Take 100 mg by mouth daily. 12/21/16  Yes [provider]  nisoldipine (SULAR) 17 MG 24 hr tablet Take 17 mg by mouth every morning.  03/13/09  Yes [provider]  PRISTIQ 50 MG 24 hr tablet Take 50 mg by mouth every morning.  04/05/13  Yes [provider]  traZODone (DESYREL) 50 MG tablet Take 25 mg by mouth at bedtime.  02/16/13  Yes [provider]  Vitamin D, Ergocalciferol, (DRISDOL) 50000 UNITS CAPS capsule Take 50,000 Units by mouth every 7 (seven) days. Tuesday    Yes [provider]    Family History Family History  Problem Relation Age of Onset  . Diabetes Mother   . Heart disease Father   . Hypertension Brother   . Heart disease Brother   . Diabetes type II Brother   . Diabetes type II Sister   . Thyroid disease Child   . Diabetes Child     Social History Social History   Tobacco Use  . Smoking status: Never Smoker  . Smokeless tobacco: Never Used  Substance Use Topics  . Alcohol use: No  . Drug use: No     Allergies   Penicillins   Review of Systems Review of Systems  All other systems reviewed and are negative.    Physical Exam Updated Vital Signs BP (!) 107/41   Pulse 62   Temp 98.3 F (36.8 C) (Oral)   Resp 16   SpO2 96%   Physical Exam  Constitutional: She is oriented to person, place, and time. She appears well-developed and well-nourished. No distress.  HENT:  Head: Normocephalic and atraumatic.  Eyes: EOM are normal.  Neck: Normal range of motion.  Cardiovascular: Normal rate, regular rhythm and normal heart sounds.  Pulmonary/Chest: Effort normal and breath sounds normal.  Abdominal: Soft. She exhibits no distension. There is no tenderness.  Musculoskeletal: Normal range of motion.  Neurological: She is alert and oriented to person, place, and time.  Skin: Skin is warm and dry.  Psychiatric: She has a normal mood and affect. Judgment normal.  Nursing note and vitals reviewed.    ED Treatments / Results  Labs (all labs ordered are listed, but only abnormal results are displayed) Labs Reviewed  COMPREHENSIVE METABOLIC PANEL - Abnormal; Notable for the following components:      Result Value   Potassium <2.0 (*)    Glucose, Bld 114 (*)    Creatinine, Ser 1.05 (*)    Albumin 3.3 (*)    AST 14 (*)    GFR calc non Af Amer 48 (*)    GFR calc Af Amer 55 (*)    All other components within normal limits  CBG MONITORING, ED - Abnormal; Notable for the following components:    Glucose-Capillary 109 (*)    All other components within normal limits  CBC  TROPONIN I    EKG EKG Interpretation  Date/Time:  Thursday July 24 2017 16:54:09 EDT Ventricular Rate:  76 PR Interval:    QRS Duration: 147 QT Interval:  443 QTC Calculation: 536 R Axis:   29 Text Interpretation:  Sinus rhythm Atrial premature complexes Consider right atrial enlargement Probable left ventricular hypertrophy Prolonged QT interval No significant change was found Confirmed  by Jola Schmidt [id number]) on 07/24/2017 6:27:07 PM   Radiology No results found.  Procedures Procedures (including critical care time)  Medications Ordered in ED Medications  potassium chloride 10 mEq in 100 mL IVPB (10 mEq Intravenous New Bag/Given 07/24/17 1844)     Initial Impression / Assessment and Plan / ED Course  I have reviewed the triage vital signs and the nursing notes.  Pertinent labs & imaging results that were available during my care of the patient were reviewed by me and considered in my medical decision making (see chart for details).     Profound hypokalemia noted.  Patient be admitted to hospital for IV repletion.  Final Clinical Impressions(s) / ED Diagnoses   Final diagnoses:  Syncope, unspecified syncope type  Hypokalemia    ED Discharge Orders    None       Jola Schmidt, MD 08/07/17 2354

## 2017-07-24 NOTE — Plan of Care (Signed)

## 2017-07-24 NOTE — ED Notes (Signed)
ED Provider at bedside. 

## 2017-07-24 NOTE — ED Notes (Signed)
MD made aware of potassium level. No new orders at this time.

## 2017-07-24 NOTE — Progress Notes (Signed)
Patient arrived to unit, transferred to bed by staff. Patient confused, doesn't know name and has to be reoriented. No family at bedside, patient poor historian.  Cardiac telemetry intiated and verified, safety precautions in place.

## 2017-07-25 DIAGNOSIS — R55 Syncope and collapse: Secondary | ICD-10-CM

## 2017-07-25 DIAGNOSIS — F329 Major depressive disorder, single episode, unspecified: Secondary | ICD-10-CM | POA: Diagnosis not present

## 2017-07-25 DIAGNOSIS — Z86010 Personal history of colonic polyps: Secondary | ICD-10-CM | POA: Diagnosis not present

## 2017-07-25 DIAGNOSIS — E876 Hypokalemia: Secondary | ICD-10-CM | POA: Diagnosis not present

## 2017-07-25 DIAGNOSIS — Z66 Do not resuscitate: Secondary | ICD-10-CM | POA: Diagnosis not present

## 2017-07-25 DIAGNOSIS — G301 Alzheimer's disease with late onset: Secondary | ICD-10-CM | POA: Diagnosis not present

## 2017-07-25 DIAGNOSIS — E785 Hyperlipidemia, unspecified: Secondary | ICD-10-CM | POA: Diagnosis not present

## 2017-07-25 DIAGNOSIS — G309 Alzheimer's disease, unspecified: Secondary | ICD-10-CM | POA: Diagnosis not present

## 2017-07-25 DIAGNOSIS — F0281 Dementia in other diseases classified elsewhere with behavioral disturbance: Secondary | ICD-10-CM

## 2017-07-25 DIAGNOSIS — I10 Essential (primary) hypertension: Secondary | ICD-10-CM | POA: Diagnosis not present

## 2017-07-25 DIAGNOSIS — Z880 Allergy status to penicillin: Secondary | ICD-10-CM | POA: Diagnosis not present

## 2017-07-25 DIAGNOSIS — R001 Bradycardia, unspecified: Secondary | ICD-10-CM | POA: Diagnosis not present

## 2017-07-25 DIAGNOSIS — E039 Hypothyroidism, unspecified: Secondary | ICD-10-CM | POA: Diagnosis not present

## 2017-07-25 LAB — BASIC METABOLIC PANEL
Anion gap: 10 (ref 5–15)
Anion gap: 14 (ref 5–15)
Anion gap: 8 (ref 5–15)
BUN: 6 mg/dL — ABNORMAL LOW (ref 8–23)
BUN: 6 mg/dL — ABNORMAL LOW (ref 8–23)
BUN: <5 mg/dL — ABNORMAL LOW (ref 8–23)
CHLORIDE: 107 mmol/L (ref 98–111)
CHLORIDE: 104 mmol/L (ref 98–111)
CO2: 27 mmol/L (ref 22–32)
CO2: 19 mmol/L — AB (ref 22–32)
CO2: 29 mmol/L (ref 22–32)
CREATININE: 0.75 mg/dL (ref 0.44–1.00)
Calcium: 8.8 mg/dL — ABNORMAL LOW (ref 8.9–10.3)
Calcium: 8.7 mg/dL — ABNORMAL LOW (ref 8.9–10.3)
Calcium: 9.1 mg/dL (ref 8.9–10.3)
Chloride: 105 mmol/L (ref 98–111)
Creatinine, Ser: 0.73 mg/dL (ref 0.44–1.00)
Creatinine, Ser: 0.76 mg/dL (ref 0.44–1.00)
GFR calc Af Amer: >60 mL/min (ref >60)
GFR calc non Af Amer: >60 mL/min (ref >60)
GFR calc non Af Amer: >60 mL/min (ref >60)
GLUCOSE: 83 mg/dL (ref 70–99)
Glucose, Bld: 88 mg/dL (ref 70–99)
Glucose, Bld: 101 mg/dL — ABNORMAL HIGH (ref 70–99)
POTASSIUM: 2.9 mmol/L — AB (ref 3.5–5.1)
POTASSIUM: 3.7 mmol/L (ref 3.5–5.1)
Potassium: 3.3 mmol/L — ABNORMAL LOW (ref 3.5–5.1)
SODIUM: 140 mmol/L (ref 135–145)
SODIUM: 141 mmol/L (ref 135–145)
Sodium: 142 mmol/L (ref 135–145)

## 2017-07-25 LAB — TSH: TSH: 1.18 u[IU]/mL (ref 0.350–4.500)

## 2017-07-25 MED ORDER — LOSARTAN POTASSIUM 50 MG PO TABS
100.0000 mg | ORAL_TABLET | Freq: Every day | ORAL | Status: DC
  Administered 2017-07-25 – 2017-07-30 (×5): 100 mg via ORAL
  Filled 2017-07-25 (×7): qty 2

## 2017-07-25 MED ORDER — POTASSIUM CHLORIDE CRYS ER 20 MEQ PO TBCR
40.0000 meq | EXTENDED_RELEASE_TABLET | Freq: Once | ORAL | Status: AC
  Administered 2017-07-25: 40 meq via ORAL
  Filled 2017-07-25: qty 2

## 2017-07-25 NOTE — Progress Notes (Signed)
Patient on low bed.

## 2017-07-25 NOTE — Progress Notes (Signed)
Pt confused, will attempt admission database when family arrives

## 2017-07-25 NOTE — Progress Notes (Signed)
PROGRESS NOTE  Brianna [name] YBW:[id number] DOB: 06/12/1932 DOA: 07/24/2017 PCP: [name], MD  HPI  Brianna [name] is a 84 y.o. year old female with medical history significant for severe dementia, HTN, Hypothyroidism, depression who presented on 07/24/2017 with witnessed syncopal episode and was found to have severe hypokalemia.  Per conversation with her daughter Brianna [name]. Her mother was sitting on her portable commode for several minutes when suddenly she became limp and her head dropped. She was not responsive to her daughter so she called 911 immediately.  When EMS arrived she was still not responsive to them. She is unsure of what they did as she was asked to step out of the room. Prior to this episode she was in her usual state of health.  She drinks about 1 L of caffeine free pepsi a day which she cut back on after a similar episode that was attributed to her poor intake related to hypokalemia at that time. Her daughter admits her diet consists mainly of junk food (honey buns) and sandwiches ( chicken salad). She lives with her husband; her daughter lives next door.    Denies any new medications, no diarrhea, vomiting. She does take miralax but not everyday.   Interval History    ROS:    Subjective No acute complaints  Assessment/Plan: Active Problems:   Hypertension   Dementia   Alzheimer's dementia with behavioral disturbance   Syncope   Depression  Severe Hypokalemia, fluctuating.Potassium was wnl but then went down again today slightly after 40 meQ x3 despite normal Mg. Still much better than <2 on admission. Will check urine k to ensure no renal losses. No episodes of emesis, diarrhea. Takes PRN miralax. No other new medications, no diuretics at baseline. May need oral daily regimen on discharge   Transient Sinus Bradycardia, resolved. Likely related to hypokalemia. Resolved with correction of low k.   Prolonged QTC. Mg wnl. Recheck tomorrow am. Likely related  to hypokalemia  Syncopal episode. Occurred while using the bathroom per daughter. Likely related to hypokalemia. Though vasovagal syncope is also a possibility. No arrhythmias on telemetry monitoring. Troponin negative. EKG with sinus bradycardia. Repeat EKG in am  Severe dementia with behavioral disturbances.  At baseline she does not recognize her daughter (primary caregiver) or her husband for years. Palliative care consult that daughter requests understanding this is likely end stage for her mother and interested in not having to come to hospital if necessary. Home haldol PRN, trazodone,  Depression. Home effexor  HTN. Elevated to 150s-160s. Held on admission with SBPs in 100s. Will start home losartan now and monitor. Continue to hold Ca channel blocker for now  Hypothyroidism. Check TSH. Continue synthroid  Code Status: Full Code   Family Communication: spoke to her daughter over the phone  Disposition Plan: repeat BMP in am, may need oral potassium at baseline?   Consultants: none  Procedures:  none  Antimicrobials: Anti-infectives (From admission, onward)   None         Cultures: none    Telemetry:  DVT prophylaxis: lovenox   Objective: Vitals:   07/24/17 2000 07/24/17 2035 07/24/17 2346 07/25/17 0410  BP: (!) 161/69 (!) 149/70 138/64 (!) 157/68  Pulse: 68 69 68 (!) 52  Resp: (!) 22 20 18 18   Temp:  97.7 F (36.5 C) 98 F (36.7 C) 97.8 F (36.6 C)  TempSrc:  Oral Axillary Axillary  SpO2: 100% 100% 100% 99%  Weight:  49.5 kg (109 lb 2 oz)  49.3 kg (108 lb 11 oz)    Intake/Output Summary (Last 24 hours) at 07/25/2017 0905 Last data filed at 07/25/2017 4098 Gross per 24 hour  Intake 1022.19 ml  Output -  Net 1022.19 ml   Filed Weights   07/24/17 2035 07/25/17 0410  Weight: 49.5 kg (109 lb 2 oz) 49.3 kg (108 lb 11 oz)    Exam:  Constitutional: frail, elderly female Eyes: EOMI, anicteric, normal conjunctivae ENMT: Oropharynx with moist  mucous membranes, poor dentition Neck: FROM, no thyromegaly Cardiovascular: RRR no MRGs, with no peripheral edema Respiratory: Normal respiratory effort on room air, clear breath sounds  Abdomen: Soft,non-tender, with no HSM Skin: Scattered healed scars and abrasions.   Neurologic: Grossly no focal neuro deficit.  Psychiatric: Irritable. Mental status alert to self and place. Data Reviewed: CBC: Recent Labs  Lab 07/24/17 1652  WBC 9.9  HGB 12.3  HCT 38.1  MCV 84.3  PLT 119   Basic Metabolic Panel: Recent Labs  Lab 07/24/17 1652 07/25/17 0329 07/25/17 0701  NA 141 142 140  K <2.0* 2.9* 3.7  CL 99 105 107  CO2 28 27 19*  GLUCOSE 114* 83 88  BUN 11 6* 6*  CREATININE 1.05* 0.73 0.75  CALCIUM 9.3 8.8* 8.7*  MG 1.9  --   --    GFR: Estimated Creatinine Clearance: 38.3 mL/min (by C-G formula based on SCr of 0.75 mg/dL). Liver Function Tests: Recent Labs  Lab 07/24/17 1652  AST 14*  ALT 6  ALKPHOS 64  BILITOT 0.7  PROT 7.1  ALBUMIN 3.3*   No results for input(s): LIPASE, AMYLASE in the last 168 hours. No results for input(s): AMMONIA in the last 168 hours. Coagulation Profile: No results for input(s): INR, PROTIME in the last 168 hours. Cardiac Enzymes: Recent Labs  Lab 07/24/17 1652  TROPONINI <0.03   BNP (last 3 results) No results for input(s): PROBNP in the last 8760 hours. HbA1C: No results for input(s): HGBA1C in the last 72 hours. CBG: Recent Labs  Lab 07/24/17 1657  GLUCAP 109*   Lipid Profile: No results for input(s): CHOL, HDL, LDLCALC, TRIG, CHOLHDL, LDLDIRECT in the last 72 hours. Thyroid Function Tests: No results for input(s): TSH, T4TOTAL, FREET4, T3FREE, THYROIDAB in the last 72 hours. Anemia Panel: No results for input(s): VITAMINB12, FOLATE, FERRITIN, TIBC, IRON, RETICCTPCT in the last 72 hours. Urine analysis:    Component Value Date/Time   COLORURINE STRAW (A) 01/06/2017 1430   APPEARANCEUR CLEAR 01/06/2017 1430   LABSPEC  1.004 (L) 01/06/2017 1430   PHURINE 6.0 01/06/2017 1430   GLUCOSEU NEGATIVE 01/06/2017 1430   HGBUR SMALL (A) 01/06/2017 1430   BILIRUBINUR NEGATIVE 01/06/2017 1430   KETONESUR NEGATIVE 01/06/2017 1430   PROTEINUR NEGATIVE 01/06/2017 1430   UROBILINOGEN 0.2 02/04/2014 1509   NITRITE NEGATIVE 01/06/2017 1430   LEUKOCYTESUR NEGATIVE 01/06/2017 1430   Sepsis Labs: @LABRCNTIP (procalcitonin:4,lacticidven:4)  )No results found for this or any previous visit (from the past 240 hour(s)).    Studies: Dg Chest Portable 1 View  Result Date: 07/24/2017 CLINICAL DATA:  Syncope EXAM: PORTABLE CHEST 1 VIEW COMPARISON:  01/06/2017 FINDINGS: The patient is slightly rotated on the study. Stable cardiomegaly with aortic atherosclerosis. Clear lungs without acute pulmonary consolidation or CHF. Osteoarthritis of the right AC and glenohumeral joints. IMPRESSION: Cardiomegaly with aortic atherosclerosis. No active pulmonary disease. Electronically Signed   By: Ashley Royalty M.D.   On: 07/24/2017 19:34    Scheduled Meds: . enoxaparin (LOVENOX) injection  40 mg  Subcutaneous Q24H  . levothyroxine  100 mcg Oral QAC breakfast  . pantoprazole  40 mg Oral Daily  . polyethylene glycol  17 g Oral Daily  . traZODone  25 mg Oral QHS  . venlafaxine XR  75 mg Oral Q breakfast    Continuous Infusions: . sodium chloride Stopped (07/25/17 0140)     LOS: 0 days     Desiree Hane, MD [facility] Pager [phone]  If 7PM-7AM, please contact night-coverage [email or web address] Password [facility] 07/25/2017, 9:05 AM

## 2017-07-26 DIAGNOSIS — E876 Hypokalemia: Secondary | ICD-10-CM | POA: Diagnosis not present

## 2017-07-26 DIAGNOSIS — Z66 Do not resuscitate: Secondary | ICD-10-CM | POA: Diagnosis not present

## 2017-07-26 DIAGNOSIS — E039 Hypothyroidism, unspecified: Secondary | ICD-10-CM | POA: Diagnosis not present

## 2017-07-26 DIAGNOSIS — G309 Alzheimer's disease, unspecified: Secondary | ICD-10-CM

## 2017-07-26 DIAGNOSIS — E785 Hyperlipidemia, unspecified: Secondary | ICD-10-CM | POA: Diagnosis not present

## 2017-07-26 DIAGNOSIS — R55 Syncope and collapse: Secondary | ICD-10-CM | POA: Diagnosis not present

## 2017-07-26 DIAGNOSIS — I10 Essential (primary) hypertension: Secondary | ICD-10-CM | POA: Diagnosis not present

## 2017-07-26 DIAGNOSIS — Z86010 Personal history of colonic polyps: Secondary | ICD-10-CM | POA: Diagnosis not present

## 2017-07-26 DIAGNOSIS — R001 Bradycardia, unspecified: Secondary | ICD-10-CM | POA: Diagnosis not present

## 2017-07-26 DIAGNOSIS — F0281 Dementia in other diseases classified elsewhere with behavioral disturbance: Secondary | ICD-10-CM | POA: Diagnosis not present

## 2017-07-26 DIAGNOSIS — F329 Major depressive disorder, single episode, unspecified: Secondary | ICD-10-CM | POA: Diagnosis not present

## 2017-07-26 DIAGNOSIS — Z880 Allergy status to penicillin: Secondary | ICD-10-CM | POA: Diagnosis not present

## 2017-07-26 LAB — BASIC METABOLIC PANEL
ANION GAP: 10 (ref 5–15)
ANION GAP: 9 (ref 5–15)
BUN: <5 mg/dL — ABNORMAL LOW (ref 8–23)
CALCIUM: 8.9 mg/dL (ref 8.9–10.3)
CHLORIDE: 105 mmol/L (ref 98–111)
CO2: 26 mmol/L (ref 22–32)
CO2: 26 mmol/L (ref 22–32)
Calcium: 9.1 mg/dL (ref 8.9–10.3)
Chloride: 103 mmol/L (ref 98–111)
Creatinine, Ser: 0.8 mg/dL (ref 0.44–1.00)
Creatinine, Ser: 0.9 mg/dL (ref 0.44–1.00)
GFR, EST NON AFRICAN AMERICAN: 58 mL/min — AB (ref >60)
Glucose, Bld: 74 mg/dL (ref 70–99)
Glucose, Bld: 103 mg/dL — ABNORMAL HIGH (ref 70–99)
POTASSIUM: 3.1 mmol/L — AB (ref 3.5–5.1)
Potassium: 2.9 mmol/L — ABNORMAL LOW (ref 3.5–5.1)
SODIUM: 140 mmol/L (ref 135–145)
Sodium: 139 mmol/L (ref 135–145)

## 2017-07-26 LAB — TROPONIN I

## 2017-07-26 LAB — MAGNESIUM: MAGNESIUM: 1.8 mg/dL (ref 1.7–2.4)

## 2017-07-26 MED ORDER — NISOLDIPINE ER 17 MG PO TB24
17.0000 mg | ORAL_TABLET | ORAL | Status: DC
  Administered 2017-07-27: 17 mg via ORAL
  Filled 2017-07-26 (×2): qty 1

## 2017-07-26 MED ORDER — POTASSIUM CHLORIDE CRYS ER 20 MEQ PO TBCR
40.0000 meq | EXTENDED_RELEASE_TABLET | Freq: Once | ORAL | Status: AC
Start: 2017-07-26 — End: 2017-07-26
  Administered 2017-07-26: 40 meq via ORAL
  Filled 2017-07-26: qty 2

## 2017-07-26 NOTE — Progress Notes (Signed)
PROGRESS NOTE  Brianna [name] QMV:[id number] DOB: [birth year] DOA: 07/24/2017 PCP: [name], MD  HPI  Brianna [name] is a 84 y.o. year old female with medical history significant for severe dementia, HTN, Hypothyroidism, depression who presented on 07/24/2017 with witnessed syncopal episode and was found to have severe hypokalemia.  Per conversation with her daughter Merrilyn Puma. Her mother was sitting on her portable commode for several minutes when suddenly she became limp and her head dropped. She was not responsive to her daughter so she called 911 immediately.  When EMS arrived she was still not responsive to them. She is unsure of what they did as she was asked to step out of the room. Prior to this episode she was in her usual state of health.  She drinks about 1 L of caffeine free pepsi a day which she cut back on after a similar episode that was attributed to her poor intake related to hypokalemia at that time. Her daughter admits her diet consists mainly of junk food (honey buns) and sandwiches ( chicken salad). She lives with her husband; her daughter lives next door.    Denies any new medications, no diarrhea, vomiting. She does take miralax but not everyday.   Interval History    ROS:    Subjective No acute complaints  Assessment/Plan: Active Problems:   Hypertension   Dementia   Alzheimer's dementia with behavioral disturbance   Syncope   Depression  Severe Hypokalemia, fluctuating. Unclear why potassium continuing to decrease despite normal mag and aggressive repletion regimen. Patient has had multiple loose stool maybe GI loss? Stop miralax, repeat BMP this pm. Awaiting urine potassium though will be difficult to obtain with her level of dementia. QTC continues to be prolonged on today's EKG.  May need oral daily regimen on discharge   Transient Sinus Bradycardia, resolved. Likely related to hypokalemia. Resolved with correction of low k.   Prolonged QTC. Mg wnl.  Persists, however, K is still low.   Syncopal episode. Occurred while using the bathroom per daughter. Likely related to hypokalemia. Though vasovagal syncope is also a possibility. No arrhythmias on telemetry monitoring. Troponin negative. EKG with sinus bradycardia initially.  Severe dementia with behavioral disturbances.  At baseline she does not recognize her daughter (primary caregiver) or her husband for years. Palliative care consult that daughter requests understanding this is likely end stage for her mother and interested in not having to come to hospital if necessary. Home haldol PRN, trazodone,  Depression. Home effexor  HTN, somewhat improved. Held home losartan on admission with SBPs in 100s. continue home losartan now and monitor. Restart home Ca channel blocker for now  Hypothyroidism. TSH wnl. Continue synthroid  Code Status: Full Code   Family Communication: will update daughter  Disposition Plan: repeat BMP in pm, may need oral potassium at baseline?   Consultants: none  Procedures:  none  Antimicrobials: Anti-infectives (From admission, onward)   None        Cultures: none    Telemetry:  DVT prophylaxis: lovenox   Objective: Vitals:   07/26/17 0000 07/26/17 0529 07/26/17 0758 07/26/17 1200  BP: (!) 152/74 (!) 185/76 (!) 169/80 136/61  Pulse: 60 63 71 (!) 54  Resp:  20  18  Temp:  98.3 F (36.8 C)  98.3 F (36.8 C)  TempSrc:  Oral  Oral  SpO2:  97%  95%  Weight:  49 kg (108 lb)      Intake/Output Summary (Last 24 hours) at 07/26/2017  1711 Last data filed at 07/26/2017 1321 Gross per 24 hour  Intake 360 ml  Output 1 ml  Net 359 ml   Filed Weights   07/24/17 2035 07/25/17 0410 07/26/17 0529  Weight: 49.5 kg (109 lb 2 oz) 49.3 kg (108 lb 11 oz) 49 kg (108 lb)    Exam:  Constitutional: frail, elderly female Cardiovascular:  no peripheral edema Respiratory: Normal respiratory effort on room air  Skin: Scattered healed scars and  abrasions.   Neurologic: Grossly no focal neuro deficit.  Psychiatric: Irritable. Mental status alert to self and place. Data Reviewed: CBC: Recent Labs  Lab 07/24/17 1652  WBC 9.9  HGB 12.3  HCT 38.1  MCV 84.3  PLT 563   Basic Metabolic Panel: Recent Labs  Lab 07/24/17 1652 07/25/17 0329 07/25/17 0701 07/25/17 1035 07/26/17 0453 07/26/17 0722  NA 141 142 140 141 139  --   K <2.0* 2.9* 3.7 3.3* 2.9*  --   CL 99 105 107 104 103  --   CO2 28 27 19* 29 26  --   GLUCOSE 114* 83 88 101* 74  --   BUN 11 6* 6* <5* <5*  --   CREATININE 1.05* 0.73 0.75 0.76 0.80  --   CALCIUM 9.3 8.8* 8.7* 9.1 8.9  --   MG 1.9  --   --   --   --  1.8   GFR: Estimated Creatinine Clearance: 38.3 mL/min (by C-G formula based on SCr of 0.8 mg/dL). Liver Function Tests: Recent Labs  Lab 07/24/17 1652  AST 14*  ALT 6  ALKPHOS 64  BILITOT 0.7  PROT 7.1  ALBUMIN 3.3*   No results for input(s): LIPASE, AMYLASE in the last 168 hours. No results for input(s): AMMONIA in the last 168 hours. Coagulation Profile: No results for input(s): INR, PROTIME in the last 168 hours. Cardiac Enzymes: Recent Labs  Lab 07/24/17 1652 07/25/17 2348 07/26/17 0453  TROPONINI <0.03 <0.03 <0.03   BNP (last 3 results) No results for input(s): PROBNP in the last 8760 hours. HbA1C: No results for input(s): HGBA1C in the last 72 hours. CBG: Recent Labs  Lab 07/24/17 1657  GLUCAP 109*   Lipid Profile: No results for input(s): CHOL, HDL, LDLCALC, TRIG, CHOLHDL, LDLDIRECT in the last 72 hours. Thyroid Function Tests: Recent Labs    07/25/17 1850  TSH 1.180   Anemia Panel: No results for input(s): VITAMINB12, FOLATE, FERRITIN, TIBC, IRON, RETICCTPCT in the last 72 hours. Urine analysis:    Component Value Date/Time   COLORURINE STRAW (A) 01/06/2017 1430   APPEARANCEUR CLEAR 01/06/2017 1430   LABSPEC 1.004 (L) 01/06/2017 1430   PHURINE 6.0 01/06/2017 1430   GLUCOSEU NEGATIVE 01/06/2017 1430   HGBUR  SMALL (A) 01/06/2017 1430   BILIRUBINUR NEGATIVE 01/06/2017 1430   KETONESUR NEGATIVE 01/06/2017 1430   PROTEINUR NEGATIVE 01/06/2017 1430   UROBILINOGEN 0.2 02/04/2014 1509   NITRITE NEGATIVE 01/06/2017 1430   LEUKOCYTESUR NEGATIVE 01/06/2017 1430   Sepsis Labs: @LABRCNTIP (procalcitonin:4,lacticidven:4)  )No results found for this or any previous visit (from the past 240 hour(s)).    Studies: No results found.  Scheduled Meds: . enoxaparin (LOVENOX) injection  40 mg Subcutaneous Q24H  . levothyroxine  100 mcg Oral QAC breakfast  . losartan  100 mg Oral Daily  . pantoprazole  40 mg Oral Daily  . traZODone  25 mg Oral QHS  . venlafaxine XR  75 mg Oral Q breakfast    Continuous Infusions: . sodium chloride  Stopped (07/25/17 0140)     LOS: 0 days     Desiree Hane, MD [facility] Pager [phone]  If 7PM-7AM, please contact night-coverage [email or web address] Password [email or web address] 07/26/2017, 5:11 PM

## 2017-07-27 DIAGNOSIS — F0281 Dementia in other diseases classified elsewhere with behavioral disturbance: Secondary | ICD-10-CM | POA: Diagnosis not present

## 2017-07-27 DIAGNOSIS — F329 Major depressive disorder, single episode, unspecified: Secondary | ICD-10-CM | POA: Diagnosis not present

## 2017-07-27 DIAGNOSIS — Z515 Encounter for palliative care: Secondary | ICD-10-CM

## 2017-07-27 DIAGNOSIS — R55 Syncope and collapse: Secondary | ICD-10-CM | POA: Diagnosis not present

## 2017-07-27 DIAGNOSIS — G309 Alzheimer's disease, unspecified: Secondary | ICD-10-CM | POA: Diagnosis not present

## 2017-07-27 DIAGNOSIS — I10 Essential (primary) hypertension: Secondary | ICD-10-CM | POA: Diagnosis not present

## 2017-07-27 DIAGNOSIS — E876 Hypokalemia: Secondary | ICD-10-CM | POA: Diagnosis not present

## 2017-07-27 MED ORDER — POTASSIUM CHLORIDE CRYS ER 20 MEQ PO TBCR
40.0000 meq | EXTENDED_RELEASE_TABLET | Freq: Once | ORAL | Status: DC
  Filled 2017-07-27: qty 2

## 2017-07-27 MED ORDER — HYDRALAZINE HCL 20 MG/ML IJ SOLN
5.0000 mg | INTRAMUSCULAR | Status: DC | PRN
  Administered 2017-07-27: 5 mg via INTRAVENOUS
  Filled 2017-07-27: qty 1

## 2017-07-27 NOTE — Progress Notes (Signed)
Patient refused to take her PO AM meds, MD notified.

## 2017-07-27 NOTE — Plan of Care (Signed)
  Problem: Activity: Goal: Risk for activity intolerance will decrease Outcome: Adequate for Discharge   Problem: Coping: Goal: Level of anxiety will decrease Outcome: Adequate for Discharge   Problem: Safety: Goal: Ability to remain free from injury will improve Outcome: Adequate for Discharge

## 2017-07-27 NOTE — Progress Notes (Signed)
PROGRESS NOTE  Brianna [name] TKP:[id number] DOB: 1933/01/05 DOA: 07/24/2017 PCP: [name], MD  HPI  Brianna [name] is a 84 y.o. year old female with medical history significant for severe dementia, HTN, Hypothyroidism, depression who presented on 07/24/2017 with witnessed syncopal episode and was found to have severe hypokalemia.  Per conversation with her daughter Merrilyn Puma. Her mother was sitting on her portable commode for several minutes when suddenly she became limp and her head dropped. She was not responsive to her daughter so she called 911 immediately.  When EMS arrived she was still not responsive to them. She is unsure of what they did as she was asked to step out of the room. Prior to this episode she was in her usual state of health.  She drinks about 1 L of caffeine free pepsi a day which she cut back on after a similar episode that was attributed to her poor intake related to hypokalemia at that time. Her daughter admits her diet consists mainly of junk food (honey buns) and sandwiches ( chicken salad). She lives with her husband; her daughter lives next door.    Denies any new medications, no diarrhea, vomiting. She does take miralax but not everyday.   Interval History No acute events overnight   ROS:    Subjective Refusing blood draws and all food  Assessment/Plan: Active Problems:   Hypertension   Dementia   Alzheimer's dementia with behavioral disturbance   Syncope   Depression   Palliative care encounter  Severe Hypokalemia, fluctuating. Likely multifactorial with some GI loss which seems resolved now that miralax d/c'd. Poor diet likely contributing as well. Patient is severely demented and is refusing all lab draws and food currently. unless her mental status improves she could become more symptomatic given she's refusing repletion. . QTC continues to be prolonged on today's EKG.  May need oral daily regimen on discharge   Transient Sinus Bradycardia,  resolved. Likely related to hypokalemia. Resolved with correction of low k.   Prolonged QTC. Mg wnl. Persists, however, K is still low.   Syncopal episode. Occurred while using the bathroom per daughter. Likely related to hypokalemia. Though vasovagal syncope is also a possibility. No arrhythmias on telemetry monitoring. Troponin negative. EKG with sinus bradycardia initially.  Severe dementia with behavioral disturbances.  Seems like this is end stage given little to no oral intake the last few days in addition to severely poor mental status at baseline ( doesn't recognize daughter, who is primary caregiver), and poor quality of life even at home. If no meaningful improvement soon family seems agreeable to consider hospice referral.  Home haldol PRN, trazodone,  Depression. Home effexor  HTN, somewhat improved. Very limited as with patient's poor mental status she is declining all medication. Intermittently allows [place] to give home BP meds.  Continue to monitor  Hypothyroidism. TSH wnl. Continue synthroid  Code Status: After discussion with palliative team daughter changed to DNR   Family Communication: will update daughter  Disposition Plan: very likely will need hospice referral if no improvement in mental status    Consultants: none  Procedures:  none  Antimicrobials: Anti-infectives (From admission, onward)   None        Cultures: none    Telemetry:  DVT prophylaxis: lovenox   Objective: Vitals:   07/27/17 0551 07/27/17 0654 07/27/17 0704 07/27/17 0858  BP: (!) 163/59 (!) 177/62 (!) 155/64 (!) 180/88  Pulse: (!) 52 (!) 50    Resp: 18 18  Temp: 97.8 F (36.6 C) 98.3 F (36.8 C)    TempSrc: Oral Oral    SpO2: 97% 98%    Weight:  48.5 kg (107 lb)      Intake/Output Summary (Last 24 hours) at 07/27/2017 1333 Last data filed at 07/27/2017 3557 Gross per 24 hour  Intake 240 ml  Output 0 ml  Net 240 ml   Filed Weights   07/25/17 0410 07/26/17 0529  07/27/17 0654  Weight: 49.3 kg (108 lb 11 oz) 49 kg (108 lb) 48.5 kg (107 lb)    Exam:  Constitutional: frail, elderly female, lying in bed in no distress Cardiovascular:  no peripheral edema Respiratory: Normal respiratory effort on room air  Skin: Scattered healed scars and abrasions.   Neurologic: Grossly no focal neuro deficit.  Psychiatric: Irritable. Mental status alert to self only.  Data Reviewed: CBC: Recent Labs  Lab 07/24/17 1652  WBC 9.9  HGB 12.3  HCT 38.1  MCV 84.3  PLT 322   Basic Metabolic Panel: Recent Labs  Lab 07/24/17 1652 07/25/17 0329 07/25/17 0701 07/25/17 1035 07/26/17 0453 07/26/17 0722 07/26/17 1917  NA 141 142 140 141 139  --  140  K <2.0* 2.9* 3.7 3.3* 2.9*  --  3.1*  CL 99 105 107 104 103  --  105  CO2 28 27 19* 29 26  --  26  GLUCOSE 114* 83 88 101* 74  --  103*  BUN 11 6* 6* <5* <5*  --  <5*  CREATININE 1.05* 0.73 0.75 0.76 0.80  --  0.90  CALCIUM 9.3 8.8* 8.7* 9.1 8.9  --  9.1  MG 1.9  --   --   --   --  1.8  --    GFR: Estimated Creatinine Clearance: 34 mL/min (by C-G formula based on SCr of 0.9 mg/dL). Liver Function Tests: Recent Labs  Lab 07/24/17 1652  AST 14*  ALT 6  ALKPHOS 64  BILITOT 0.7  PROT 7.1  ALBUMIN 3.3*   No results for input(s): LIPASE, AMYLASE in the last 168 hours. No results for input(s): AMMONIA in the last 168 hours. Coagulation Profile: No results for input(s): INR, PROTIME in the last 168 hours. Cardiac Enzymes: Recent Labs  Lab 07/24/17 1652 07/25/17 2348 07/26/17 0453  TROPONINI <0.03 <0.03 <0.03   BNP (last 3 results) No results for input(s): PROBNP in the last 8760 hours. HbA1C: No results for input(s): HGBA1C in the last 72 hours. CBG: Recent Labs  Lab 07/24/17 1657  GLUCAP 109*   Lipid Profile: No results for input(s): CHOL, HDL, LDLCALC, TRIG, CHOLHDL, LDLDIRECT in the last 72 hours. Thyroid Function Tests: Recent Labs    07/25/17 1850  TSH 1.180   Anemia Panel: No  results for input(s): VITAMINB12, FOLATE, FERRITIN, TIBC, IRON, RETICCTPCT in the last 72 hours. Urine analysis:    Component Value Date/Time   COLORURINE STRAW (A) 01/06/2017 1430   APPEARANCEUR CLEAR 01/06/2017 1430   LABSPEC 1.004 (L) 01/06/2017 1430   PHURINE 6.0 01/06/2017 1430   GLUCOSEU NEGATIVE 01/06/2017 1430   HGBUR SMALL (A) 01/06/2017 1430   BILIRUBINUR NEGATIVE 01/06/2017 1430   KETONESUR NEGATIVE 01/06/2017 1430   PROTEINUR NEGATIVE 01/06/2017 1430   UROBILINOGEN 0.2 02/04/2014 1509   NITRITE NEGATIVE 01/06/2017 1430   LEUKOCYTESUR NEGATIVE 01/06/2017 1430   Sepsis Labs: @LABRCNTIP (procalcitonin:4,lacticidven:4)  )No results found for this or any previous visit (from the past 240 hour(s)).    Studies: No results found.  Scheduled Meds: .  enoxaparin (LOVENOX) injection  40 mg Subcutaneous Q24H  . levothyroxine  100 mcg Oral QAC breakfast  . losartan  100 mg Oral Daily  . nisoldipine  17 mg Oral BH-q7a  . pantoprazole  40 mg Oral Daily  . potassium chloride  40 mEq Oral Once  . traZODone  25 mg Oral QHS  . venlafaxine XR  75 mg Oral Q breakfast    Continuous Infusions: . sodium chloride Stopped (07/25/17 0140)     LOS: 0 days     Desiree Hane, MD [facility] Pager [phone]  If 7PM-7AM, please contact night-coverage [email or web address] Password [email or web address] 07/27/2017, 1:33 PM

## 2017-07-27 NOTE — Consult Note (Signed)
Consultation Note Date: 07/27/2017   Patient Name: Brianna [name]  DOB: March 02, 1932  MRN: [id number]  Age / Sex: 84 y.o., female  PCP: Brianna Bowen, MD Referring Physician: Desiree Hane, MD  Reason for Consultation: Establishing goals of care  HPI/Patient Profile: 84 y.o. female  with past medical history of advanced dementia, HTN, hypothyroidism, and depression, who was admitted on 07/24/2017 with syncope and hypokalemia. Patient's hospitalization has been complicated by persistent hypokalemia, agitation, and lethargy. Palliative care has been consulted to help establish goals of care.   Clinical Assessment and Goals of Care: Patient is somewhat lethargic. She has not been eating or drinking today. She is confused at baseline and unable to participate in meaningful conversations. Sitter is at bedside.   I called and spoke with patient's husband and daughter. Daughter was tearful after having been told earlier that patient was not eating, drinking, or taking her medications. We talked about patient's current medical status and possibility that she could have future decline. Daughter recognizes that if oral intake remains persistently poor, that it might reflect an end of life situation. She is in agreement with continuation of current level of care. However, she seemed open to revisiting goals in the event that patient fails to improve. We discussed option of hospice care (either at home or residential facility). Daughter had recently requested community palliative care services from Dr. Forde [name] and I explained in detail what that would entail.   Prior to the hospitalization, patient had been living at home with her husband. Daughter lived next door. Patient was mostly sedentary in the chair and significantly limited by her cognition. Daughter says that patient was generally unhappy and that her quality of life was  extremely poor.   I discussed code status at length with husband and daughter. They verbalized an understanding that resuscitation would be unlikely to result in a meaningful improvement. Daughter also again verbalized a perception that patient's quality of life was poor. Husband and daughter both agreed that patient should not be resuscitated or sustained artificially by machines. They agreed to DNR.   Attending updated.   SUMMARY OF RECOMMENDATIONS   DNR Continue supportive care for now Consider hospice referral in the event of no improvement     Primary Diagnoses: Present on Admission: . Hypertension . Alzheimer's dementia with behavioral disturbance . Depression . Dementia . Syncope   I have reviewed the medical record, interviewed the patient and family, and examined the patient. The following aspects are pertinent.  Past Medical History:  Diagnosis Date  . Acute encephalopathy 01/06/2017  . Acute metabolic encephalopathy 4/48/1856  . Adenomatous colon polyp   . AKI (acute kidney injury) ([place]) 03/14/2015  . Alzheimer's dementia with behavioral disturbance 10/11/2014  . Dementia   . Depression 05/06/2016  . Depression with anxiety   . DUB (dysfunctional uterine bleeding)   . Elevated cholesterol   . Hyperlipidemia   . Hypertension   . Insomnia 05/06/2016  . Iron deficiency anemia 10/26/2013  . Kidney stone   .  Sleep apnea   . Thyroid disease    Hypothyroid  . Vitamin D deficiency 05/06/2016  . Weakness 02/13/2013   Social History   Socioeconomic History  . Marital status: Married    Spouse name: Brianna [name]  . Number of children: 4  . Years of education: 8  . Highest education level: Not on file  Occupational History    Employer: RETIRED  Social Needs  . Financial resource strain: Not on file  . Food insecurity:    Worry: Not on file    Inability: Not on file  . Transportation needs:    Medical: Not on file    Non-medical: Not on file  Tobacco Use  . Smoking  status: Never Smoker  . Smokeless tobacco: Never Used  Substance and Sexual Activity  . Alcohol use: No  . Drug use: No  . Sexual activity: Not on file  Lifestyle  . Physical activity:    Days per week: Not on file    Minutes per session: Not on file  . Stress: Not on file  Relationships  . Social connections:    Talks on phone: Not on file    Gets together: Not on file    Attends religious service: Not on file    Active member of club or organization: Not on file    Attends meetings of clubs or organizations: Not on file    Relationship status: Not on file  Other Topics Concern  . Not on file  Social History Narrative   Patient is married Brianna [name]).   Patient has four children.   Patient has an 8th grade education.   Patient is retired.   Patient is right-handed.   Patient drinks 3- 2 liters of Pepsi daily.      Admitted to [facility] 01/09/17   Never smoked   Alcohol none   Full Code   Family History  Problem Relation Age of Onset  . Diabetes Mother   . Heart disease Father   . Hypertension Brother   . Heart disease Brother   . Diabetes type II Brother   . Diabetes type II Sister   . Thyroid disease Child   . Diabetes Child    Scheduled Meds: . enoxaparin (LOVENOX) injection  40 mg Subcutaneous Q24H  . levothyroxine  100 mcg Oral QAC breakfast  . losartan  100 mg Oral Daily  . nisoldipine  17 mg Oral BH-q7a  . pantoprazole  40 mg Oral Daily  . potassium chloride  40 mEq Oral Once  . traZODone  25 mg Oral QHS  . venlafaxine XR  75 mg Oral Q breakfast   Continuous Infusions: . sodium chloride Stopped (07/25/17 0140)   PRN Meds:.sodium chloride, acetaminophen **OR** acetaminophen, haloperidol, hydrALAZINE, ondansetron **OR** ondansetron (ZOFRAN) IV Medications Prior to Admission:  Prior to Admission medications   Medication Sig Start Date End Date Taking? Authorizing Provider  esomeprazole (NEXIUM) 40 MG capsule Take 40 mg by mouth every  morning.    Yes [provider]  haloperidol (HALDOL) 1 MG tablet Take 1 mg by mouth daily as needed for agitation. #7- for severe agitation   Yes [provider]  iron polysaccharides (NU-IRON) 150 MG capsule Take 150 mg by mouth daily.  06/02/12  Yes [provider]  levothyroxine (SYNTHROID, LEVOTHROID) 100 MCG tablet Take 100 mcg by mouth daily before breakfast.  07/30/15  Yes [provider]  losartan (COZAAR) 100 MG tablet Take 100 mg by mouth  daily. 12/21/16  Yes [provider]  nisoldipine (SULAR) 17 MG 24 hr tablet Take 17 mg by mouth every morning.  03/13/09  Yes [provider]  PRISTIQ 50 MG 24 hr tablet Take 50 mg by mouth every morning.  04/05/13  Yes [provider]  traZODone (DESYREL) 50 MG tablet Take 25 mg by mouth at bedtime.  02/16/13  Yes [provider]  Vitamin D, Ergocalciferol, (DRISDOL) 50000 UNITS CAPS capsule Take 50,000 Units by mouth every 7 (seven) days. Tuesday   Yes [provider]   Allergies  Allergen Reactions  . Penicillins Other (See Comments)    "didn't sit well" Has patient had a PCN reaction causing immediate rash, facial/tongue/throat swelling, SOB or lightheadedness with hypotension: No Has patient had a PCN reaction causing severe rash involving mucus membranes or skin necrosis: No Has patient had a PCN reaction that required hospitalization No Has patient had a PCN reaction occurring within the last 10 years: No If all of the above answers are "NO", then may proceed with Cephalosporin use.   Review of Systems  Unable to perform ROS   Physical Exam  Constitutional:  Frail appearing  Cardiovascular: Normal rate and regular rhythm.  Pulmonary/Chest: Effort normal.  Abdominal: Soft.  Neurological:  Lethargic, speaks with stimulation but did not open eyes or follow commands    Vital Signs: BP (!) 180/88   Pulse (!) 50   Temp 98.3 F (36.8 C) (Oral)   Resp 18   Wt  48.5 kg (107 lb)   SpO2 98%   BMI 20.90 kg/m  Pain Scale: Faces(couldnt assess patient refused tgo wake up)   Pain Score: 0-No pain   SpO2: SpO2: 98 % O2 Device:SpO2: 98 % O2 Flow Rate: .   IO: Intake/output summary:   Intake/Output Summary (Last 24 hours) at 07/27/2017 1303 Last data filed at 07/27/2017 8469 Gross per 24 hour  Intake 240 ml  Output 0 ml  Net 240 ml    LBM: Last BM Date: 07/27/17 Baseline Weight: Weight: 49.5 kg (109 lb 2 oz) Most recent weight: Weight: 48.5 kg (107 lb)     Palliative Assessment/Data:   Flowsheet Rows     Most Recent Value  Intake Tab  Referral Department  Hospitalist  Palliative Care Primary Diagnosis  Other (Comment)  Date Notified  07/25/17  Palliative Care Type  New Palliative care  Reason for referral  Clarify Goals of Care  Date of Admission  07/24/17  Date first seen by Palliative Care  07/27/17  # of days Palliative referral response time  2 Day(s)  # of days IP prior to Palliative referral  1  Clinical Assessment  Psychosocial & Spiritual Assessment  Palliative Care Outcomes      Time In: 1230 Time Out: 1300 Time Total: 30 minutes Greater than 50%  of this time was spent counseling and coordinating care related to the above assessment and plan.  Signed by: Irean Hong, NP   Please contact Palliative Medicine Team phone at [phone] for questions and concerns.  For individual provider: See Shea Evans

## 2017-07-28 ENCOUNTER — Encounter (HOSPITAL_COMMUNITY): Payer: Self-pay

## 2017-07-28 ENCOUNTER — Other Ambulatory Visit: Payer: Self-pay

## 2017-07-28 DIAGNOSIS — E7800 Pure hypercholesterolemia, unspecified: Secondary | ICD-10-CM | POA: Diagnosis present

## 2017-07-28 DIAGNOSIS — R5381 Other malaise: Secondary | ICD-10-CM | POA: Diagnosis not present

## 2017-07-28 DIAGNOSIS — F0391 Unspecified dementia with behavioral disturbance: Secondary | ICD-10-CM | POA: Diagnosis not present

## 2017-07-28 DIAGNOSIS — R001 Bradycardia, unspecified: Secondary | ICD-10-CM | POA: Diagnosis not present

## 2017-07-28 DIAGNOSIS — Z9049 Acquired absence of other specified parts of digestive tract: Secondary | ICD-10-CM | POA: Diagnosis not present

## 2017-07-28 DIAGNOSIS — Z79890 Hormone replacement therapy (postmenopausal): Secondary | ICD-10-CM | POA: Diagnosis not present

## 2017-07-28 DIAGNOSIS — F329 Major depressive disorder, single episode, unspecified: Secondary | ICD-10-CM | POA: Diagnosis not present

## 2017-07-28 DIAGNOSIS — Z515 Encounter for palliative care: Secondary | ICD-10-CM | POA: Diagnosis not present

## 2017-07-28 DIAGNOSIS — Z87442 Personal history of urinary calculi: Secondary | ICD-10-CM | POA: Diagnosis not present

## 2017-07-28 DIAGNOSIS — E876 Hypokalemia: Secondary | ICD-10-CM | POA: Diagnosis not present

## 2017-07-28 DIAGNOSIS — E785 Hyperlipidemia, unspecified: Secondary | ICD-10-CM | POA: Diagnosis not present

## 2017-07-28 DIAGNOSIS — G4730 Sleep apnea, unspecified: Secondary | ICD-10-CM | POA: Diagnosis present

## 2017-07-28 DIAGNOSIS — Z66 Do not resuscitate: Secondary | ICD-10-CM | POA: Diagnosis not present

## 2017-07-28 DIAGNOSIS — E039 Hypothyroidism, unspecified: Secondary | ICD-10-CM | POA: Diagnosis not present

## 2017-07-28 DIAGNOSIS — G9340 Encephalopathy, unspecified: Secondary | ICD-10-CM | POA: Diagnosis not present

## 2017-07-28 DIAGNOSIS — F0281 Dementia in other diseases classified elsewhere with behavioral disturbance: Secondary | ICD-10-CM | POA: Diagnosis not present

## 2017-07-28 DIAGNOSIS — I10 Essential (primary) hypertension: Secondary | ICD-10-CM | POA: Diagnosis not present

## 2017-07-28 DIAGNOSIS — G301 Alzheimer's disease with late onset: Secondary | ICD-10-CM | POA: Diagnosis not present

## 2017-07-28 DIAGNOSIS — Z8249 Family history of ischemic heart disease and other diseases of the circulatory system: Secondary | ICD-10-CM | POA: Diagnosis not present

## 2017-07-28 DIAGNOSIS — Z86010 Personal history of colonic polyps: Secondary | ICD-10-CM | POA: Diagnosis not present

## 2017-07-28 DIAGNOSIS — Z833 Family history of diabetes mellitus: Secondary | ICD-10-CM | POA: Diagnosis not present

## 2017-07-28 DIAGNOSIS — F418 Other specified anxiety disorders: Secondary | ICD-10-CM | POA: Diagnosis present

## 2017-07-28 DIAGNOSIS — Z880 Allergy status to penicillin: Secondary | ICD-10-CM | POA: Diagnosis not present

## 2017-07-28 DIAGNOSIS — Z90710 Acquired absence of both cervix and uterus: Secondary | ICD-10-CM | POA: Diagnosis not present

## 2017-07-28 DIAGNOSIS — G309 Alzheimer's disease, unspecified: Secondary | ICD-10-CM | POA: Diagnosis not present

## 2017-07-28 DIAGNOSIS — Z436 Encounter for attention to other artificial openings of urinary tract: Secondary | ICD-10-CM | POA: Diagnosis not present

## 2017-07-28 DIAGNOSIS — E559 Vitamin D deficiency, unspecified: Secondary | ICD-10-CM | POA: Diagnosis present

## 2017-07-28 DIAGNOSIS — R55 Syncope and collapse: Secondary | ICD-10-CM | POA: Diagnosis not present

## 2017-07-28 DIAGNOSIS — G4700 Insomnia, unspecified: Secondary | ICD-10-CM | POA: Diagnosis present

## 2017-07-28 DIAGNOSIS — Z743 Need for continuous supervision: Secondary | ICD-10-CM | POA: Diagnosis not present

## 2017-07-28 DIAGNOSIS — R279 Unspecified lack of coordination: Secondary | ICD-10-CM | POA: Diagnosis not present

## 2017-07-28 DIAGNOSIS — D509 Iron deficiency anemia, unspecified: Secondary | ICD-10-CM | POA: Diagnosis present

## 2017-07-28 MED ORDER — SODIUM CHLORIDE 0.9 % IV BOLUS
500.0000 mL | Freq: Once | INTRAVENOUS | Status: AC
  Administered 2017-07-28: 500 mL via INTRAVENOUS

## 2017-07-28 NOTE — Progress Notes (Signed)
PROGRESS NOTE  Brianna [name] PNT:[id number] DOB: 01/09/1933 DOA: 07/24/2017 PCP: [name], MD  HPI  Brianna [name] is a 84 y.o. year old female with medical history significant for severe dementia, HTN, Hypothyroidism, depression who presented on 07/24/2017 with witnessed syncopal episode and was found to have severe hypokalemia.  Per conversation with her daughter Merrilyn Puma. Her mother was sitting on her portable commode for several minutes when suddenly she became limp and her head dropped. She was not responsive to her daughter so she called 911 immediately.  When EMS arrived she was still not responsive to them. She is unsure of what they did as she was asked to step out of the room. Prior to this episode she was in her usual state of health.  She drinks about 1 L of caffeine free pepsi a day which she cut back on after a similar episode that was attributed to her poor intake related to hypokalemia at that time. Her daughter admits her diet consists mainly of junk food (honey buns) and sandwiches ( chicken salad). She lives with her husband; her daughter lives next door.    Denies any new medications, no diarrhea, vomiting. She does take miralax but not everyday.   Hospital Course Throughout hospitalization patient potassium levels have continued to fluctuate but remaining low.  Patient's mental status at baseline is irritable and confused alert only intermittently to self.  Patient has decline all nutrition and now declines lab blood draws and medications.  Palliative care was consulted and goals of care discussion with daughter on 6/30 she was made DNR.  On my discussion on 7/1 with Dr. Merrilyn Puma she elected to pursue hospice care given her mother's end-stage dementia.  We discussed her limitations in treating her low potassium given her persistent confusion related to progression of her dementia and no identifiable reversible causes.   ROS:    Subjective Refusing blood draws and all  food  Assessment/Plan: Active Problems:   Hypertension   Dementia   Alzheimer's dementia with behavioral disturbance   Syncope   Depression   Palliative care encounter  Severe Hypokalemia, fluctuating. Likely multifactorial with some GI loss which seems resolved now that miralax d/c'd. Poor diet likely contributing as well. Patient is severely demented and is refusing all lab draws and food currently her daughter to pursue residential hospice care.  Social work consult will be placed to assist.  Transient Sinus Bradycardia, resolved. Likely related to hypokalemia.   Prolonged QTC. Mg wnl. Persists, however, K is still low.   Syncopal episode. Occurred while using the bathroom per daughter. Likely related to hypokalemia. Though vasovagal syncope is also a possibility. No arrhythmias on telemetry monitoring. Troponin negative. EKG with sinus bradycardia initially.  Severe dementia with behavioral disturbances.  Seems like this is end stage given little to no oral intake the last few days in addition to severely poor mental status at baseline ( doesn't recognize daughter, who is primary caregiver), and poor quality of life even at home.  As above daughter wishes to pursue residential hospice care.  Depression. Home effexor  HTN, somewhat improved. Very limited as with patient's poor mental status she is declining all medication. Intermittently allows [place] to give home BP meds.  Continue to monitor  Hypothyroidism. TSH wnl.  She declining oral medications  Code Status: After discussion with Daughter Merrilyn Puma at bedside this morning.  Will pursue residential hospice placement.  Social worker consult placed.  Continue comfort care measures here.  Family Communication:  Spoke with daughter at bedside  Disposition Plan: Residential hospice, patient with no oral intake in setting of severe end-stage dementia.  Consultants: Palliative  Procedures:  none  Antimicrobials: Anti-infectives  (From admission, onward)   None        Cultures: none    Telemetry:  DVT prophylaxis: lovenox   Objective: Vitals:   07/27/17 0654 07/27/17 0704 07/27/17 0858 07/27/17 2005  BP: (!) 177/62 (!) 155/64 (!) 180/88 (!) 159/72  Pulse: (!) 50   (!) 52  Resp: 18   19  Temp: 98.3 F (36.8 C)   98.6 F (37 C)  TempSrc: Oral   Oral  SpO2: 98%   99%  Weight: 48.5 kg (107 lb)       Intake/Output Summary (Last 24 hours) at 07/28/2017 1024 Last data filed at 07/28/2017 0851 Gross per 24 hour  Intake 500 ml  Output 0 ml  Net 500 ml   Filed Weights   07/25/17 0410 07/26/17 0529 07/27/17 0654  Weight: 49.3 kg (108 lb 11 oz) 49 kg (108 lb) 48.5 kg (107 lb)    Exam:  Constitutional: frail, elderly female, lying in bed in no distress Cardiovascular:  no peripheral edema Respiratory: Normal respiratory effort on room air  Skin: Scattered healed scars and abrasions.   Neurologic: Grossly no focal neuro deficit. Speaking to self and intermittently saying expletives Psychiatric: Irritable. Mental status alert to self only.  Data Reviewed: CBC: Recent Labs  Lab 07/24/17 1652  WBC 9.9  HGB 12.3  HCT 38.1  MCV 84.3  PLT 818   Basic Metabolic Panel: Recent Labs  Lab 07/24/17 1652 07/25/17 0329 07/25/17 0701 07/25/17 1035 07/26/17 0453 07/26/17 0722 07/26/17 1917  NA 141 142 140 141 139  --  140  K <2.0* 2.9* 3.7 3.3* 2.9*  --  3.1*  CL 99 105 107 104 103  --  105  CO2 28 27 19* 29 26  --  26  GLUCOSE 114* 83 88 101* 74  --  103*  BUN 11 6* 6* <5* <5*  --  <5*  CREATININE 1.05* 0.73 0.75 0.76 0.80  --  0.90  CALCIUM 9.3 8.8* 8.7* 9.1 8.9  --  9.1  MG 1.9  --   --   --   --  1.8  --    GFR: Estimated Creatinine Clearance: 34 mL/min (by C-G formula based on SCr of 0.9 mg/dL). Liver Function Tests: Recent Labs  Lab 07/24/17 1652  AST 14*  ALT 6  ALKPHOS 64  BILITOT 0.7  PROT 7.1  ALBUMIN 3.3*   No results for input(s): LIPASE, AMYLASE in the last 168  hours. No results for input(s): AMMONIA in the last 168 hours. Coagulation Profile: No results for input(s): INR, PROTIME in the last 168 hours. Cardiac Enzymes: Recent Labs  Lab 07/24/17 1652 07/25/17 2348 07/26/17 0453  TROPONINI <0.03 <0.03 <0.03   BNP (last 3 results) No results for input(s): PROBNP in the last 8760 hours. HbA1C: No results for input(s): HGBA1C in the last 72 hours. CBG: Recent Labs  Lab 07/24/17 1657  GLUCAP 109*   Lipid Profile: No results for input(s): CHOL, HDL, LDLCALC, TRIG, CHOLHDL, LDLDIRECT in the last 72 hours. Thyroid Function Tests: Recent Labs    07/25/17 1850  TSH 1.180   Anemia Panel: No results for input(s): VITAMINB12, FOLATE, FERRITIN, TIBC, IRON, RETICCTPCT in the last 72 hours. Urine analysis:    Component Value Date/Time   COLORURINE STRAW (A) 01/06/2017 1430  APPEARANCEUR CLEAR 01/06/2017 1430   LABSPEC 1.004 (L) 01/06/2017 1430   PHURINE 6.0 01/06/2017 1430   GLUCOSEU NEGATIVE 01/06/2017 1430   HGBUR SMALL (A) 01/06/2017 1430   BILIRUBINUR NEGATIVE 01/06/2017 1430   KETONESUR NEGATIVE 01/06/2017 1430   PROTEINUR NEGATIVE 01/06/2017 1430   UROBILINOGEN 0.2 02/04/2014 1509   NITRITE NEGATIVE 01/06/2017 1430   LEUKOCYTESUR NEGATIVE 01/06/2017 1430   Sepsis Labs: @LABRCNTIP (procalcitonin:4,lacticidven:4)  )No results found for this or any previous visit (from the past 240 hour(s)).    Studies: No results found.  Scheduled Meds: . enoxaparin (LOVENOX) injection  40 mg Subcutaneous Q24H  . levothyroxine  100 mcg Oral QAC breakfast  . losartan  100 mg Oral Daily  . nisoldipine  17 mg Oral BH-q7a  . pantoprazole  40 mg Oral Daily  . potassium chloride  40 mEq Oral Once  . traZODone  25 mg Oral QHS  . venlafaxine XR  75 mg Oral Q breakfast    Continuous Infusions: . sodium chloride Stopped (07/25/17 0140)     LOS: 0 days     Desiree Hane, MD [facility] Pager [phone]  If 7PM-7AM, please  contact night-coverage [email or web address] Password [facility] 07/28/2017, 10:24 AM

## 2017-07-28 NOTE — Progress Notes (Signed)
Patient made comfort care today Daughter spoke with doctor and was updated and aware of plan of care.  Mrs. Brianna [name] was given total bath today and was incontinent of bowel and bladder.  Unable to measure urine.  Patient offered all 3 meals and refused.  She did take medication this morning with a sip of water.

## 2017-07-28 NOTE — Clinical Social Work Note (Signed)
CSW acknowledges residential hospice consult. Per palliative and attending MD notes, this is the plan if patient does not improve. CSW will continue to follow progress.  Dayton Scrape, [place]

## 2017-07-28 NOTE — Clinical Social Work Note (Addendum)
Clinical Social Work Assessment  Patient Details  Name: Brianna [name] MRN: [id number] Date of Birth: 1932/10/25  Date of referral:  07/28/17               Reason for consult:  Facility Placement, Discharge Planning, End of Life/Hospice                Permission sought to share information with:  Facility Sport and exercise psychologist, Family Supports Permission granted to share information::     Name::     Brianna [name]::  Hospice facilities  Relationship::  Daughter/HCPOA  Contact Information:  [phone]  Housing/Transportation Living arrangements for the past 2 months:  [place] of Information:  Medical Team, Adult Children Patient Interpreter Needed:  None Criminal Activity/Legal Involvement Pertinent to Current Situation/Hospitalization:  No - Comment as needed Significant Relationships:  Adult Children, Spouse Lives with:  Spouse Do you feel safe going back to the place where you live?  Yes Need for family participation in patient care:  Yes (Comment)  Care giving concerns:  Residential hospice referral.   Social Worker assessment / plan:  Patient oriented to self only. No family at bedside. CSW called patient's daughter, introduced role, and explained reason for consult. Patient's daughter confirmed plan for residential hospice facility. She is unfamiliar with facilities and referral process. Patient and her daughter live in [place]. Will start with Brianna [name]. Discussed that if it takes too long to get a bed, will need to look at other facilities and [facility] are likely the next closest. Patient's daughter became tearful at the end of the conversation. CSW provided emotional support. CSW made Brianna [name] referral to Brianna [name]. No further concerns. CSW encouraged patient's daughter to contact CSW as needed. CSW will continue to follow patient and her daughter for support and facilitate discharge to hospice facility when bed  available.  4:31 pm: Brianna Ree MD told admissions coordinator patient is not eligible. They will continue to follow for further decline. CSW paged MD to notify.  Employment status:  Retired Forensic scientist:  Medicare PT Recommendations:  Not assessed at this time [place] / Referral to community resources:  Other (Comment Required)(Hospice facilities)  Patient/Family's Response to care:  Patient only oriented to self. Patient's daughter agreeable to hospice facility. Patient's daughter and husband supportive and involved in patient's care. Patient's daughter appreciated social work intervention.  Patient/Family's Understanding of and Emotional Response to Diagnosis, Current Treatment, and Prognosis:  Patient only oriented to self. Patient's daughter has a good understanding of the reason for admission and plan for hospice facility once bed available. Patient's daughter appears happy with [name] care.  Emotional Assessment Appearance:  Appears stated age Attitude/Demeanor/Rapport:  Unable to Assess Affect (typically observed):  Unable to Assess Orientation:  Oriented to Self Alcohol / Substance use:  Never Used Psych involvement (Current and /or in the community):  No (Comment)  Discharge Needs  Concerns to be addressed:  Care Coordination Readmission within the last 30 days:  No Current discharge risk:  Cognitively Impaired, Terminally ill Barriers to Discharge:  Continued Medical Work up, Requiring sitter/restraints   Brianna Chroman, LCSW 07/28/2017, 3:17 PM

## 2017-07-29 DIAGNOSIS — F0391 Unspecified dementia with behavioral disturbance: Secondary | ICD-10-CM

## 2017-07-29 NOTE — Progress Notes (Signed)
PT Cancellation Note  Patient Details Name: Brianna [name] MRN: [id number] DOB: 05/13/1932   Cancelled Treatment:    Reason Eval/Treat Not Completed: PT screened, no needs identified, will sign off. Chart reviewed: plan is now for pt to DC home with hospice. Pt is mobilizing with nursing currently. No skilled PT needs at this time. PT signing off at this time. Please place no consult if the patient's disposition changes.   10:35 AM, 07/29/17 Etta Grandchild, PT, DPT Physical Therapist - [place] [phone] (Pager)  [phone] (Office)     [name] C 07/29/2017, 10:34 AM

## 2017-07-29 NOTE — Progress Notes (Signed)
Nutrition Brief Note  Chart reviewed. Pt now transitioning to comfort care.  No further nutrition interventions warranted at this time.  Please re-consult as needed.   Gaia Gullikson A. Mateusz Neilan, RD, LDN, CDE Pager: [phone] After hours Pager: [phone]  

## 2017-07-29 NOTE — Progress Notes (Signed)
Patient has rested quietly in bed today, with sitter at bedside.  Denied pain when assessed during intentional rounding today.  Poor PO intake.  Foods and liquids offered during rounding.

## 2017-07-29 NOTE — Plan of Care (Signed)
  Problem: Nutrition: Goal: Adequate nutrition will be maintained Outcome: Progressing   Problem: Elimination: Goal: Will not experience complications related to bowel motility Outcome: Progressing   Problem: Skin Integrity: Goal: Risk for impaired skin integrity will decrease Outcome: Progressing   

## 2017-07-29 NOTE — Progress Notes (Signed)
PROGRESS NOTE  Brianna [name] AXE:[id number] DOB: [birth year] DOA: 07/24/2017 PCP: [name], MD  HPI  Brianna [name] is a 84 y.o. year old female with medical history significant for severe dementia, HTN, Hypothyroidism, depression who presented on 07/24/2017 with witnessed syncopal episode and was found to have severe hypokalemia.  Per conversation with her daughter Brianna [name]. Her mother was sitting on her portable commode for several minutes when suddenly she became limp and her head dropped. She was not responsive to her daughter so she called 911 immediately.  When EMS arrived she was still not responsive to them. She is unsure of what they did as she was asked to step out of the room. Prior to this episode she was in her usual state of health.  She drinks about 1 L of caffeine free pepsi a day which she cut back on after a similar episode that was attributed to her poor intake related to hypokalemia at that time. Her daughter admits her diet consists mainly of junk food (honey buns) and sandwiches ( chicken salad). She lives with her husband; her daughter lives next door.    Denies any new medications, no diarrhea, vomiting. She does take miralax but not everyday.   Hospital Course Throughout hospitalization patient potassium levels have continued to fluctuate but remaining low.  Patient's mental status at baseline is irritable and confused alert only intermittently to self.  Patient has decline all nutrition and now declines lab blood draws and medications.  Palliative care was consulted and goals of care discussion with daughter on 6/30 she was made DNR.  On my discussion on 7/1 with daughter Brianna [name] she elected to pursue hospice care given her mother's end-stage dementia.  We discussed her limitations in treating her low potassium given her persistent confusion related to progression of her dementia and no identifiable reversible causes.   ROS:    Subjective Refusing blood draws  and all food  Assessment/Plan: Active Problems:   Hypertension   Dementia   Alzheimer's dementia with behavioral disturbance   Syncope   Depression   Palliative care encounter  Severe Hypokalemia, fluctuating. Likely multifactorial with some GI loss which seems resolved now that miralax d/c'd. Poor diet likely contributing as well. Patient is severely demented and is refusing all lab draws and food currently her daughter to pursue residential hospice care.  Social work consult will be placed to assist.  Transient Sinus Bradycardia, resolved. Likely related to hypokalemia.   Prolonged QTC. Mg wnl. Persists, however, K is still low.   Syncopal episode. Occurred while using the bathroom per daughter. Likely related to hypokalemia. Though vasovagal syncope is also a possibility. No arrhythmias on telemetry monitoring. Troponin negative. EKG with sinus bradycardia initially.  Severe dementia with behavioral disturbances.  Seems like this is end stage given little to no oral intake the last few days in addition to severely poor mental status at baseline ( doesn't recognize daughter, who is primary caregiver), and poor quality of life even at home.  As above daughter wishes to pursue residential hospice care.  Social worker assisting in hospice care  Depression. Home effexor  HTN, somewhat improved. Very limited as with patient's poor mental status she is declining all medication. Intermittently allows [place] to give home BP meds.  Continue to monitor  Hypothyroidism. TSH wnl.  She declining oral medications  Code Status: After discussion with Daughter Brianna [name] at bedside this morning.  Will pursue residential hospice placement.  Social worker consult placed.  Continue  comfort care measures here.  Family Communication: Spoke with daughter at bedside  Disposition Plan: Social worker assisting in residential hospice, patient with no oral intake in setting of severe end-stage  dementia.  Consultants: Palliative  Procedures:  none  Antimicrobials: Anti-infectives (From admission, onward)   None        Cultures: none    Telemetry:  DVT prophylaxis: lovenox   Objective: Vitals:   07/28/17 1239 07/28/17 2036 07/28/17 2115 07/29/17 0617  BP: (!) 159/77   (!) 187/73  Pulse: 67   (!) 53  Resp: 20 18  18   Temp: 98.4 F (36.9 C)   98.6 F (37 C)  TempSrc: Axillary   Axillary  SpO2: 98%   99%  Weight:    47.6 kg (105 lb)  Height:   4\' 11"  (1.499 m)     Intake/Output Summary (Last 24 hours) at 07/29/2017 1034 Last data filed at 07/29/2017 0557 Gross per 24 hour  Intake 297 ml  Output -  Net 297 ml   Filed Weights   07/26/17 0529 07/27/17 0654 07/29/17 0617  Weight: 49 kg (108 lb) 48.5 kg (107 lb) 47.6 kg (105 lb)    Exam:  Constitutional: frail, elderly female, lying in bed in no distress Cardiovascular: Regular rate and rhythm, no appreciable murmurs rubs or gallops no peripheral edema Respiratory: Normal respiratory effort on room air  Skin: Scattered healed scars and abrasions.   Neurologic: Grossly no focal neuro deficit.  Psychiatric: Calm, alert to self and place Data Reviewed: CBC: Recent Labs  Lab 07/24/17 1652  WBC 9.9  HGB 12.3  HCT 38.1  MCV 84.3  PLT 448   Basic Metabolic Panel: Recent Labs  Lab 07/24/17 1652 07/25/17 0329 07/25/17 0701 07/25/17 1035 07/26/17 0453 07/26/17 0722 07/26/17 1917  NA 141 142 140 141 139  --  140  K <2.0* 2.9* 3.7 3.3* 2.9*  --  3.1*  CL 99 105 107 104 103  --  105  CO2 28 27 19* 29 26  --  26  GLUCOSE 114* 83 88 101* 74  --  103*  BUN 11 6* 6* <5* <5*  --  <5*  CREATININE 1.05* 0.73 0.75 0.76 0.80  --  0.90  CALCIUM 9.3 8.8* 8.7* 9.1 8.9  --  9.1  MG 1.9  --   --   --   --  1.8  --    GFR: Estimated Creatinine Clearance: 32.3 mL/min (by C-G formula based on SCr of 0.9 mg/dL). Liver Function Tests: Recent Labs  Lab 07/24/17 1652  AST 14*  ALT 6  ALKPHOS 64  BILITOT  0.7  PROT 7.1  ALBUMIN 3.3*   No results for input(s): LIPASE, AMYLASE in the last 168 hours. No results for input(s): AMMONIA in the last 168 hours. Coagulation Profile: No results for input(s): INR, PROTIME in the last 168 hours. Cardiac Enzymes: Recent Labs  Lab 07/24/17 1652 07/25/17 2348 07/26/17 0453  TROPONINI <0.03 <0.03 <0.03   BNP (last 3 results) No results for input(s): PROBNP in the last 8760 hours. HbA1C: No results for input(s): HGBA1C in the last 72 hours. CBG: Recent Labs  Lab 07/24/17 1657  GLUCAP 109*   Lipid Profile: No results for input(s): CHOL, HDL, LDLCALC, TRIG, CHOLHDL, LDLDIRECT in the last 72 hours. Thyroid Function Tests: No results for input(s): TSH, T4TOTAL, FREET4, T3FREE, THYROIDAB in the last 72 hours. Anemia Panel: No results for input(s): VITAMINB12, FOLATE, FERRITIN, TIBC, IRON, RETICCTPCT in the last  72 hours. Urine analysis:    Component Value Date/Time   COLORURINE STRAW (A) 01/06/2017 1430   APPEARANCEUR CLEAR 01/06/2017 1430   LABSPEC 1.004 (L) 01/06/2017 1430   PHURINE 6.0 01/06/2017 1430   GLUCOSEU NEGATIVE 01/06/2017 1430   HGBUR SMALL (A) 01/06/2017 1430   BILIRUBINUR NEGATIVE 01/06/2017 1430   KETONESUR NEGATIVE 01/06/2017 1430   PROTEINUR NEGATIVE 01/06/2017 1430   UROBILINOGEN 0.2 02/04/2014 1509   NITRITE NEGATIVE 01/06/2017 1430   LEUKOCYTESUR NEGATIVE 01/06/2017 1430   Sepsis Labs: @LABRCNTIP (procalcitonin:4,lacticidven:4)  )No results found for this or any previous visit (from the past 240 hour(s)).    Studies: No results found.  Scheduled Meds: . losartan  100 mg Oral Daily    Continuous Infusions: . sodium chloride Stopped (07/25/17 0140)     LOS: 1 day     Desiree Hane, MD [facility] Pager [phone]  If 7PM-7AM, please contact night-coverage [email or web address] Password [facility] 07/29/2017, 10:34 AM

## 2017-07-29 NOTE — Clinical Social Work Note (Addendum)
Per [facility] coordinator, their MD says patient is still not eligible.  Dayton Scrape, [place] [phone]  12:22 pm CSW notified patient's daughter that she is not eligible for [facility]. She is agreeable to making referral to [place]. CSW made referral to [facility].  Dayton Scrape, [place] [phone]  1:13 pm Cherie has spoken with patient's daughter and will meet with her tomorrow at 10:00 am to discuss admission and complete paperwork. Patient's daughter will bring patient's husband. He reportedly has dementia and does better in the mornings.  Dayton Scrape, [place]

## 2017-07-30 NOTE — Progress Notes (Signed)
Patient is alert and confused and hyper restless. Gave PRN haldol

## 2017-07-30 NOTE — Clinical Social Work Note (Signed)
Patient can discharge to [facility] once discharge orders and summary are in. CSW paged MD to notify.  Dayton Scrape, [place]

## 2017-07-30 NOTE — Progress Notes (Signed)
Gave report to Marcene Brawn at [facility].

## 2017-07-30 NOTE — Discharge Summary (Signed)
Physician Discharge Summary  Brianna [name] QIO:[id number] DOB: 09-05-1932 DOA: 07/24/2017  PCP: Adrian Prince, MD  Admit date: 07/24/2017 Discharge date: 07/30/2017  Time spent: 35 minutes  Recommendations for Outpatient Follow-up:  1. Comfort care per hospice   Discharge Diagnoses:  Active Problems:   Hypertension   Dementia   Alzheimer's dementia with behavioral disturbance   Syncope   Depression   Palliative care encounter   Discharge Condition: stable  Filed Weights   07/27/17 0654 07/29/17 0617 07/30/17 0558  Weight: 48.5 kg (107 lb) 47.6 kg (105 lb) 45.8 kg (101 lb)    History of present illness:  Brianna [name] is Brianna [name] 84 y.o. year old female with medical history significant for severe dementia, HTN, Hypothyroidism,depression who presented on 07/24/2017 with witnessed syncopal episode and was found to have severe hypokalemia.  Per conversation with her daughter Brianna [name]. Her mother was sitting on her portable commode for several minutes when suddenly she became limp and her head dropped. She was not responsive to her daughter so she called 911 immediately.  When EMS arrived she was still not responsive to them. She is unsure of what they did as she was asked to step out of the room. Prior to this episode she was in her usual state of health.  She drinks about 1 L of caffeine free pepsi Brianna [name] day which she cut back on after Brianna [name] similar episode that was attributed to her poor intake related to hypokalemia at that time. Her daughter admits her diet consists mainly of junk food (honey buns) and sandwiches ( chicken salad). She lives with her husband; her daughter lives next door.    Denies any new medications, no diarrhea, vomiting. She does take miralax but not everyday.   Throughout hospitalization patient potassium levels have continued to fluctuate but remaining low.  Patient's mental status at baseline is irritable and confused alert only intermittently to self.  Patient has  decline all nutrition and now declines lab blood draws and medications.  Palliative care was consulted and goals of care discussion with daughter on 6/30 she was made DNR.  On 7/1, family decided to pursue hospice care given pt's end stage dementia.   Hospital Course:   Goals of care:  Family has decided to pursue hospice care with patient's poor PO intake and severe dementia with behavioral disturbances.  Discharge to hospice today.  Severe Hypokalemia, fluctuating. Likely multifactorial with some GI loss which seems resolved now that miralax d/c'd. Poor diet likely contributing as well. Patient is severely demented and is refusing all lab draws and food currently her daughter to pursue residential hospice care.  Social work consult will be placed to assist.  Transient Sinus Bradycardia, resolved. Likely related to hypokalemia.   Prolonged QTC. Mg wnl. Persists, however, K is still low.   Syncopal episode. Occurred while using the bathroom per daughter. ?related to hypokalemia. Though vasovagal syncope is also Brianna [name] possibility. No arrhythmias on telemetry monitoring. Troponin negative. EKG with sinus bradycardia initially.  Severe dementia with behavioral disturbances.  Seems like this is end stage given little to no oral intake the last few days in addition to severely poor mental status at baseline, and poor quality of life even at home.  As above daughter wishes to pursue residential hospice care.  Social worker assisting in hospice care  Depression. Home effexor  HTN - BP's still elevated she's declining PO meds.  Continue to follow.  Has IV hydral prn.  Continue PO as able.  Hypothyroidism. TSH wnl.  She declining oral medications.  Continue home synthroid as able.  Procedures:  none  Consultations:  Palliative care  Discharge Exam: Vitals:   07/30/17 0558 07/30/17 0850  BP: (!) 175/75 (!) 178/120  Pulse: (!) 54 99  Resp: 18 20  Temp: 98.2 F (36.8 C)   SpO2: 97%  94%   Confused.  No c/o pain. Discussed with daughter, granddaughter, husband.  Confirm plan for residential hospice.  She hasn't eaten well.  General: No acute distress. Cardiovascular: Heart sounds show Brianna [name] regular rate, and rhythm. No gallops or rubs. No murmurs. No JVD. Lungs: Clear to auscultation bilaterally with good air movement. Abdomen: Soft, nontender, nondistended with normal active bowel sounds. Neurological: Disoriented. Moves all extremities 4. Cranial nerves II through XII grossly intact. Skin: Warm and dry. No rashes or lesions. Extremities: No clubbing or cyanosis. No edema.  Psychiatric: . Insight and judgment are impaire.   Discharge Instructions   Discharge Instructions    Diet - low sodium heart healthy   Complete by:  As directed    Increase activity slowly   Complete by:  As directed      Allergies as of 07/30/2017      Reactions   Penicillins Other (See Comments)   "didn't sit well" Has patient had Brianna [name] PCN reaction causing immediate rash, facial/tongue/throat swelling, SOB or lightheadedness with hypotension: No Has patient had Brianna [name] PCN reaction causing severe rash involving mucus membranes or skin necrosis: No Has patient had Brianna [name] PCN reaction that required hospitalization No Has patient had Brianna [name] PCN reaction occurring within the last 10 years: No If all of the above answers are "NO", then may proceed with Cephalosporin use.      Medication List    TAKE these medications   esomeprazole 40 MG capsule Commonly known as:  NEXIUM Take 40 mg by mouth every morning.   haloperidol 1 MG tablet Commonly known as:  HALDOL Take 1 mg by mouth daily as needed for agitation. #7- for severe agitation   levothyroxine 100 MCG tablet Commonly known as:  SYNTHROID, LEVOTHROID Take 100 mcg by mouth daily before breakfast.   losartan 100 MG tablet Commonly known as:  COZAAR Take 100 mg by mouth daily.   nisoldipine 17 MG 24 hr tablet Commonly known as:  SULAR Take 17  mg by mouth every morning.   NU-IRON 150 MG capsule Generic drug:  iron polysaccharides Take 150 mg by mouth daily.   PRISTIQ 50 MG 24 hr tablet Generic drug:  desvenlafaxine Take 50 mg by mouth every morning.   traZODone 50 MG tablet Commonly known as:  DESYREL Take 25 mg by mouth at bedtime.   Vitamin D (Ergocalciferol) 50000 units Caps capsule Commonly known as:  DRISDOL Take 50,000 Units by mouth every 7 (seven) days. Tuesday      Allergies  Allergen Reactions  . Penicillins Other (See Comments)    "didn't sit well" Has patient had Michayla Mcneil PCN reaction causing immediate rash, facial/tongue/throat swelling, SOB or lightheadedness with hypotension: No Has patient had Evanie Buckle PCN reaction causing severe rash involving mucus membranes or skin necrosis: No Has patient had Esbeydi Manago PCN reaction that required hospitalization No Has patient had P[place]en Goza PCN reaction occurring within the last 10 years: No If all of the above answers are "NO", then may proceed with Cephalosporin use.   Follow-up Information    Adrian Prince, MD.   Specialty:  Endocrinology Contact information: [address] [place] [address] [phone]  The results of significant diagnostics from this hospitalization (including imaging, microbiology, ancillary and laboratory) are listed below for reference.    Significant Diagnostic Studies: Dg Chest Portable 1 View  Result Date: 07/24/2017 CLINICAL DATA:  Syncope EXAM: PORTABLE CHEST 1 VIEW COMPARISON:  01/06/2017 FINDINGS: The patient is slightly rotated on the study. Stable cardiomegaly with aortic atherosclerosis. Clear lungs without acute pulmonary consolidation or CHF. Osteoarthritis of the right AC and glenohumeral joints. IMPRESSION: Cardiomegaly with aortic atherosclerosis. No active pulmonary disease. Electronically Signed   By: Tollie Eth M.D.   On: 07/24/2017 19:34    Microbiology: No results found for this or any previous visit (from the past  240 hour(s)).   Labs: Basic Metabolic Panel: Recent Labs  Lab 07/24/17 1652 07/25/17 0329 07/25/17 0701 07/25/17 1035 07/26/17 0453 07/26/17 0722 07/26/17 1917  NA 141 142 140 141 139  --  140  K <2.0* 2.9* 3.7 3.3* 2.9*  --  3.1*  CL 99 105 107 104 103  --  105  CO2 28 27 19* 29 26  --  26  GLUCOSE 114* 83 88 101* 74  --  103*  BUN 11 6* 6* <5* <5*  --  <5*  CREATININE 1.05* 0.73 0.75 0.76 0.80  --  0.90  CALCIUM 9.3 8.8* 8.7* 9.1 8.9  --  9.1  MG 1.9  --   --   --   --  1.8  --    Liver Function Tests: Recent Labs  Lab 07/24/17 1652  AST 14*  ALT 6  ALKPHOS 64  BILITOT 0.7  PROT 7.1  ALBUMIN 3.3*   No results for input(s): LIPASE, AMYLASE in the last 168 hours. No results for input(s): AMMONIA in the last 168 hours. CBC: Recent Labs  Lab 07/24/17 1652  WBC 9.9  HGB 12.3  HCT 38.1  MCV 84.3  PLT 298   Cardiac Enzymes: Recent Labs  Lab 07/24/17 1652 07/25/17 2348 07/26/17 0453  TROPONINI <0.03 <0.03 <0.03   BNP: BNP (last 3 results) No results for input(s): BNP in the last 8760 hours.  ProBNP (last 3 results) No results for input(s): PROBNP in the last 8760 hours.  CBG: Recent Labs  Lab 07/24/17 1657  GLUCAP 109*       Signed:  Lacretia Nicks MD.  [facility] 07/30/2017, 12:02 PM

## 2017-07-30 NOTE — Consult Note (Signed)
   [facility] CM Inpatient Consult   07/30/2017  Brianna [name] 1932-04-20 [id number]  Patient screened for potential [place] [facility] services. Patient is in the [facility] [place] services under patient's Medicare plan.  Chart reviewed and patient is being assessed by Hospice for post hospital care needs. Patient would have needs met with [facility] will follow progress and disposition. For questions contact:   Natividad Brood, RN BSN [facility] Liaison  [phone] business mobile phone Toll free office [phone]

## 2017-07-30 NOTE — Clinical Social Work Note (Signed)
CSW facilitated patient discharge including contacting patient family and facility to confirm patient discharge plans. Clinical information faxed to facility and family agreeable with plan. CSW arranged ambulance transport via [facility] to [place] at 1:00 pm. RN has already called report to facility RN.  CSW will sign off for now as social work intervention is no longer needed. Please consult [place] again if new needs arise.  Dayton Scrape, [place]

## 2017-07-30 NOTE — Consult Note (Signed)
[facility]:  Met with pt's daughter [name] husband and grand daughter. Explained hospice philosophy and Hospice home services. They are in agreement to the comfort approach. The pt is a DNR and family agrees that this is her wishes. Spoke to our Market researcher and she does agree pt is appropriate. Notified Felicita Gage who will contact MD and arrange ambulance transport. RN aware of plan as well. Thank you for the referral and the opportunity to serve our community. Webb Silversmith RN [phone]

## 2017-08-28 DEATH — deceased

## 2018-04-04 IMAGING — CT CT HEAD W/O CM
3 of 4 series · 15 of 47 positions shown, 18 images · non-contrast
Comparison: Head CT scan 03/08/2015.

CLINICAL DATA: Altered mental status.  Increased aggression.

EXAM:
CT HEAD WITHOUT CONTRAST
TECHNIQUE: Contiguous axial images were obtained from the base of the skull
through the vertex without intravenous contrast.

[Series 2: head w/o · axial · non-contrast · 0.38mm/px · z∈[-6,+114]mm · 9 of 30 slices shown, 12 images]
[im 3/30  brain]
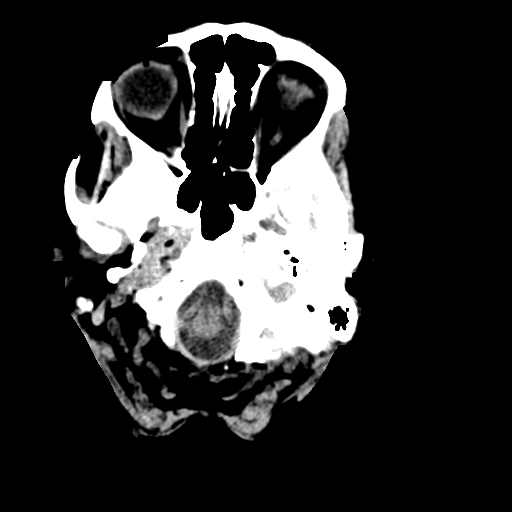
[im 3/30  bone]
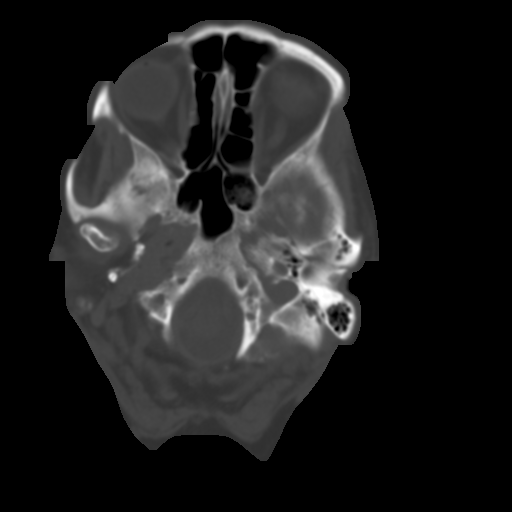
[im 7/30  brain]
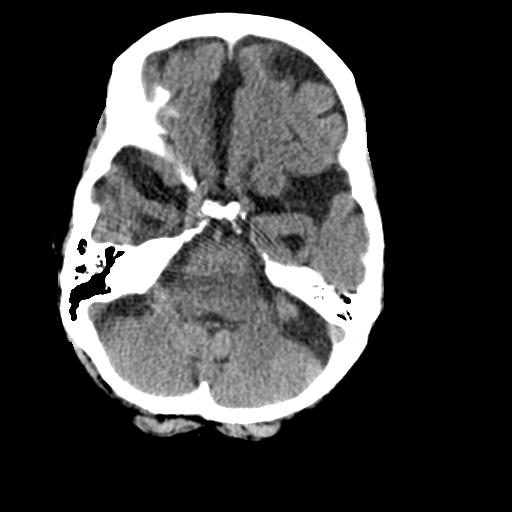
[im 9/30  brain]
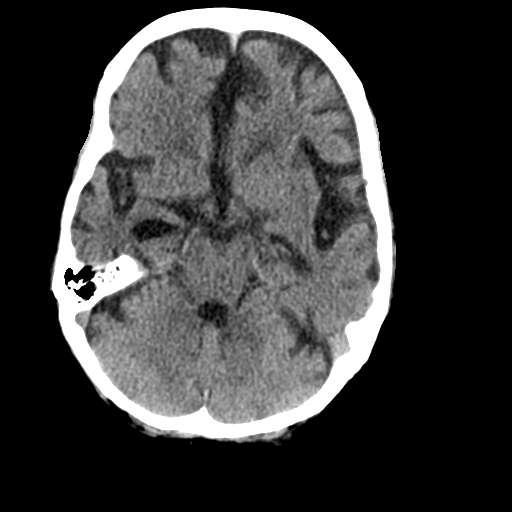
[im 13/30  brain]
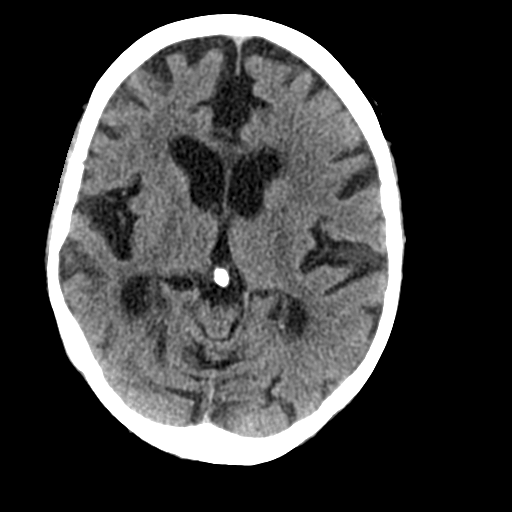
[im 15/30  brain]
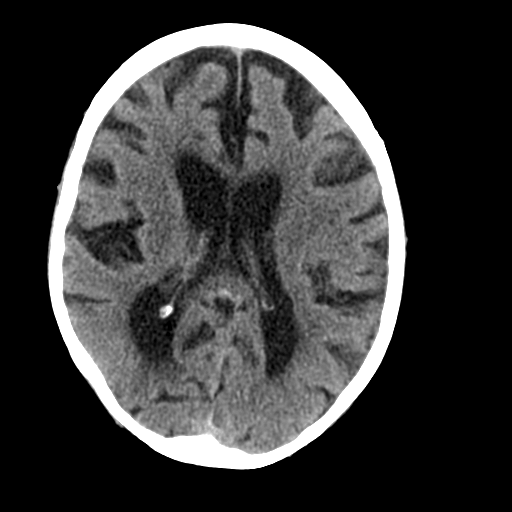
[im 15/30  bone]
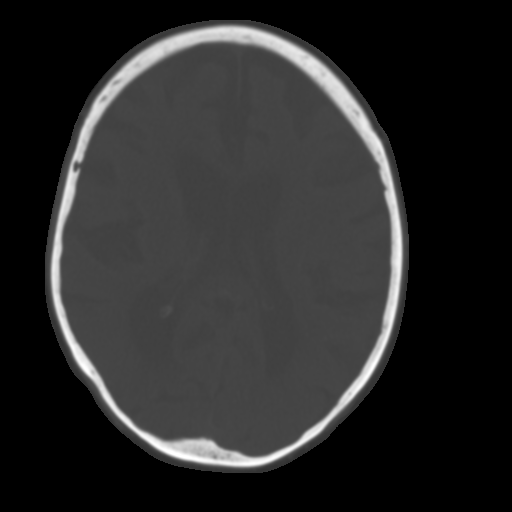
[im 17/30  brain]
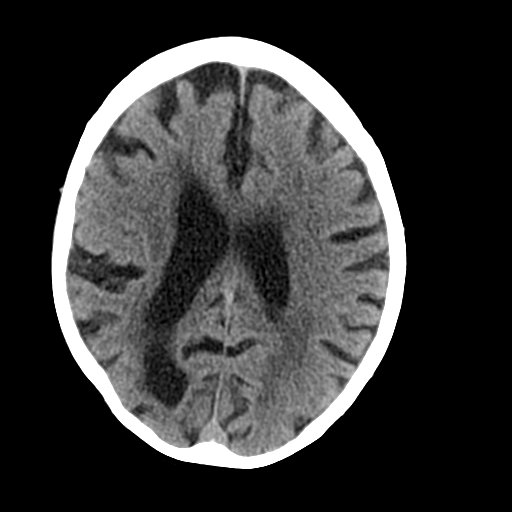
[im 21/30  brain]
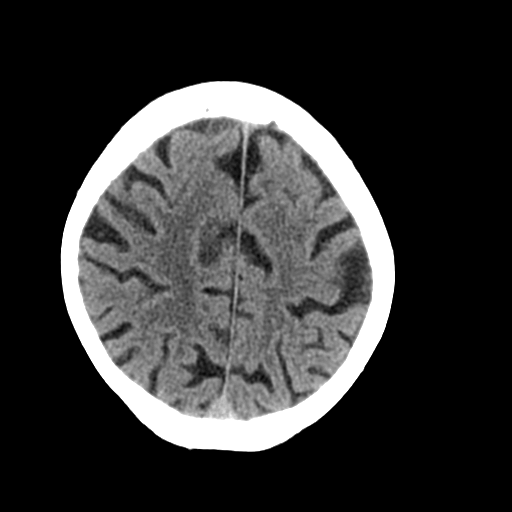
[im 23/30  brain]
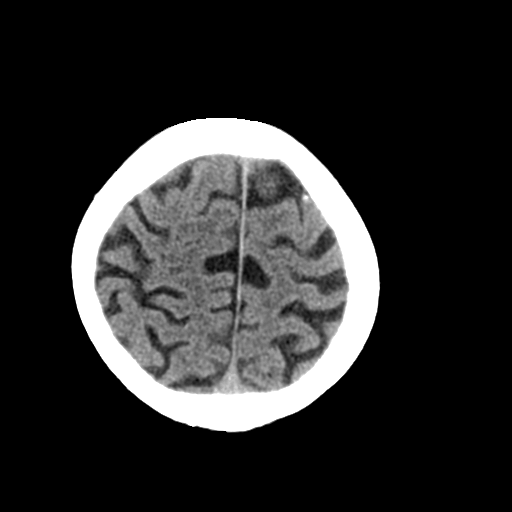
[im 27/30  brain]
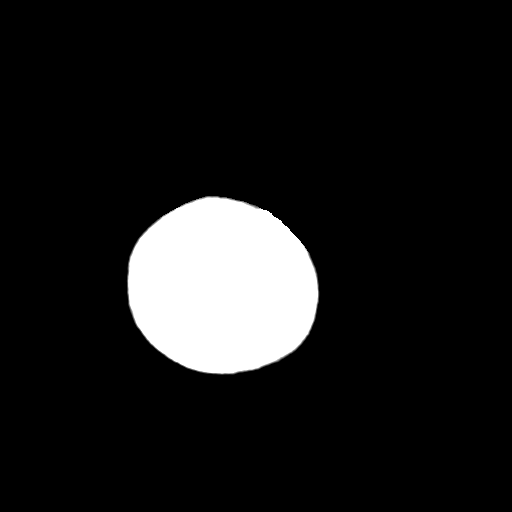
[im 27/30  bone]
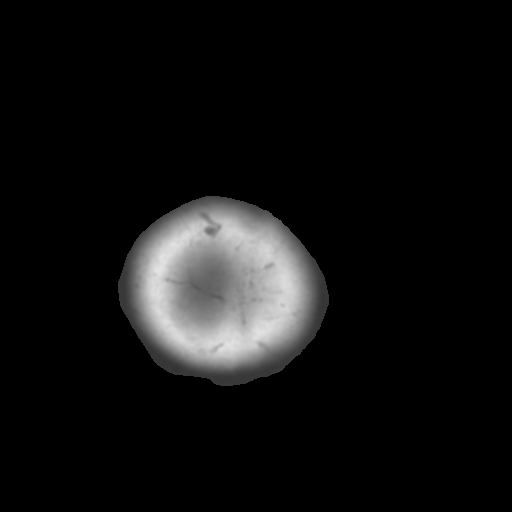

[Series 4: coronal · coronal · 0.28mm/px · 3 of 62 slices shown]
[im 21/62  brain]
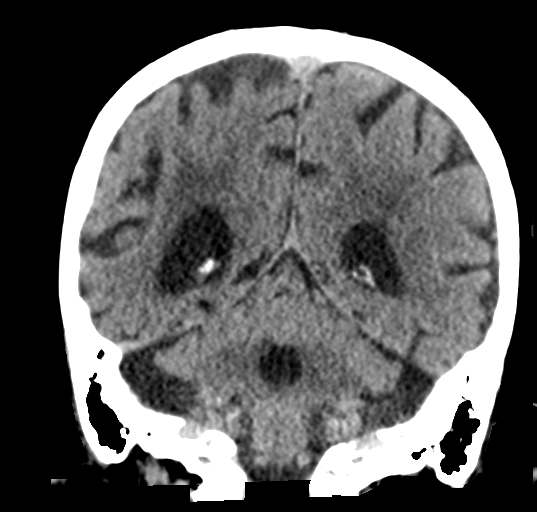
[im 28/62  brain]
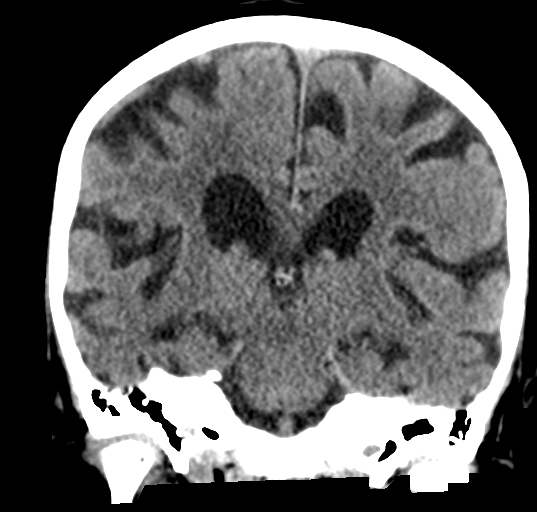
[im 34/62  brain]
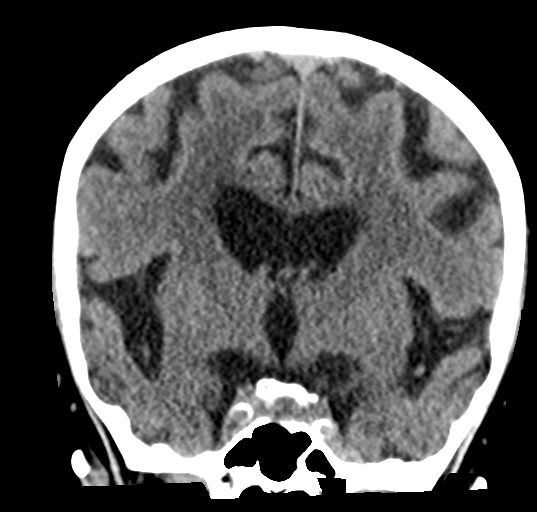

[Series 5: sagittal · sagittal · 0.28mm/px · 3 of 51 slices shown]
[im 17/51  brain]
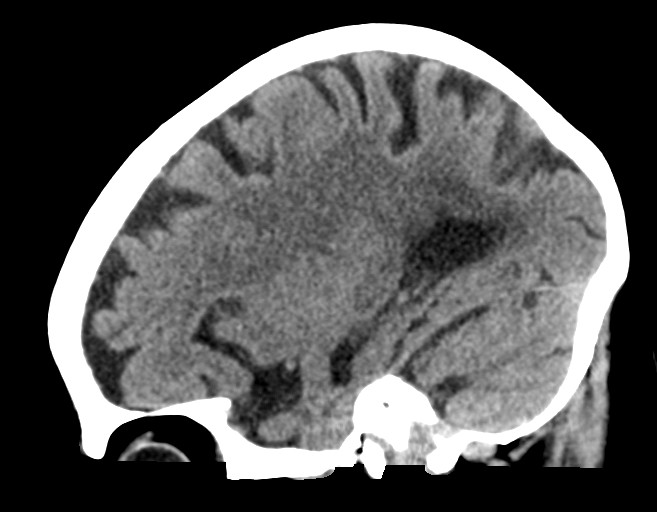
[im 26/51  brain]
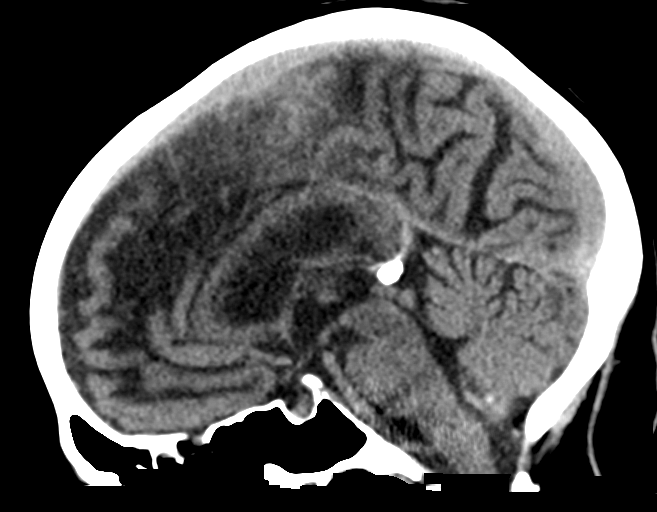
[im 34/51  brain]
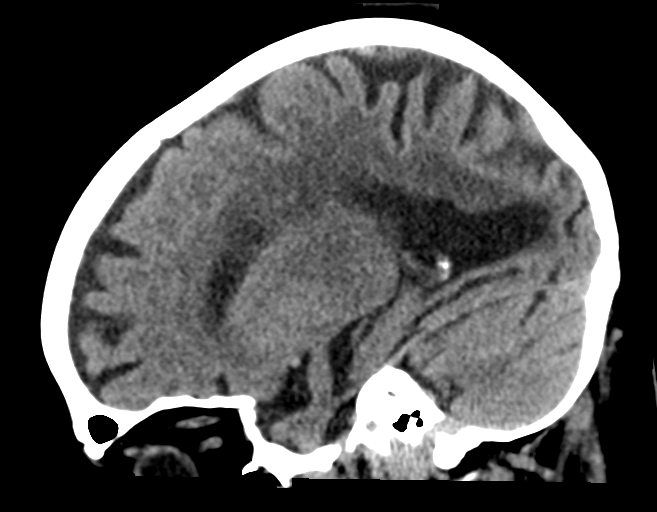

[15 of 47 positions shown; findings below may reference images not displayed]

FINDINGS: There is cortical atrophy and chronic microvascular ischemic change,
stable in appearance. No evidence of acute intracranial abnormality
including hemorrhage, infarct, mass lesion, mass effect, midline
shift or abnormal extra-axial fluid collection. No hydrocephalus or
pneumocephalus. There are some frothy secretions in the left
sphenoid sinus. Imaged paranasal sinuses are otherwise clear.
IMPRESSION: No acute abnormality.

No change in atrophy and chronic microvascular ischemic change.

Mild appearing left sphenoid sinus disease.

## 2019-08-25 IMAGING — DX DG CHEST 1V
1 series · 1 of 1 positions shown · non-contrast
Comparison: Portable exam 6232 hours compared to 06/24/2016

CLINICAL DATA: Confusion, uncooperative, altered mental status,
history hypertension, dementia

EXAM:
CHEST 1 VIEW

[chest ap]
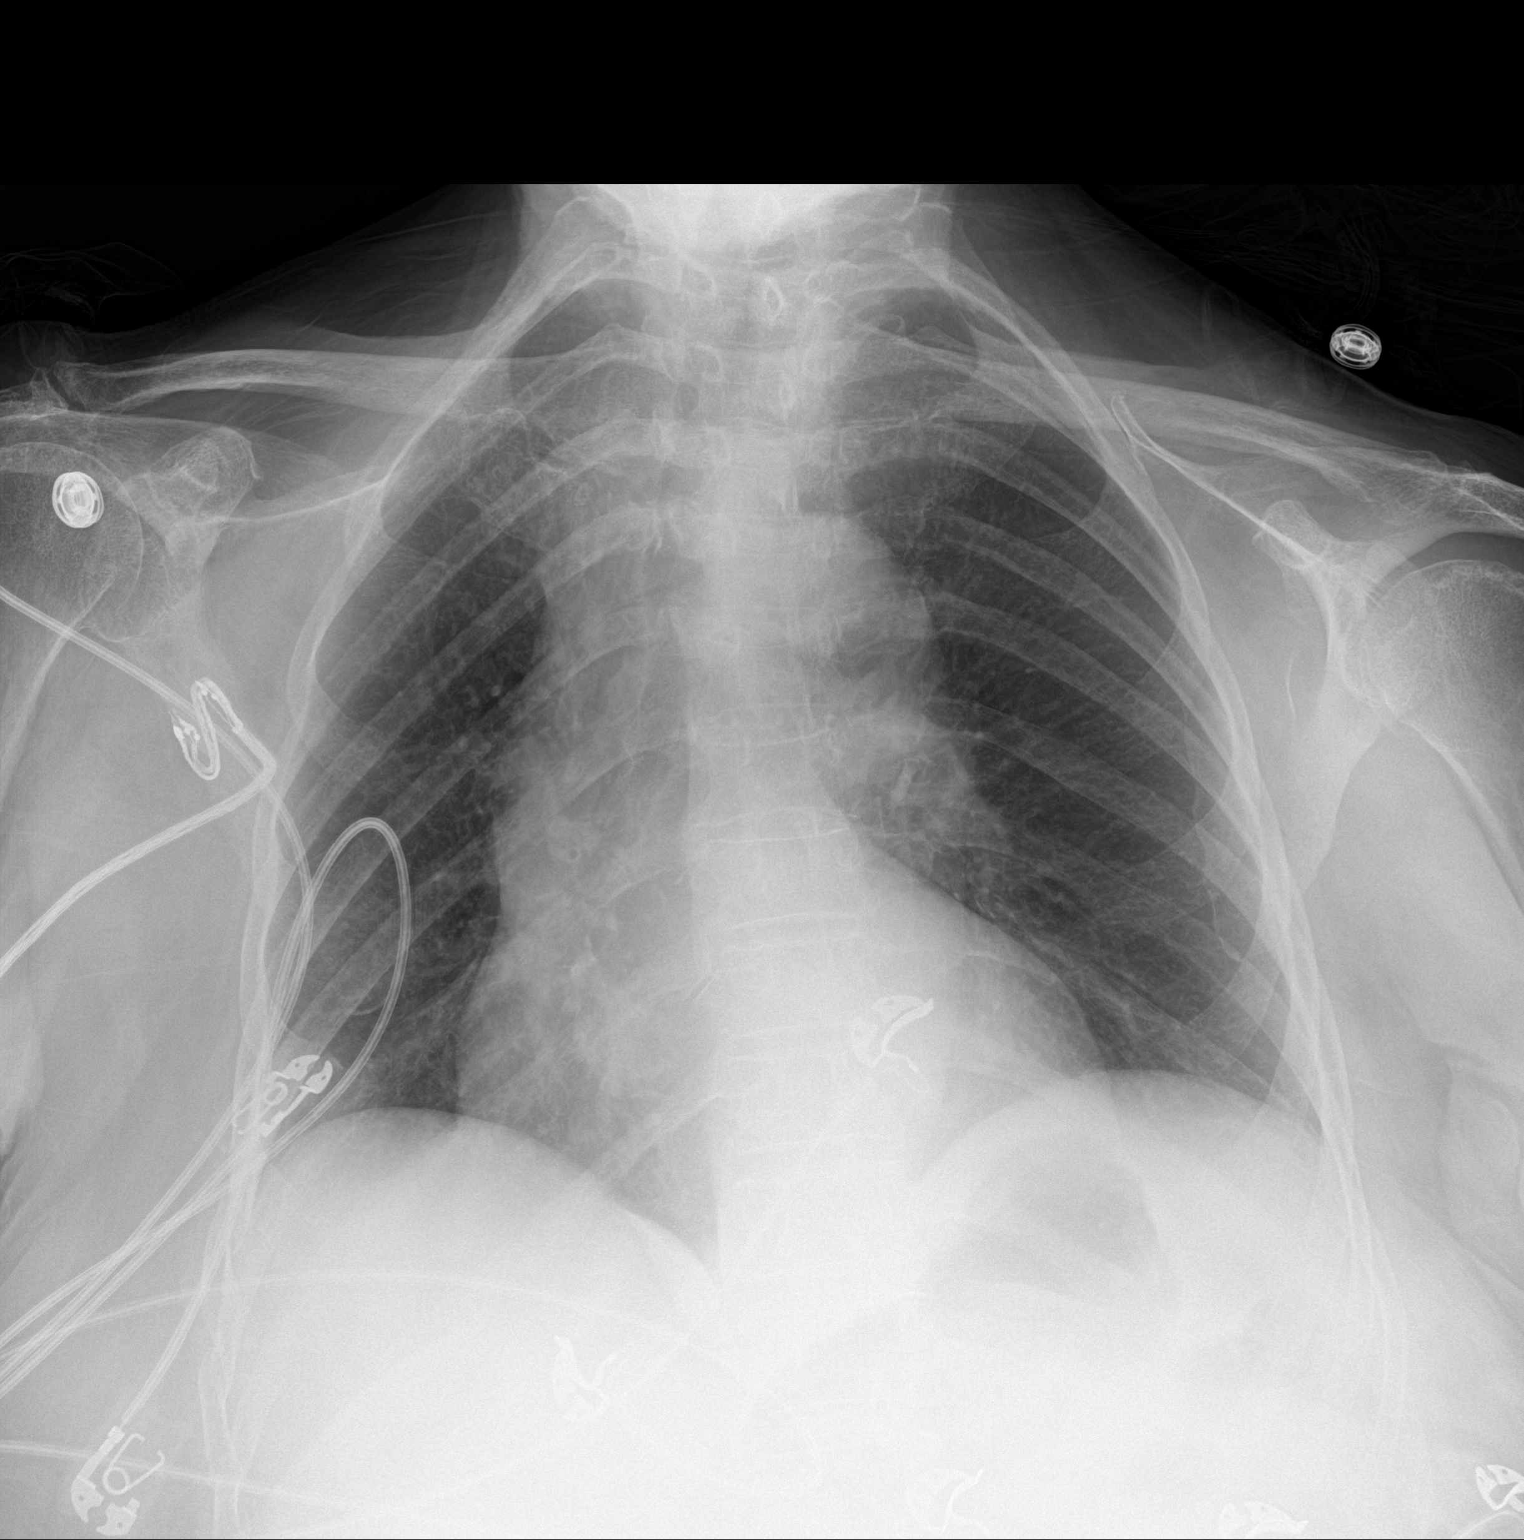

[1 of 1 positions shown; findings below may reference images not displayed]

FINDINGS: Upper normal size of cardiac silhouette.

Mediastinal contours and pulmonary vascularity normal.

Lungs clear.

No pleural effusion or pneumothorax.

Bones unremarkable.
IMPRESSION: No acute abnormalities.
